# Patient Record
Sex: Male | Born: 1945 | Race: White | Hispanic: No | Marital: Married | Smoking: Former smoker
Health system: Southern US, Community
[De-identification: ages and names within clinical notes are randomized; demographics above are authoritative.]

## PROBLEM LIST (undated history)

## (undated) DIAGNOSIS — G2581 Restless legs syndrome: Secondary | ICD-10-CM

## (undated) DIAGNOSIS — E119 Type 2 diabetes mellitus without complications: Secondary | ICD-10-CM

## (undated) DIAGNOSIS — M1990 Unspecified osteoarthritis, unspecified site: Secondary | ICD-10-CM

## (undated) DIAGNOSIS — I219 Acute myocardial infarction, unspecified: Secondary | ICD-10-CM

## (undated) DIAGNOSIS — I2510 Atherosclerotic heart disease of native coronary artery without angina pectoris: Secondary | ICD-10-CM

## (undated) DIAGNOSIS — M79671 Pain in right foot: Secondary | ICD-10-CM

## (undated) DIAGNOSIS — G4700 Insomnia, unspecified: Principal | ICD-10-CM

## (undated) DIAGNOSIS — F329 Major depressive disorder, single episode, unspecified: Secondary | ICD-10-CM

## (undated) DIAGNOSIS — I10 Essential (primary) hypertension: Secondary | ICD-10-CM

## (undated) DIAGNOSIS — G629 Polyneuropathy, unspecified: Secondary | ICD-10-CM

## (undated) DIAGNOSIS — M4807 Spinal stenosis, lumbosacral region: Secondary | ICD-10-CM

## (undated) DIAGNOSIS — F32A Depression, unspecified: Secondary | ICD-10-CM

## (undated) DIAGNOSIS — M109 Gout, unspecified: Secondary | ICD-10-CM

## (undated) DIAGNOSIS — I639 Cerebral infarction, unspecified: Secondary | ICD-10-CM

## (undated) DIAGNOSIS — M79672 Pain in left foot: Secondary | ICD-10-CM

## (undated) HISTORY — PX: EYE SURGERY: SHX253

## (undated) HISTORY — PX: BACK SURGERY: SHX140

## (undated) HISTORY — DX: Pain in right foot: M79.671

## (undated) HISTORY — DX: Insomnia, unspecified: G47.00

## (undated) HISTORY — DX: Spinal stenosis, lumbosacral region: M48.07

## (undated) HISTORY — DX: Type 2 diabetes mellitus without complications: E11.9

## (undated) HISTORY — PX: HAND SURGERY: SHX662

## (undated) HISTORY — DX: Restless legs syndrome: G25.81

## (undated) HISTORY — DX: Pain in right foot: M79.672

## (undated) HISTORY — PX: CARDIAC CATHETERIZATION: SHX172

## (undated) HISTORY — DX: Gout, unspecified: M10.9

## (undated) HISTORY — PX: CORONARY ARTERY BYPASS GRAFT: SHX141

## (undated) HISTORY — DX: Atherosclerotic heart disease of native coronary artery without angina pectoris: I25.10

## (undated) HISTORY — DX: Acute myocardial infarction, unspecified: I21.9

## (undated) HISTORY — PX: HERNIA REPAIR: SHX51

---

## 2003-10-24 ENCOUNTER — Ambulatory Visit: Payer: Self-pay | Admitting: Pain Medicine

## 2003-11-01 ENCOUNTER — Ambulatory Visit: Payer: Self-pay | Admitting: Pain Medicine

## 2003-12-12 ENCOUNTER — Ambulatory Visit: Payer: Self-pay | Admitting: Pain Medicine

## 2004-04-25 ENCOUNTER — Ambulatory Visit: Payer: Self-pay | Admitting: Pain Medicine

## 2004-07-16 ENCOUNTER — Ambulatory Visit: Payer: Self-pay | Admitting: Pain Medicine

## 2004-07-31 ENCOUNTER — Ambulatory Visit: Payer: Self-pay | Admitting: Pain Medicine

## 2004-09-10 ENCOUNTER — Ambulatory Visit: Payer: Self-pay | Admitting: Pain Medicine

## 2004-10-15 ENCOUNTER — Ambulatory Visit: Payer: Self-pay | Admitting: Pain Medicine

## 2004-10-30 ENCOUNTER — Ambulatory Visit: Payer: Self-pay | Admitting: Pain Medicine

## 2004-11-12 ENCOUNTER — Ambulatory Visit: Payer: Self-pay | Admitting: Pain Medicine

## 2004-11-28 ENCOUNTER — Ambulatory Visit: Payer: Self-pay | Admitting: Pain Medicine

## 2004-12-31 ENCOUNTER — Ambulatory Visit: Payer: Self-pay | Admitting: Pain Medicine

## 2005-02-05 ENCOUNTER — Ambulatory Visit: Payer: Self-pay | Admitting: Pain Medicine

## 2005-03-06 ENCOUNTER — Ambulatory Visit: Payer: Self-pay | Admitting: Pain Medicine

## 2005-04-01 ENCOUNTER — Ambulatory Visit: Payer: Self-pay | Admitting: Pain Medicine

## 2005-04-16 ENCOUNTER — Ambulatory Visit: Payer: Self-pay | Admitting: Neurology

## 2005-05-02 ENCOUNTER — Encounter: Admission: RE | Admit: 2005-05-02 | Discharge: 2005-05-02 | Payer: Self-pay | Admitting: Neurology

## 2005-05-19 ENCOUNTER — Encounter: Admission: RE | Admit: 2005-05-19 | Discharge: 2005-05-19 | Payer: Self-pay | Admitting: Neurology

## 2005-09-02 ENCOUNTER — Encounter: Admission: RE | Admit: 2005-09-02 | Discharge: 2005-09-02 | Payer: Self-pay | Admitting: Neurology

## 2006-04-27 ENCOUNTER — Encounter: Admission: RE | Admit: 2006-04-27 | Discharge: 2006-04-27 | Payer: Self-pay | Admitting: Neurology

## 2006-05-13 ENCOUNTER — Encounter: Admission: RE | Admit: 2006-05-13 | Discharge: 2006-05-13 | Payer: Self-pay | Admitting: Neurology

## 2006-09-06 ENCOUNTER — Emergency Department: Payer: Self-pay | Admitting: Emergency Medicine

## 2006-09-06 ENCOUNTER — Other Ambulatory Visit: Payer: Self-pay

## 2008-09-04 ENCOUNTER — Observation Stay: Payer: Self-pay | Admitting: Cardiology

## 2008-10-16 ENCOUNTER — Emergency Department: Payer: Self-pay | Admitting: Unknown Physician Specialty

## 2009-03-06 ENCOUNTER — Ambulatory Visit: Payer: Self-pay | Admitting: Otolaryngology

## 2009-03-15 ENCOUNTER — Ambulatory Visit: Payer: Self-pay | Admitting: Otolaryngology

## 2009-08-06 ENCOUNTER — Ambulatory Visit: Payer: Self-pay | Admitting: Otolaryngology

## 2009-08-23 ENCOUNTER — Ambulatory Visit: Payer: Self-pay | Admitting: Otolaryngology

## 2010-02-10 ENCOUNTER — Encounter: Payer: Self-pay | Admitting: Neurology

## 2010-09-10 ENCOUNTER — Ambulatory Visit: Payer: Self-pay | Admitting: General Surgery

## 2011-11-14 ENCOUNTER — Other Ambulatory Visit: Payer: Self-pay | Admitting: Neurosurgery

## 2011-11-14 DIAGNOSIS — M5410 Radiculopathy, site unspecified: Secondary | ICD-10-CM

## 2011-11-14 DIAGNOSIS — M549 Dorsalgia, unspecified: Secondary | ICD-10-CM

## 2011-11-18 ENCOUNTER — Ambulatory Visit
Admission: RE | Admit: 2011-11-18 | Discharge: 2011-11-18 | Disposition: A | Discharge status: Home / Self Care | Payer: Medicare Other | Source: Ambulatory Visit | Attending: Neurosurgery | Admitting: Neurosurgery

## 2011-11-18 VITALS — BP 164/77 | HR 82 | Ht 71.0 in | Wt 215.0 lb

## 2011-11-18 DIAGNOSIS — M549 Dorsalgia, unspecified: Secondary | ICD-10-CM

## 2011-11-18 DIAGNOSIS — M5410 Radiculopathy, site unspecified: Secondary | ICD-10-CM

## 2011-11-18 MED ORDER — IOHEXOL 180 MG/ML  SOLN: 15.0000 mL | Freq: Once | INTRAMUSCULAR | Status: AC | PRN

## 2011-11-18 MED ORDER — DIAZEPAM 5 MG PO TABS
10.0000 mg | ORAL_TABLET | Freq: Once | ORAL | Status: AC
  Administered 2011-11-18: 10 mg via ORAL

## 2011-11-18 MED ORDER — MEPERIDINE HCL 100 MG/ML IJ SOLN
75.0000 mg | Freq: Once | INTRAMUSCULAR | Status: AC
  Administered 2011-11-18: 75 mg via INTRAMUSCULAR

## 2011-11-18 MED ORDER — ONDANSETRON HCL 4 MG/2ML IJ SOLN
4.0000 mg | Freq: Once | INTRAMUSCULAR | Status: AC
  Administered 2011-11-18: 4 mg via INTRAMUSCULAR

## 2011-11-18 NOTE — Progress Notes (Signed)
Patient states he has been off Plavix the past ten days and Cymbalta the past four days.  Donell Sievert, RN

## 2011-11-19 ENCOUNTER — Other Ambulatory Visit: Payer: Self-pay | Admitting: Neurosurgery

## 2011-11-19 ENCOUNTER — Encounter (HOSPITAL_COMMUNITY): Payer: Self-pay | Admitting: Pharmacy Technician

## 2011-11-20 NOTE — Pre-Procedure Instructions (Signed)
20 Erion Lovan  11/20/2011   Your procedure is scheduled on: Tuesday, November 5th   Report to [facility] at  1:30 PM. (as per surgeon's instructions)   Call this number if you have problems the morning of surgery: [phone]   Remember:   Do not eat food or drink any liquids:After Midnight Monday going into Tuesday.    Take these medicines the morning of surgery with A SIP OF WATER: Cymbalta, vicodin, metoprolol    Do not wear jewelry.  Do not wear lotions, powders, or colognes.  You [place] wear NOT deodorant.             Men Lupe shave face and neck.   Do not bring valuables to the hospital.  Contacts, dentures or bridgework Vitolo not be worn into surgery.   Leave suitcase in the car. After surgery it Bresee be brought to your room.  For patients admitted to the hospital, checkout time is 11:00 AM the day of discharge.   Patients discharged the day of surgery will not be allowed to drive home.   Name and phone number of your driver:    Special Instructions: Shower using CHG 2 nights before surgery and the night before surgery.  If you shower the day of surgery use CHG.  Use special wash - you have one bottle of CHG for all showers.  You should use approximately 1/3 of the bottle for each shower.   Please read over the following fact sheets that you were given: Pain Booklet, MRSA Information and Surgical Site Infection Prevention

## 2011-11-21 ENCOUNTER — Encounter (HOSPITAL_COMMUNITY): Payer: Self-pay | Admitting: Vascular Surgery

## 2011-11-21 ENCOUNTER — Encounter (HOSPITAL_COMMUNITY)
Admission: RE | Admit: 2011-11-21 | Discharge: 2011-11-21 | Disposition: A | Discharge status: Home / Self Care | Payer: Medicare Other | Source: Ambulatory Visit | Attending: Neurosurgery | Admitting: Neurosurgery

## 2011-11-21 ENCOUNTER — Encounter (HOSPITAL_COMMUNITY): Payer: Self-pay

## 2011-11-21 HISTORY — DX: Major depressive disorder, single episode, unspecified: F32.9

## 2011-11-21 HISTORY — DX: Polyneuropathy, unspecified: G62.9

## 2011-11-21 HISTORY — DX: Unspecified osteoarthritis, unspecified site: M19.90

## 2011-11-21 HISTORY — DX: Cerebral infarction, unspecified: I63.9

## 2011-11-21 HISTORY — DX: Depression, unspecified: F32.A

## 2011-11-21 LAB — COMPREHENSIVE METABOLIC PANEL
Alkaline Phosphatase: 59 U/L (ref 39–117)
Calcium: 10.9 mg/dL — ABNORMAL HIGH (ref 8.4–10.5)
Creatinine, Ser: 0.88 mg/dL (ref 0.50–1.35)
GFR calc Af Amer: >90 mL/min (ref >90)
GFR calc non Af Amer: 89 mL/min — ABNORMAL LOW (ref >90)
Total Bilirubin: 0.3 mg/dL (ref 0.3–1.2)
Total Protein: 7.5 g/dL (ref 6.0–8.3)

## 2011-11-21 LAB — CBC
HCT: 40 % (ref 39.0–52.0)
Hemoglobin: 14 g/dL (ref 13.0–17.0)
MCH: 34.4 pg — ABNORMAL HIGH (ref 26.0–34.0)
MCHC: 35 g/dL (ref 30.0–36.0)
WBC: 9.5 10*3/uL (ref 4.0–10.5)

## 2011-11-21 NOTE — Consult Note (Addendum)
Anesthesia consult: Patient is a 66 year old male scheduled for L4-5 discectomy, cages, pedicle screws, posterior lateral arthrodesis on 11/25/2011 by Dr. Jeral Fruit. 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  PCP is Dr. Sela Hua.  Cardiologist is Dr. Lady Gary at [facility].  Currently, he does not think either are aware of his planned surgery.    I saw patient today during his PAT visit due to cardiac history and elevated BP at his PAT visit.  Specifically, BP readings showed 208/104, 191/82, 204/81.  Just before he left his PAT visit BP was rechecked and was 198/99 (LUE) and 190/83 (RUE).  He is on metoprolol 12.5 mg BID.  He reports that his BP has never been this high.  He is not feeling very well today due to lack of sleep and feeling wired since starting his steroid taper yesterday.  He had a headache last night, which has since resolved.  He denies any visual changes.  He denies chest pain, shortness of breath, or significant edema.  He has not been able to be particularly active due due to his significant back and BLE pain.  He is taking Vicodin PRN, but it is not completely relieving the pain.  Exam show his heart with a RRR, no murmur, lungs clear, no carotid bruits noted.  Trace pre-tibial edema.  He was emotional regarding his decline in physical activity since his CABG and now even more so with back issues.  His EKG on 11/21/11 showed NSR, voltage criteria for LVH, ST/T wave abnormality, consider ischemia.  Currently, there are no comparison EKGs available.  Chest x-ray on 11/21/2011 showed no active disease, monitor changes of the thoracic spine.  CBC and CMET from 11/21/11 noted.  He will need a T&S on arrival.  Patient is aware that his BP should be better controlled  prior to his surgery.  He says these readings are really unheard of for him.  He was quite emotional, having some pain, and feeling jittery since starting his steroid taper.  These factors could all be playing a role.  He is going to have his BP rechecked by Monday 11/24/11 and was advised to consult with Dr. Arlana Pouch or Dr. Lady Gary.  He is aware that I will have to review his latest cardiology and PCP notes to determine if any additional work-up will be required from an anesthesia standpoint.  I will need to ensure his EKG is stable.  I reviewed with Anesthesiologist Dr. Michelle Piper who agreed with this plan so far.  I also updated Jessica at Dr. Cassandria Santee office.  She asked Mr. Kassel to contact her on Monday for an update regarding his hypertension.  I'll follow-up when requested records are available.  Shonna Chock, PA-C  11/21/11 1540   Addendum: 11/24/11 1025 Reviewed additional records.  Cardiac cath from 09/05/08 [facility]) showed a 90% ostial LAD lesion and normal CX and RCA.  He underwent minimally invasive direct CABG with LIMA to LAD on 09/11/08 (Dr. Jomarie Longs, [facility]).  His last visit with Cardiologist Dr. Lady Gary was on 06/20/11.  He was felt, "Stable, doing well from a cardiac standpoint.  No evidence of progression of disease clinically from his surgery.  Does have some chest wall pain but this is controlled.  No arrhythmia."  No new recommendations were made, and  6-8 months follow-up was recommended.     Echo on 10/17/08 [facility]) showed mild LV dysfunction, EF 50%, mild RV enlargement, mild LA enlargement, mild mitral insufficiency and moderate tricuspid insufficiency, mild pulmonary hypertension.    His EKG from 11/23/11 showed NSR, LVH, anterior ST/T wave abnormality, consider ischemia.  His most recent comparison EKG from [facility] has a lot of movement so it is difficult to compare anterior leads.  He did have anterior T wave abnormality noted on a prior EKG from [facility] from 10/16/08.  I  reviewed above records with Anesthesiologist Dr. Katrinka Blazing. If patient has no new CV symptoms and BP is reasonable then anticipate patient can proceed as planned.  Shanda Bumps called from Dr. Cassandria Santee office and did report that patient did see his PCP on Friday, 11/21/11, and his anti-hypertensive medication regimen was adjusted.  Reportedly, BP 176/92 and 152/82 since then.)

## 2011-11-21 NOTE — Progress Notes (Signed)
1300.Marland KitchentHIS PATIENT HAS HAD STROKE IN PAST...HE HAS A HEART CATH IN Sep 04, 2008 BY dR. kENNETH FATH (CARDIO) FROM [facility] .HAVE REQUESTED LAST OFFICE NOTES, ECHO & EKG ON THIS PT. [phone]     . AFTER HAVING THE CATH, HE HAD A CABG X 1 (WENT IN THRU THE SIDE) DONE AT [facility]. I AM ALSO REQUESTING ANY AND ALL NOTES FROM HIS PCP-=--DR DENNY TATE IN [place], [place]. [phone])....DA

## 2011-11-24 MED ORDER — CEFAZOLIN SODIUM-DEXTROSE 2-3 GM-% IV SOLR
2.0000 g | INTRAVENOUS | Status: AC
  Administered 2011-11-25: 2 g via INTRAVENOUS
  Filled 2011-11-24: qty 50

## 2011-11-25 ENCOUNTER — Inpatient Hospital Stay (HOSPITAL_COMMUNITY): Payer: Medicare Other

## 2011-11-25 ENCOUNTER — Encounter (HOSPITAL_COMMUNITY): Payer: Self-pay | Admitting: Vascular Surgery

## 2011-11-25 ENCOUNTER — Inpatient Hospital Stay (HOSPITAL_COMMUNITY): Payer: Medicare Other | Admitting: Vascular Surgery

## 2011-11-25 ENCOUNTER — Inpatient Hospital Stay (HOSPITAL_COMMUNITY)
Admission: RE | Admit: 2011-11-25 | Discharge: 2011-12-03 | DRG: 460 | Disposition: A | Discharge status: Rehabilitation Facility | Payer: Medicare Other | Source: Ambulatory Visit | Attending: Neurosurgery | Admitting: Neurosurgery

## 2011-11-25 ENCOUNTER — Encounter (HOSPITAL_COMMUNITY)
Admission: RE | Disposition: A | Discharge status: Rehabilitation Facility | Payer: Self-pay | Source: Ambulatory Visit | Attending: Neurosurgery

## 2011-11-25 ENCOUNTER — Encounter (HOSPITAL_COMMUNITY): Payer: Self-pay | Admitting: Surgery

## 2011-11-25 DIAGNOSIS — Z7902 Long term (current) use of antithrombotics/antiplatelets: Secondary | ICD-10-CM

## 2011-11-25 DIAGNOSIS — M5137 Other intervertebral disc degeneration, lumbosacral region: Principal | ICD-10-CM | POA: Diagnosis present

## 2011-11-25 DIAGNOSIS — Z01812 Encounter for preprocedural laboratory examination: Secondary | ICD-10-CM

## 2011-11-25 DIAGNOSIS — M48062 Spinal stenosis, lumbar region with neurogenic claudication: Secondary | ICD-10-CM | POA: Diagnosis present

## 2011-11-25 DIAGNOSIS — Y849 Medical procedure, unspecified as the cause of abnormal reaction of the patient, or of later complication, without mention of misadventure at the time of the procedure: Secondary | ICD-10-CM | POA: Diagnosis not present

## 2011-11-25 DIAGNOSIS — M51379 Other intervertebral disc degeneration, lumbosacral region without mention of lumbar back pain or lower extremity pain: Principal | ICD-10-CM | POA: Diagnosis present

## 2011-11-25 DIAGNOSIS — Z951 Presence of aortocoronary bypass graft: Secondary | ICD-10-CM

## 2011-11-25 DIAGNOSIS — G988 Other disorders of nervous system: Secondary | ICD-10-CM | POA: Diagnosis not present

## 2011-11-25 HISTORY — PX: LUMBAR FUSION: SHX111

## 2011-11-25 HISTORY — DX: Essential (primary) hypertension: I10

## 2011-11-25 LAB — TYPE AND SCREEN
ABO/RH(D): B POS
Antibody Screen: NEGATIVE

## 2011-11-25 LAB — ABO/RH: ABO/RH(D): B POS

## 2011-11-25 SURGERY — POSTERIOR LUMBAR FUSION 1 LEVEL
Anesthesia: General | Site: Spine Lumbar | Wound class: Clean

## 2011-11-25 MED ORDER — DIPHENHYDRAMINE HCL 50 MG/ML IJ SOLN: 12.5000 mg | Freq: Four times a day (QID) | INTRAMUSCULAR | Status: DC | PRN

## 2011-11-25 MED ORDER — HYDROMORPHONE HCL PF 1 MG/ML IJ SOLN
0.2500 mg | INTRAMUSCULAR | Status: DC | PRN
  Administered 2011-11-25 (×4): 0.5 mg via INTRAVENOUS

## 2011-11-25 MED ORDER — PROPOFOL 10 MG/ML IV BOLUS
INTRAVENOUS | Status: DC | PRN
  Administered 2011-11-25: 150 mg via INTRAVENOUS
  Administered 2011-11-25: 50 mg via INTRAVENOUS

## 2011-11-25 MED ORDER — LAMOTRIGINE 25 MG PO TABS
50.0000 mg | ORAL_TABLET | Freq: Two times a day (BID) | ORAL | Status: DC
  Administered 2011-11-26 – 2011-12-03 (×15): 50 mg via ORAL
  Filled 2011-11-25 (×18): qty 2

## 2011-11-25 MED ORDER — METOPROLOL SUCCINATE ER 25 MG PO TB24
25.0000 mg | ORAL_TABLET | Freq: Two times a day (BID) | ORAL | Status: DC
  Administered 2011-11-26 – 2011-12-03 (×15): 25 mg via ORAL
  Filled 2011-11-25 (×17): qty 1

## 2011-11-25 MED ORDER — PHENYLEPHRINE HCL 10 MG/ML IJ SOLN
INTRAMUSCULAR | Status: DC | PRN
  Administered 2011-11-25: 40 ug via INTRAVENOUS
  Administered 2011-11-25: 80 ug via INTRAVENOUS
  Administered 2011-11-25: 40 ug via INTRAVENOUS
  Administered 2011-11-25: 80 ug via INTRAVENOUS
  Administered 2011-11-25 (×2): 40 ug via INTRAVENOUS

## 2011-11-25 MED ORDER — DIAZEPAM 5 MG PO TABS
5.0000 mg | ORAL_TABLET | Freq: Four times a day (QID) | ORAL | Status: DC | PRN
  Administered 2011-11-26: 5 mg via ORAL
  Filled 2011-11-25: qty 1

## 2011-11-25 MED ORDER — DIPHENHYDRAMINE HCL 12.5 MG/5ML PO ELIX: 12.5000 mg | ORAL_SOLUTION | Freq: Four times a day (QID) | ORAL | Status: DC | PRN

## 2011-11-25 MED ORDER — ONDANSETRON HCL 4 MG/2ML IJ SOLN: 4.0000 mg | Freq: Once | INTRAMUSCULAR | Status: DC | PRN

## 2011-11-25 MED ORDER — VECURONIUM BROMIDE 10 MG IV SOLR
INTRAVENOUS | Status: DC | PRN
  Administered 2011-11-25 (×2): 1 mg via INTRAVENOUS
  Administered 2011-11-25: 2 mg via INTRAVENOUS
  Administered 2011-11-25: 8 mg via INTRAVENOUS
  Administered 2011-11-25 (×3): 2 mg via INTRAVENOUS

## 2011-11-25 MED ORDER — SODIUM CHLORIDE 0.9 % IJ SOLN: 9.0000 mL | INTRAMUSCULAR | Status: DC | PRN

## 2011-11-25 MED ORDER — ACETAMINOPHEN 650 MG RE SUPP: 650.0000 mg | RECTAL | Status: DC | PRN

## 2011-11-25 MED ORDER — LACTATED RINGERS IV SOLN
INTRAVENOUS | Status: DC | PRN
  Administered 2011-11-25 (×5): via INTRAVENOUS

## 2011-11-25 MED ORDER — HYDROMORPHONE HCL PF 1 MG/ML IJ SOLN
INTRAMUSCULAR | Status: AC
  Filled 2011-11-25: qty 1

## 2011-11-25 MED ORDER — NEOSTIGMINE METHYLSULFATE 1 MG/ML IJ SOLN
INTRAMUSCULAR | Status: DC | PRN
  Administered 2011-11-25: 4 mg via INTRAVENOUS

## 2011-11-25 MED ORDER — MORPHINE SULFATE (PF) 1 MG/ML IV SOLN
INTRAVENOUS | Status: AC
  Administered 2011-11-26: 06:00:00
  Filled 2011-11-25: qty 25

## 2011-11-25 MED ORDER — ZOLPIDEM TARTRATE 10 MG PO TABS: 10.0000 mg | ORAL_TABLET | Freq: Every evening | ORAL | Status: DC | PRN

## 2011-11-25 MED ORDER — MORPHINE SULFATE (PF) 1 MG/ML IV SOLN
INTRAVENOUS | Status: DC
  Administered 2011-11-25: 21:00:00 via INTRAVENOUS
  Administered 2011-11-26: 16.5 mg via INTRAVENOUS
  Administered 2011-11-26: 10.5 mg via INTRAVENOUS
  Administered 2011-11-26: 12:00:00 via INTRAVENOUS
  Administered 2011-11-26: 7.5 mg via INTRAVENOUS
  Administered 2011-11-26: 10.5 mg via INTRAVENOUS
  Administered 2011-11-27: 01:00:00 via INTRAVENOUS
  Administered 2011-11-27: 1.5 mg via INTRAVENOUS
  Filled 2011-11-25 (×3): qty 25

## 2011-11-25 MED ORDER — NALOXONE HCL 0.4 MG/ML IJ SOLN: 0.4000 mg | INTRAMUSCULAR | Status: DC | PRN

## 2011-11-25 MED ORDER — ARTIFICIAL TEARS OP OINT
TOPICAL_OINTMENT | OPHTHALMIC | Status: DC | PRN
  Administered 2011-11-25: 1 via OPHTHALMIC

## 2011-11-25 MED ORDER — GLYCOPYRROLATE 0.2 MG/ML IJ SOLN
INTRAMUSCULAR | Status: DC | PRN
Start: 2011-11-25 — End: 2011-11-25
  Administered 2011-11-25: 0.2 mg via INTRAVENOUS
  Administered 2011-11-25: 0.6 mg via INTRAVENOUS
  Administered 2011-11-25: 0.2 mg via INTRAVENOUS

## 2011-11-25 MED ORDER — SIMVASTATIN 40 MG PO TABS
40.0000 mg | ORAL_TABLET | Freq: Every day | ORAL | Status: DC
  Administered 2011-11-26 – 2011-12-02 (×7): 40 mg via ORAL
  Filled 2011-11-25 (×10): qty 1

## 2011-11-25 MED ORDER — MENTHOL 3 MG MT LOZG: 1.0000 | LOZENGE | OROMUCOSAL | Status: DC | PRN

## 2011-11-25 MED ORDER — SODIUM CHLORIDE 0.9 % IV SOLN
INTRAVENOUS | Status: DC | PRN
  Administered 2011-11-25: 20:00:00 via INTRAVENOUS

## 2011-11-25 MED ORDER — THROMBIN 20000 UNITS EX SOLR
CUTANEOUS | Status: DC | PRN
  Administered 2011-11-25: 18:00:00 via TOPICAL

## 2011-11-25 MED ORDER — ALBUMIN HUMAN 5 % IV SOLN
INTRAVENOUS | Status: DC | PRN
  Administered 2011-11-25 (×2): via INTRAVENOUS

## 2011-11-25 MED ORDER — DULOXETINE HCL 30 MG PO CPEP
30.0000 mg | ORAL_CAPSULE | Freq: Two times a day (BID) | ORAL | Status: DC
  Filled 2011-11-25: qty 2

## 2011-11-25 MED ORDER — FENOFIBRATE 160 MG PO TABS
160.0000 mg | ORAL_TABLET | Freq: Every day | ORAL | Status: DC
  Administered 2011-11-26 – 2011-12-03 (×8): 160 mg via ORAL
  Filled 2011-11-25 (×8): qty 1

## 2011-11-25 MED ORDER — CEFAZOLIN SODIUM 1-5 GM-% IV SOLN
1.0000 g | Freq: Three times a day (TID) | INTRAVENOUS | Status: AC
  Administered 2011-11-26 (×2): 1 g via INTRAVENOUS
  Filled 2011-11-25 (×2): qty 50

## 2011-11-25 MED ORDER — SODIUM CHLORIDE 0.9 % IV SOLN
INTRAVENOUS | Status: DC
  Administered 2011-11-26 – 2011-11-27 (×2): via INTRAVENOUS

## 2011-11-25 MED ORDER — ONDANSETRON HCL 4 MG/2ML IJ SOLN: 4.0000 mg | INTRAMUSCULAR | Status: DC | PRN

## 2011-11-25 MED ORDER — FENTANYL CITRATE 0.05 MG/ML IJ SOLN
INTRAMUSCULAR | Status: DC | PRN
  Administered 2011-11-25 (×8): 50 ug via INTRAVENOUS
  Administered 2011-11-25: 100 ug via INTRAVENOUS

## 2011-11-25 MED ORDER — SODIUM CHLORIDE 0.9 % IV SOLN: 250.0000 mL | INTRAVENOUS | Status: DC

## 2011-11-25 MED ORDER — DULOXETINE HCL 60 MG PO CPEP
60.0000 mg | ORAL_CAPSULE | Freq: Every day | ORAL | Status: DC
  Administered 2011-11-26 – 2011-12-03 (×8): 60 mg via ORAL
  Filled 2011-11-25 (×9): qty 1

## 2011-11-25 MED ORDER — ONDANSETRON HCL 4 MG/2ML IJ SOLN: 4.0000 mg | Freq: Four times a day (QID) | INTRAMUSCULAR | Status: DC | PRN

## 2011-11-25 MED ORDER — ACETAMINOPHEN 325 MG PO TABS
650.0000 mg | ORAL_TABLET | ORAL | Status: DC | PRN
  Administered 2011-11-29 – 2011-11-30 (×4): 650 mg via ORAL
  Administered 2011-11-30: 325 mg via ORAL
  Administered 2011-12-01 (×2): 650 mg via ORAL
  Filled 2011-11-25 (×3): qty 1
  Filled 2011-11-25 (×5): qty 2

## 2011-11-25 MED ORDER — ACETAMINOPHEN 10 MG/ML IV SOLN
INTRAVENOUS | Status: AC
  Filled 2011-11-25: qty 100

## 2011-11-25 MED ORDER — MIDAZOLAM HCL 5 MG/5ML IJ SOLN
INTRAMUSCULAR | Status: DC | PRN
  Administered 2011-11-25: 2 mg via INTRAVENOUS

## 2011-11-25 MED ORDER — 0.9 % SODIUM CHLORIDE (POUR BTL) OPTIME
TOPICAL | Status: DC | PRN
  Administered 2011-11-25: 1000 mL

## 2011-11-25 MED ORDER — PHENOL 1.4 % MT LIQD: 1.0000 | OROMUCOSAL | Status: DC | PRN

## 2011-11-25 MED ORDER — OXYCODONE-ACETAMINOPHEN 5-325 MG PO TABS
1.0000 | ORAL_TABLET | ORAL | Status: DC | PRN
  Administered 2011-11-26 – 2011-11-27 (×3): 2 via ORAL
  Filled 2011-11-25 (×3): qty 2

## 2011-11-25 MED ORDER — ONDANSETRON HCL 4 MG/2ML IJ SOLN
INTRAMUSCULAR | Status: DC | PRN
  Administered 2011-11-25: 4 mg via INTRAVENOUS

## 2011-11-25 MED ORDER — SODIUM CHLORIDE 0.9 % IJ SOLN: 3.0000 mL | INTRAMUSCULAR | Status: DC | PRN

## 2011-11-25 MED ORDER — ACETAMINOPHEN 10 MG/ML IV SOLN
1000.0000 mg | Freq: Once | INTRAVENOUS | Status: AC | PRN
  Administered 2011-11-25: 1000 mg via INTRAVENOUS

## 2011-11-25 MED ORDER — DULOXETINE HCL 30 MG PO CPEP
30.0000 mg | ORAL_CAPSULE | Freq: Every day | ORAL | Status: DC
  Administered 2011-11-26 – 2011-12-03 (×7): 30 mg via ORAL
  Filled 2011-11-25 (×8): qty 1

## 2011-11-25 MED ORDER — SODIUM CHLORIDE 0.9 % IJ SOLN: 3.0000 mL | Freq: Two times a day (BID) | INTRAMUSCULAR | Status: DC

## 2011-11-25 SURGICAL SUPPLY — 72 items
ADH SKN CLS LQ APL DERMABOND (GAUZE/BANDAGES/DRESSINGS) ×1
BENZOIN TINCTURE PRP APPL 2/3 (GAUZE/BANDAGES/DRESSINGS) ×2 IMPLANT
BLADE SURG ROTATE 9660 (MISCELLANEOUS) IMPLANT
BONE EQUIVA 10CC (Bone Implant) ×2 IMPLANT
BUR ACORN 6.0 (BURR) ×2 IMPLANT
BUR MATCHSTICK NEURO 3.0 LAGG (BURR) ×2 IMPLANT
CANISTER SUCTION 2500CC (MISCELLANEOUS) ×2 IMPLANT
CAP REVERE LOCKING (Cap) ×8 IMPLANT
CLOTH BEACON ORANGE TIMEOUT ST (SAFETY) ×2 IMPLANT
CONT SPEC 4OZ CLIKSEAL STRL BL (MISCELLANEOUS) ×4 IMPLANT
COVER BACK TABLE 24X17X13 BIG (DRAPES) IMPLANT
COVER TABLE BACK 60X90 (DRAPES) ×2 IMPLANT
DERMABOND ADHESIVE PROPEN (GAUZE/BANDAGES/DRESSINGS) ×1
DERMABOND ADVANCED .7 DNX6 (GAUZE/BANDAGES/DRESSINGS) ×1 IMPLANT
DRAPE C-ARM 42X72 X-RAY (DRAPES) ×6 IMPLANT
DRAPE LAPAROTOMY 100X72X124 (DRAPES) ×2 IMPLANT
DRAPE POUCH INSTRU U-SHP 10X18 (DRAPES) ×2 IMPLANT
DRSG PAD ABDOMINAL 8X10 ST (GAUZE/BANDAGES/DRESSINGS) ×4 IMPLANT
DURAPREP 26ML APPLICATOR (WOUND CARE) ×2 IMPLANT
DURASEAL SPINE SEALANT 3ML (MISCELLANEOUS) ×2 IMPLANT
ELECT BLADE 4.0 EZ CLEAN MEGAD (MISCELLANEOUS) ×2
ELECT REM PT RETURN 9FT ADLT (ELECTROSURGICAL) ×2
ELECTRODE BLDE 4.0 EZ CLN MEGD (MISCELLANEOUS) ×1 IMPLANT
ELECTRODE REM PT RTRN 9FT ADLT (ELECTROSURGICAL) ×1 IMPLANT
EVACUATOR 1/8 PVC DRAIN (DRAIN) IMPLANT
GAUZE SPONGE 4X4 16PLY XRAY LF (GAUZE/BANDAGES/DRESSINGS) ×2 IMPLANT
GLOVE BIO SURGEON STRL SZ 6.5 (GLOVE) ×10 IMPLANT
GLOVE BIOGEL M 8.0 STRL (GLOVE) ×4 IMPLANT
GLOVE BIOGEL PI IND STRL 7.0 (GLOVE) ×2 IMPLANT
GLOVE BIOGEL PI INDICATOR 7.0 (GLOVE) ×2
GLOVE ECLIPSE 8.5 STRL (GLOVE) ×2 IMPLANT
GLOVE EXAM NITRILE LRG STRL (GLOVE) IMPLANT
GLOVE EXAM NITRILE MD LF STRL (GLOVE) IMPLANT
GLOVE EXAM NITRILE XL STR (GLOVE) IMPLANT
GLOVE EXAM NITRILE XS STR PU (GLOVE) IMPLANT
GOWN BRE IMP SLV AUR LG STRL (GOWN DISPOSABLE) ×6 IMPLANT
GOWN BRE IMP SLV AUR XL STRL (GOWN DISPOSABLE) ×4 IMPLANT
GOWN STRL REIN 2XL LVL4 (GOWN DISPOSABLE) IMPLANT
KIT BASIN OR (CUSTOM PROCEDURE TRAY) ×2 IMPLANT
KIT ROOM TURNOVER OR (KITS) ×2 IMPLANT
MILL MEDIUM DISP (BLADE) ×2 IMPLANT
NEEDLE HYPO 18GX1.5 BLUNT FILL (NEEDLE) IMPLANT
NEEDLE HYPO 21X1.5 SAFETY (NEEDLE) IMPLANT
NEEDLE HYPO 25X1 1.5 SAFETY (NEEDLE) ×2 IMPLANT
NS IRRIG 1000ML POUR BTL (IV SOLUTION) ×2 IMPLANT
PACK LAMINECTOMY NEURO (CUSTOM PROCEDURE TRAY) ×2 IMPLANT
PAD ARMBOARD 7.5X6 YLW CONV (MISCELLANEOUS) ×6 IMPLANT
PATTIES SURGICAL .5 X1 (DISPOSABLE) ×2 IMPLANT
PATTIES SURGICAL .5 X3 (DISPOSABLE) IMPLANT
PATTIES SURGICAL 1X1 (DISPOSABLE) ×2 IMPLANT
PENCIL BUTTON HOLSTER BLD 10FT (ELECTRODE) ×2 IMPLANT
ROD REVERE 6.35 40MM (Rod) ×4 IMPLANT
SCREW REVERE 5.5X45 (Screw) ×8 IMPLANT
SPACER SUSTAIN O SM 8X22 10M (Spacer) ×4 IMPLANT
SPONGE GAUZE 4X4 12PLY (GAUZE/BANDAGES/DRESSINGS) ×2 IMPLANT
SPONGE LAP 4X18 X RAY DECT (DISPOSABLE) IMPLANT
SPONGE NEURO XRAY DETECT 1X3 (DISPOSABLE) IMPLANT
SPONGE SURGIFOAM ABS GEL 100 (HEMOSTASIS) ×2 IMPLANT
STRIP CLOSURE SKIN 1/2X4 (GAUZE/BANDAGES/DRESSINGS) ×2 IMPLANT
SUT PROLENE 6 0 BV (SUTURE) ×2 IMPLANT
SUT VIC AB 1 CT1 18XBRD ANBCTR (SUTURE) ×3 IMPLANT
SUT VIC AB 1 CT1 8-18 (SUTURE) ×6
SUT VIC AB 2-0 CP2 18 (SUTURE) ×2 IMPLANT
SUT VIC AB 3-0 SH 8-18 (SUTURE) ×2 IMPLANT
SYR 20CC LL (SYRINGE) IMPLANT
SYR 20ML ECCENTRIC (SYRINGE) ×2 IMPLANT
SYR 5ML LL (SYRINGE) IMPLANT
TAPE CLOTH SURG 4X10 WHT LF (GAUZE/BANDAGES/DRESSINGS) ×2 IMPLANT
TOWEL OR 17X24 6PK STRL BLUE (TOWEL DISPOSABLE) ×2 IMPLANT
TOWEL OR 17X26 10 PK STRL BLUE (TOWEL DISPOSABLE) ×2 IMPLANT
TRAY FOLEY CATH 14FRSI W/METER (CATHETERS) ×2 IMPLANT
WATER STERILE IRR 1000ML POUR (IV SOLUTION) ×2 IMPLANT

## 2011-11-25 NOTE — Preoperative (Signed)
Beta Blockers   Reason not to administer Beta Blockers:Not Applicable 

## 2011-11-25 NOTE — Anesthesia Postprocedure Evaluation (Signed)
  Anesthesia Post-op Note  Patient: [name] [name]  Procedure(s) Performed: Procedure(s) (LRB) with comments: POSTERIOR LUMBAR FUSION 1 LEVEL (N/A) - Lumbar four-five Diskectomy,cages, pedicle screws, posterolateral arthrodesis, cellsaver  Patient Location: PACU  Anesthesia Type:General  Level of Consciousness: awake  Airway and Oxygen Therapy: Patient Spontanous Breathing  Post-op Pain: mild  Post-op Assessment: Post-op Vital signs reviewed  Post-op Vital Signs: Reviewed  Complications: No apparent anesthesia complications

## 2011-11-25 NOTE — H&P (Signed)
[name] [name] is an 66 y.o. male.   Chief Complaint: lumbar pain HPI: patient seen with history of lumbar pain with radiation to both lower extremities which is getting worse up to the point his wife helps him to dress   Past Medical History  Diagnosis Date  . Arthritis   . Depression     STARTED AFTER  OPEN HEART SURG.  . Stroke     2008  RIGHT SIDE...HAD FOR A COUPLE OF DAYS AND THEN WENT AWAY  . Stroke     WENT TO [place]  2008  . Neuropathy     FIBRO        SEES  DR. Corrinne Eagle  . Coronary artery disease     Past Surgical History  Procedure Date  . Coronary artery bypass graft     (2010 @ [facility]  . Hand surgery     CRUSHING INJURY---1966  HAS ABOUT 6 SURGERIES THEN  . Hernia repair     BILATERAL  . Eye surgery     ?? BLEPHOPLASTY    No family history on file. Social History:  reports that he quit smoking about 5 years ago. He started smoking about 43 years ago. He does not have any smokeless tobacco history on file. He reports that he drinks about 7.2 ounces of alcohol per week. He reports that he does not use illicit drugs.  Allergies: No Known Allergies  No prescriptions prior to admission    No results found for this or any previous visit (from the past 48 hour(s)). No results found.  Review of Systems  Constitutional: Negative.   HENT: Negative.   Respiratory: Negative.   Cardiovascular:       Arterial hypertension  Gastrointestinal: Negative.   Genitourinary: Negative.   Musculoskeletal: Positive for back pain.  Skin: Negative.   Neurological: Positive for focal weakness.  Endo/Heme/Allergies: Negative.   Psychiatric/Behavioral: Positive for depression.    There were no vitals taken for this visit. Physical Exam hent, nl neck, nl. Lungs, clear. Cv, scra in midline from bypass surgery. Abdomen, nl. Extremities, nl. NEURO DORSIFLEXION OF FEET 3/5 DTR, 1 X. SLR POSITIVE AT 60 DEGREES. Myelogram showed severe stenosis at l4-5 with facet arthropathy at  l5-s1but no compromise of the s1 nerve roots. scoliosis  Assessment/Plan Decompression and fusion at l4-5 with cages and pedicles screws and pla. He and his wife are aware of risks and benefits  Destyn Parfitt M 11/25/2011, 10:01 AM

## 2011-11-25 NOTE — Transfer of Care (Signed)
Immediate Anesthesia Transfer of Care Note  Patient: [name] [name]  Procedure(s) Performed: Procedure(s) (LRB) with comments: POSTERIOR LUMBAR FUSION 1 LEVEL (N/A) - Lumbar four-five Diskectomy,cages, pedicle screws, posterolateral arthrodesis, cellsaver  Patient Location: PACU  Anesthesia Type:General  Level of Consciousness: awake, alert , oriented and sedated  Airway & Oxygen Therapy: Patient Spontanous Breathing  Post-op Assessment: Report given to PACU RN, Post -op Vital signs reviewed and stable and Patient moving all extremities  Post vital signs: Reviewed and stable  Complications: No apparent anesthesia complications

## 2011-11-25 NOTE — Anesthesia Preprocedure Evaluation (Signed)
Anesthesia Evaluation  Patient identified by MRN, date of birth, ID band Patient awake    Reviewed: Allergy & Precautions, H&P , NPO status , Patient's Chart, lab work & pertinent test results  Airway Mallampati: II TM Distance: >3 FB Neck ROM: Full    Dental  (+) Teeth Intact and Dental Advisory Given   Pulmonary  breath sounds clear to auscultation        Cardiovascular Rhythm:Regular     Neuro/Psych    GI/Hepatic   Endo/Other    Renal/GU      Musculoskeletal   Abdominal   Peds  Hematology   Anesthesia Other Findings   Reproductive/Obstetrics                           Anesthesia Physical Anesthesia Plan  ASA: III  Anesthesia Plan: General   Post-op Pain Management:    Induction: Intravenous  Airway Management Planned: Oral ETT  Additional Equipment:   Intra-op Plan:   Post-operative Plan: Extubation in OR  Informed Consent: I have reviewed the patients History and Physical, chart, labs and discussed the procedure including the risks, benefits and alternatives for the proposed anesthesia with the patient or authorized representative who has indicated his/her understanding and acceptance.   Dental advisory given  Plan Discussed with: CRNA and Surgeon  Anesthesia Plan Comments: (HNP L4-5 CA S/P CABGx1 at [facility] 09/05/2008 No angina Htn S/P CVA 2008 with mild R. Sided weakness as residual H/O smoking, quit 2008  Plan GA with oral ETT  Kipp Brood,  MD)        Anesthesia Quick Evaluation

## 2011-11-25 NOTE — H&P (Signed)
[name] [name] is an 66 y.o. male.   Chief Complaint: lower bakl pain. HPI: patient who came to my office with his wife complaining of lumbar pain associated with radiation to both lower extremities which has been going for several months but is getting worse. His wife help him to dress. Had a lumbar mri and was send yo [place]  Past Medical History  Diagnosis Date  . Arthritis   . Depression     STARTED AFTER  OPEN HEART SURG.  . Stroke     2008  RIGHT SIDE...HAD FOR A COUPLE OF DAYS AND THEN WENT AWAY  . Stroke     WENT TO [place]  2008  . Neuropathy     FIBRO        SEES  DR. Corrinne Eagle  . Coronary artery disease     Past Surgical History  Procedure Date  . Coronary artery bypass graft     (2010 @ [facility]  . Hand surgery     CRUSHING INJURY---1966  HAS ABOUT 6 SURGERIES THEN  . Hernia repair     BILATERAL  . Eye surgery     ?? BLEPHOPLASTY    History reviewed. No pertinent family history. Social History:  reports that he quit smoking about 5 years ago. He started smoking about 43 years ago. He does not have any smokeless tobacco history on file. He reports that he drinks about 7.2 ounces of alcohol per week. He reports that he does not use illicit drugs.  Allergies: No Known Allergies  Medications Prior to Admission  Medication Sig Dispense Refill  . Cholecalciferol (VITAMIN D-3) 1000 UNITS CAPS Take 1 capsule by mouth daily.      . Choline Fenofibrate (TRILIPIX) 135 MG capsule Take 135 mg by mouth at bedtime.      . DULoxetine (CYMBALTA) 30 MG capsule Take 30-60 mg by mouth 2 (two) times daily. 2 after breakfast  1 after lunch      . HYDROcodone-acetaminophen (VICODIN) 5-500 MG per tablet Take 1 tablet by mouth daily.      [name] lamoTRIgine (LAMICTAL) 25 MG tablet Take 50 mg by mouth 2 (two) times daily. On 10/9- 1 tablet twice daily On 10/23-2 tablets twice daily On 11/6-3 tablets three times daily      . metoprolol succinate (TOPROL-XL) 25 MG 24 hr tablet Take 25 mg by mouth 2  (two) times daily.       [name] OVER THE COUNTER MEDICATION Place 1-2 drops into both eyes daily. Multi Action Relief Lubricant Eye Drop OTC      . predniSONE (DELTASONE) 10 MG tablet Take 10-60 mg by mouth See admin instructions. Starts 10/31  Tapered dose.      . simvastatin (ZOCOR) 40 MG tablet Take 40 mg by mouth at bedtime.      . triamcinolone cream (KENALOG) 0.1 % Apply 1 application topically 3 (three) times daily as needed. To affected area chest.        Results for orders placed during the hospital encounter of 11/25/11 (from the past 48 hour(s))  TYPE AND SCREEN     Status: Normal   Collection Time   11/25/11  1:40 PM      Component Value Range Comment   ABO/RH(D) B POS      Antibody Screen NEG      Sample Expiration 11/28/2011     ABO/RH     Status: Normal   Collection Time   11/25/11  1:40  PM      Component Value Range Comment   ABO/RH(D) B POS      No results found.  Review of Systems  Constitutional: Negative.   HENT: Negative.   Eyes: Negative.   Respiratory: Negative.   Cardiovascular:       Arterial hypertension  Gastrointestinal: Negative.   Genitourinary: Negative.   Musculoskeletal: Positive for back pain.  Skin: Negative.   Neurological: Positive for focal weakness.  Endo/Heme/Allergies: Negative.   Psychiatric/Behavioral: Negative.     Blood pressure 146/79, pulse 72, temperature 97.5 F (36.4 C), temperature source Oral, resp. rate 20, height 5\' 11"  (1.803 m), weight 97.2 kg (214 lb 4.6 oz), SpO2 99.00%. Physical Examhent, nl. Neck,nl. Cv,scar in chest from bypass surgery. Lungs, clear, extremities, n, NEUROweakness of DF both feet. SLR positive at 60 degrees, tingling both feet. Mri lumbar severe stenosis at l4-5 facet arthropathy. Thick ligaments    Assessment/Plandecompression anf fusion at l4-5 . He and his wife are aware of risks and benefits.  Garnett Nunziata M 11/25/2011, 4:30 PM

## 2011-11-25 NOTE — Progress Notes (Signed)
OP NOTE [id number]

## 2011-11-26 ENCOUNTER — Encounter (HOSPITAL_COMMUNITY): Payer: Self-pay | Admitting: General Practice

## 2011-11-26 MED ORDER — DIAZEPAM 5 MG PO TABS
10.0000 mg | ORAL_TABLET | Freq: Three times a day (TID) | ORAL | Status: DC
  Administered 2011-11-26: 10 mg via ORAL
  Administered 2011-11-26: 5 mg via ORAL
  Administered 2011-11-27 – 2011-11-28 (×3): 10 mg via ORAL
  Filled 2011-11-26 (×2): qty 2
  Filled 2011-11-26: qty 1
  Filled 2011-11-26 (×2): qty 2

## 2011-11-26 MED ORDER — INFLUENZA VIRUS VACC SPLIT PF IM SUSP
0.5000 mL | INTRAMUSCULAR | Status: AC
  Administered 2011-11-27: 0.5 mL via INTRAMUSCULAR
  Filled 2011-11-26: qty 0.5

## 2011-11-26 NOTE — Op Note (Signed)
NAMEYANDRIEL, BOENING                   ACCOUNT NO.:  [id number]  MEDICAL RECORD NO.:  [id number]  LOCATION:  [id number]                        FACILITY:  [facility]  PHYSICIAN:  Hilda Lias, M.D.   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  Status post coronary artery bypass.  Chronic intake of Plavix.  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  Status post coronary artery bypass.  Chronic intake of Plavix.  PROCEDURE:  L4 laminectomy, L4 facetectomy, bilateral L4-5 diskectomy medial and laterally _beyond normal_________ we do to be able to insert the cages. Decompression of the thecal sac and the L4 and L5 nerve root.  L3 laminectomy bilaterally.  Interbody fusion at L4-5 with cages.  Pedicle screws at L4-L5 with C-arm and Cell Saver.  Posterolateral arthrodesis with autograft and bone extensor.  Repair of small leak at the level of L3-4 left.  SURGEON:  Hilda Lias, MD  ASSISTANT:  Kathaleen Maser. Pool, M.D.  CLINICAL HISTORY:  Mr. Yonke is a gentleman who has a history of back pain radiating to both legs.  He had been on Plavix.  He has a coronary artery bypass.  He had an outpatient myelogram.  After that, he developed a spinal headache.  The x-ray showed that he has severe degenerative disk disease at L4-5 with stenosis.  Because of that, surgery was advised.  He and his wife knew the risk with the surgery including the risk associated with heart being off Plavix, infection, CSF leak, bleeding, need for further surgery.  PROCEDURE:  The patient was taken to the OR, and after intubation, he was positioned in a prone manner.  The back was cleaned with DuraPrep. Midline incision from L3 to L4 and L5 was made and  muscle were retracted laterally.  Although, he had been off Plavix for at least 10 days, he had quite a bit of bleeding.  Hemostasis was done with the bipolar and the Bovie.  X-ray was taken and showed the clip__________ was at the level of L4.  From then on, we removed the spinal process of L4, the lamina, and the facet.  We went laterally and then we retracted the thecal sac in the right side to do diskectomy central, then laterally _beyond normal_________ we do to be able to insert the cage.  We did the same procedure at the level of the left side L4-5, but immediately we found quite a bit of CSF.  We investigated and we were unable to find the source of the CSF leak.  Nevertheless, the leak was stopped, we will continue above procedure, and after we did the diskectomy, we introduced 2 cages of 10 x 22 with autograft and allograft inside.  The rest of the disk was filled up with the same material.  Then, using the C-arm first in AP view and then a lateral view, we probed the pedicle of L4 and L5.  We were able to set 4 screws of 5.5 x  45 in good position.  Prior to introduction of the screws, we feeled the area just to be sure__________ holes were surrounded by bone.  Then, a rod right and left were used and the __________ screws were kept in place using _screw tops_________.  We went back to investigate the area of the leak.  There was no opening whatsoever at the level of L4-5 where we did the surgery.  Then, we looked at the myelogram and the needle was right at the level of left side between 3 and 4.  Because of that, we proceeded having a hemilaminectomy of L3. We investigated the thecal sac and indeed anterolaterally there was an opening right at the takeoff of L3-L4 area.  The area has an opening at the level of the myelo site and__________ we were unable to use any stitch and we used muscle to cover the area, followed by surgical glue.  From then on, we went laterally.  We  did remove the periosteum of L4- 5 and a mix of autograft was used for arthrodesis bilateral.  Then, the area was irrigated.  Valsalva maneuver was negative.  Then, the wound was closed with multiple level of Vicryl and nylon.  The patient will go back to his room.          ______________________________ Hilda Lias, M.D.     EB/MEDQ  D:  11/25/2011  T:  11/26/2011  Job:  [id number]

## 2011-11-26 NOTE — Progress Notes (Signed)
Patient ID: [name] [name], male   DOB: 1945-12-03, 66 y.o.   MRN: [id number] Stable, c/o headache. No weakness, sensory normal VS wnl.

## 2011-11-26 NOTE — Clinical Social Work Note (Signed)
Clinical Social Work  CSW received consult for SNF. CSW reviewed chart and discussed pt with RN during progression. Currently, pt is on bed rest. Awaiting PT/OT evals for discharge recommendations. CSW will assess for SNF if appropriate. Please call with any urgent needs.   Dede Query, MSW, Theresia Majors [phone]

## 2011-11-27 MED ORDER — OXYCODONE HCL 5 MG PO TABS
15.0000 mg | ORAL_TABLET | ORAL | Status: DC | PRN
  Administered 2011-11-27 – 2011-11-28 (×7): 15 mg via ORAL
  Filled 2011-11-27 (×8): qty 3

## 2011-11-27 MED ORDER — MORPHINE SULFATE 2 MG/ML IJ SOLN
2.0000 mg | INTRAMUSCULAR | Status: DC | PRN
  Administered 2011-11-27 – 2011-11-28 (×3): 2 mg via INTRAVENOUS
  Administered 2011-11-28: 4 mg via INTRAVENOUS
  Administered 2011-11-28: 2 mg via INTRAVENOUS
  Filled 2011-11-27 (×2): qty 1
  Filled 2011-11-27: qty 2
  Filled 2011-11-27 (×2): qty 1

## 2011-11-27 NOTE — Progress Notes (Signed)
Patient complain about severe pain in his lower back. Dr. Was called, order for morphine given. Patient was also given his regularly scheduled and PRN meds. Will continue to monitor.

## 2011-11-27 NOTE — Progress Notes (Signed)
Patient ID: [name] [name], male   DOB: [birth year], 66 y.o.   MRN: [id number] Resting, decrease of incisional pain. No headache.

## 2011-11-28 MED ORDER — BISACODYL 10 MG RE SUPP: 10.0000 mg | Freq: Every day | RECTAL | Status: DC | PRN

## 2011-11-28 MED ORDER — FLEET ENEMA 7-19 GM/118ML RE ENEM: 1.0000 | ENEMA | Freq: Every day | RECTAL | Status: DC | PRN

## 2011-11-28 MED ORDER — DIAZEPAM 5 MG PO TABS
5.0000 mg | ORAL_TABLET | Freq: Three times a day (TID) | ORAL | Status: DC
  Administered 2011-11-28 (×2): 5 mg via ORAL
  Filled 2011-11-28 (×2): qty 1

## 2011-11-28 MED ORDER — HYDRALAZINE HCL 20 MG/ML IJ SOLN
10.0000 mg | INTRAMUSCULAR | Status: DC | PRN
  Administered 2011-11-28 (×2): 10 mg via INTRAVENOUS
  Filled 2011-11-28: qty 0.5

## 2011-11-28 MED FILL — Sodium Chloride Irrigation Soln 0.9%: Qty: 3000 | Status: AC

## 2011-11-28 MED FILL — Heparin Sodium (Porcine) Inj 1000 Unit/ML: INTRAMUSCULAR | Qty: 30 | Status: AC

## 2011-11-28 MED FILL — Sodium Chloride IV Soln 0.9%: INTRAVENOUS | Qty: 1000 | Status: AC

## 2011-11-28 NOTE — Progress Notes (Signed)
Patient ID: [name] [name], male   DOB: Aug 04, 1945, 66 y.o.   MRN: [id number] Was out of bed, c/o incisional pain, no weakness, abdominal distention

## 2011-11-28 NOTE — Progress Notes (Signed)
Patient noted to have a BP of 207/82 early am (0535).  BP taken upon arrival (0745) 183/78.   MD notified.  PRN Hydralazine ordered.  [name] [name]

## 2011-11-28 NOTE — Evaluation (Signed)
Physical Therapy Evaluation Patient Details Name: [name] [name] MRN: [id number] DOB: 03/23/1945 Today's Date: 11/28/2011 Time: 1478-2956 PT Time Calculation (min): 20 min  PT Assessment / Plan / Recommendation Clinical Impression  Pt s/p PLF with dural tear requiring flat bed rest. Pt extremely lethargic and with a flat affect this session limiting evaluation. Upon standing, pt extremely weak and shaking of LEs requiring pt to sit down. Will continue further assessment in next session. Pt will benefit from skilled PT in the acute care setting in order to maximize functional mobility and safety prior to d/c    PT Assessment  Patient needs continued PT services    Follow Up Recommendations  Home health PT;Supervision/Assistance - 24 hour    Does the patient have the potential to tolerate intense rehabilitation      Barriers to Discharge        Equipment Recommendations  Rolling walker with 5" wheels;3 in 1 bedside comode    Recommendations for Other Services     Frequency Min 5X/week    Precautions / Restrictions Precautions Precautions: Back Precaution Booklet Issued: Yes (comment) Precaution Comments: pt educated on 3/3 back precautions Required Braces or Orthoses: Spinal Brace Spinal Brace: Lumbar corset;Applied in sitting position Restrictions Weight Bearing Restrictions: No   Pertinent Vitals/Pain Pt extremely lethargic, VSS. Pain 4/10      Mobility  Bed Mobility Bed Mobility: Rolling Left;Left Sidelying to Sit;Sitting - Scoot to [place] of Bed;Sit to Sidelying Left Rolling Left: 4: Min assist Left Sidelying to Sit: 3: Mod assist Sitting - Scoot to Edge of Bed: 4: Min guard Sit to Sidelying Left: 3: Mod assist Details for Bed Mobility Assistance: Cues for safe technique to maintain back precautions during transfer Transfers Transfers: Sit to Stand;Stand to Sit Sit to Stand: 3: Mod assist;With upper extremity assist;From bed Stand to Sit: 3: Mod assist;With upper  extremity assist;To bed Details for Transfer Assistance: VC for safe hand placement and proper technique to maintain back precautions during stand.  Ambulation/Gait Ambulation/Gait Assistance: Not tested (comment)    Shoulder Instructions     Exercises     PT Diagnosis: Difficulty walking;Acute pain  PT Problem List: Decreased activity tolerance;Decreased balance;Decreased mobility;Decreased knowledge of use of DME;Decreased safety awareness;Decreased knowledge of precautions;Pain PT Treatment Interventions: DME instruction;Gait training;Stair training;Functional mobility training;Therapeutic activities;Patient/family education   PT Goals Acute Rehab PT Goals PT Goal Formulation: With patient/family Time For Goal Achievement: 12/05/11 Potential to Achieve Goals: Fair Pt will go Supine/Side to Sit: with modified independence PT Goal: Supine/Side to Sit - Progress: Goal set today Pt will go Sit to Supine/Side: with modified independence PT Goal: Sit to Supine/Side - Progress: Goal set today Pt will go Sit to Stand: with modified independence PT Goal: Sit to Stand - Progress: Goal set today Pt will go Stand to Sit: with modified independence PT Goal: Stand to Sit - Progress: Goal set today Pt will Transfer Bed to Chair/Chair to Bed: with supervision PT Transfer Goal: Bed to Chair/Chair to Bed - Progress: Goal set today Pt will Ambulate: >150 feet;with supervision;with least restrictive assistive device PT Goal: Ambulate - Progress: Goal set today Pt will Go Up / Down Stairs: 3-5 stairs;with supervision;with rail(s) PT Goal: Up/Down Stairs - Progress: Goal set today Additional Goals Additional Goal #1: Pt will be able to verbalize and maintain 3/3 back precautions at all times. PT Goal: Additional Goal #1 - Progress: Goal set today  Visit Information  Last PT Received On: 11/28/11 Assistance Needed: +2  Subjective Data  Patient Stated Goal: to walk   Prior Functioning  Home  Living Lives With: Spouse Available Help at Discharge: Family;Available 24 hours/day Type of Home: House Home Access: Stairs to enter [employer] of Steps: 3 Entrance Stairs-Rails: Right;Left;Can reach both Home Layout: One level Bathroom Shower/Tub: Forensic scientist: Standard Bathroom Accessibility: Yes How Accessible: Accessible via walker Home Adaptive Equipment: Straight cane Prior Function Level of Independence: Independent Able to Take Stairs?: Yes Driving: Yes Vocation: Retired Musician: No difficulties Dominant Hand: Right    Cognition  Overall Cognitive Status: Appears within functional limits for tasks assessed/performed Arousal/Alertness: Lethargic Orientation Level: Appears intact for tasks assessed Behavior During Session: Lethargic    Extremity/Trunk Assessment Right Lower Extremity Assessment RLE ROM/Strength/Tone: Within functional levels RLE Sensation: WFL - Light Touch Left Lower Extremity Assessment LLE ROM/Strength/Tone: Within functional levels LLE Sensation: WFL - Light Touch   Balance Balance Balance Assessed: Yes Static Standing Balance Static Standing - Balance Support: Bilateral upper extremity supported Static Standing - Level of Assistance: 3: Mod assist Static Standing - Comment/# of Minutes: Pt able to stance three times for ~2 minutes prior to shaking of knees and weakness requiring sitting down. mod assist for support through 2 HHA in standing  End of Session PT - End of Session Equipment Utilized During Treatment: Gait belt;Back brace Activity Tolerance: Patient tolerated treatment well Patient left: in bed;with call bell/phone within reach;with family/visitor present Nurse Communication: Mobility status  GP     Milana Kidney 11/28/2011, 5:29 PM  11/28/2011 Milana Kidney DPT PAGER: [phone] OFFICE: [phone]

## 2011-11-29 MED ORDER — OXYCODONE HCL 5 MG PO TABS
10.0000 mg | ORAL_TABLET | ORAL | Status: DC | PRN
  Administered 2011-11-29 (×2): 10 mg via ORAL
  Administered 2011-11-30: 5 mg via ORAL
  Administered 2011-11-30: 10 mg via ORAL
  Filled 2011-11-29 (×2): qty 2
  Filled 2011-11-29: qty 1
  Filled 2011-11-29: qty 2

## 2011-11-29 NOTE — Progress Notes (Signed)
Subjective: Patient reports Status post L5-S1 fusion on 11 6. Patient remains extremely sedated the last dose of any pain medication was last night. Patient received 2 mg IV morphine. As oxycodone written.  Objective: Vital signs in last 24 hours: Temp:  [97.4 F (36.3 C)-98.7 F (37.1 C)] 97.9 F (36.6 C) (11/09 1033) Pulse Rate:  [82-105] 83  (11/09 1033) Resp:  [18-20] 20  (11/09 1033) BP: (133-184)/(73-95) 153/73 mmHg (11/09 1033) SpO2:  [97 %-100 %] 100 % (11/09 1033)  Intake/Output from previous day: 11/08 0701 - 11/09 0700 In: 360 [P.O.:360] Out: 1800 [Urine:1800] Intake/Output this shift: Total I/O In: 360 [P.O.:360] Out: -   Incision is clean and dry. Motor function appears intact when patient is aroused.  Lab Results: No results found for this basename: WBC:2,HGB:2,HCT:2,PLT:2 in the last 72 hours BMET No results found for this basename: NA:2,K:2,CL:2,CO2:2,GLUCOSE:2,BUN:2,CREATININE:2,CALCIUM:2 in the last 72 hours  Studies/Results: No results found.  Assessment/Plan: Discontinue IV morphine. Decreased dose of oxycodone to 10 mg Q4 to 6 hours as needed for pain.  LOS: 4 days  Continue Foley catheter as patient is very difficult to arouse even if this time.   Courtenay Creger J 11/29/2011, 10:59 AM

## 2011-11-29 NOTE — Progress Notes (Signed)
Occupational Therapy Evaluation Patient Details Name: [name] [name] MRN: [id number] DOB: [birth year] Today's Date: 11/29/2011 Time: 4098-1191 OT Time Calculation (min): 16 min  OT Assessment / Plan / Recommendation Clinical Impression  66 yo s/p L4-5 laminectomy, disketomy, fusion. Pt lethargic and confused during this pm eval. Pt states that he was trying to get out the recliner by himseld when he fell on the floor and that it took 3 people to get him of of the floor. Nsg states that pt had slid to the edge of the chair and that she assisted him to the chair and then to the bed with No documented fall. Pt declined to get OOB and participate with OT. If pt does not progress, he will need to go to SNF for rehab prior to return home. Will follow acutely and reassess tomorrow.    OT Assessment  Patient needs continued OT Services    Follow Up Recommendations  SNF;Home health OT (pending progress)    Barriers to Discharge None    Equipment Recommendations  Rolling walker with 5" wheels;3 in 1 bedside comode    Recommendations for Other Services    Frequency  Min 3X/week    Precautions / Restrictions Precautions Precautions: Back Precaution Comments: pt unable to state back precautions Required Braces or Orthoses: Spinal Brace Spinal Brace: Lumbar corset;Applied in sitting position   Pertinent Vitals/Pain Painful. Did not rate. nsg aware.    ADL  Grooming: Moderate assistance Upper Body Bathing: Maximal assistance Lower Body Bathing: +1 Total assistance Upper Body Dressing: Maximal assistance Lower Body Dressing: +1 Total assistance Transfers/Ambulation Related to ADLs: Pt refused to get OOB. Eval completted bed level. will further assess transfers ADL Comments: unable to recall back precautions    OT Diagnosis: Generalized weakness;Acute pain;Altered mental status  OT Problem List: Decreased strength;Decreased activity tolerance;Decreased cognition;Decreased safety  awareness;Decreased knowledge of use of DME or AE;Decreased knowledge of precautions;Pain OT Treatment Interventions: Self-care/ADL training;Energy conservation;DME and/or AE instruction;Therapeutic activities;Patient/family education;Cognitive remediation/compensation   OT Goals Acute Rehab OT Goals OT Goal Formulation: Patient unable to participate in goal setting Time For Goal Achievement: 12/13/11 Potential to Achieve Goals: Good ADL Goals Pt Will Perform Grooming: with supervision;Sitting, edge of bed;with set-up ADL Goal: Grooming - Progress: Goal set today Pt Will Perform Lower Body Bathing: with mod assist;with caregiver independent in assisting;Unsupported;with adaptive equipment ADL Goal: Lower Body Bathing - Progress: Goal set today Pt Will Perform Lower Body Dressing: with min assist;Sit to stand from bed;Unsupported;with adaptive equipment;with set-up ADL Goal: Lower Body Dressing - Progress: Goal set today Pt Will Transfer to Toilet: with min assist;Ambulation;with DME;3-in-1;Maintaining back safety precautions ADL Goal: Toilet Transfer - Progress: Goal set today Pt Will Perform Toileting - Clothing Manipulation: with supervision;Standing;Sitting on 3-in-1 or toilet ADL Goal: Toileting - Clothing Manipulation - Progress: Goal set today Pt Will Perform Toileting - Hygiene: with supervision;Sit to stand from 3-in-1/toilet;Standing at 3-in-1/toilet ADL Goal: Toileting - Hygiene - Progress: Goal set today Additional ADL Goal #1: P will demonstrate 3/3 back precautions during ADL task with min vc. ADL Goal: Additional Goal #1 - Progress: Goal set today  Visit Information  Last OT Received On: 11/29/11 Assistance Needed: +2    Subjective Data      Prior Functioning     Home Living Lives With: Spouse Available Help at Discharge: Family;Available 24 hours/day Type of Home: House Home Access: Stairs to enter [employer] of Steps: 3 Entrance Stairs-Rails:  Right;Left;Can reach both Home Layout: One level Bathroom Shower/Tub:  Tub/shower unit;Curtain Teacher, early years/pre: Yes How Accessible: Accessible via walker Home Adaptive Equipment: Straight cane Prior Function Level of Independence: Independent Able to Take Stairs?: Yes Driving: Yes Vocation: Retired Musician: No difficulties Dominant Hand: Right         Vision/Perception     Cognition  Overall Cognitive Status: Impaired Area of Impairment: Attention;Following commands;Problem solving;Safety/judgement Arousal/Alertness: Lethargic Orientation Level: Appears intact for tasks assessed Behavior During Session: Lethargic Current Attention Level: Sustained Attention - Other Comments: distracted y pian Following Commands: Follows one step commands inconsistently Safety/Judgement: Decreased awareness of safety precautions;Decreased safety judgement for tasks assessed;Other (comment) (got out of chair by himaelf) Problem Solving: delayed Cognition - Other Comments: cognition most likely affected by pain meds    Extremity/Trunk Assessment Right Upper Extremity Assessment RUE ROM/Strength/Tone: [facility] for tasks assessed Left Upper Extremity Assessment LUE ROM/Strength/Tone: WFL for tasks assessed     Mobility Bed Mobility Bed Mobility: Rolling Right;Rolling Left Rolling Right: 3: Mod assist Rolling Left: 3: Mod assist Details for Bed Mobility Assistance: declined to sit EOB Transfers Transfers: Not assessed (declined)     Shoulder Instructions     Exercise     Balance     End of Session OT - End of Session Activity Tolerance: Patient limited by pain;Treatment limited secondary to medication Patient left: in bed;with call bell/phone within reach;with nursing in room Nurse Communication: Mobility status;Other (comment) (confuaion)  GO     Lucynda Rosano,HILLARY 11/29/2011, 4:28 PM [facility], OTR/L  [phone] 11/29/2011

## 2011-11-29 NOTE — Progress Notes (Signed)
Patient and spouse requested to keep foley as patient has very limited mobility.   Nursing explained to patient and family the risks associated with keeping foley in place.   Patient expected to remove foley next day as he becomes more mobile.

## 2011-11-29 NOTE — Progress Notes (Signed)
Physical Therapy Treatment Patient Details Name: [name] [name] MRN: [id number] DOB: 06-17-1945 Today's Date: 11/29/2011 Time: 4098-1191 PT Time Calculation (min): 17 min  PT Assessment / Plan / Recommendation Comments on Treatment Session  Pt with some progress towards goals today with out of bed to chair.  Still with lethargic presentation, did arouse with transfers with his eyes open about 50-60% of session. Minimal verbilizations with sesssion.    Follow Up Recommendations  Home health PT;Supervision/Assistance - 24 hour           Equipment Recommendations  Rolling walker with 5" wheels;3 in 1 bedside comode       Frequency Min 5X/week   Plan Discharge plan remains appropriate;Frequency remains appropriate    Precautions / Restrictions Precautions Precautions: Back Precaution Comments: reeducated pt and spouse on back precautions. Required Braces or Orthoses: Spinal Brace Spinal Brace: Lumbar corset;Applied in sitting position (total assist to don brace at edge of bed)       Mobility  Bed Mobility Rolling Left: 4: Min assist;With rail Left Sidelying to Sit: 4: Min assist;With rails;HOB flat Sitting - Scoot to Edge of Bed: With rail;4: Min guard Transfers Transfers: Pharmacologist Sit to Stand: 3: Mod assist;From bed;With upper extremity assist Stand to Sit: 3: Mod assist;With upper extremity assist;To chair/3-in-1;With armrests Stand Pivot Transfers: 1: +2 Total assist Stand Pivot Transfers: Patient Percentage: 60% Details for Transfer Assistance: cues for hand placement and back precautions with standing up. 2 person assist with stand pivot from bed to chair with walker. max cues for posture, to advance bil LE's toward chair and for walker management. physical assist/facilitation for posture, walker use and LE movements needed as well.                    PT Goals Acute Rehab PT Goals PT Goal: Supine/Side to Sit - Progress: Progressing toward goal PT Goal: Sit to  Stand - Progress: Progressing toward goal PT Goal: Stand to Sit - Progress: Progressing toward goal PT Transfer Goal: Bed to Chair/Chair to Bed - Progress: Progressing toward goal Additional Goals PT Goal: Additional Goal #1 - Progress: Progressing toward goal  Visit Information  Last PT Received On: 11/29/11 Assistance Needed: +2    Subjective Data  Subjective: No new complaints. No pain reported.   Cognition  Overall Cognitive Status: Appears within functional limits for tasks assessed/performed Arousal/Alertness: Lethargic Orientation Level: Appears intact for tasks assessed Behavior During Session: Lethargic       End of Session PT - End of Session Equipment Utilized During Treatment: Gait belt;Back brace Activity Tolerance: Patient tolerated treatment well Patient left: in chair;with call bell/phone within reach;with family/visitor present Nurse Communication: Mobility status   GP     Sallyanne Kuster 11/29/2011, 12:57 PM  Sallyanne Kuster, PTA Office- [phone]

## 2011-11-29 NOTE — Progress Notes (Addendum)
Pt heard calling out, went into room, was attempting to get out of chair, Slide to floor.  Wanting to get into chair. Assisted back to chair.  Keeps stating he was trying to get into the chair., again repeated that he had been in chair.   Assisted back into bed.  No evidence of any injuries.

## 2011-11-30 LAB — BASIC METABOLIC PANEL
BUN: 16 mg/dL (ref 6–23)
CO2: 25 mEq/L (ref 19–32)
Chloride: 101 mEq/L (ref 96–112)
Creatinine, Ser: 0.68 mg/dL (ref 0.50–1.35)
GFR calc Af Amer: >90 mL/min (ref >90)
Potassium: 3.6 mEq/L (ref 3.5–5.1)

## 2011-11-30 MED ORDER — OXYCODONE HCL 5 MG PO TABS
5.0000 mg | ORAL_TABLET | ORAL | Status: DC | PRN
  Administered 2011-11-30 – 2011-12-02 (×6): 5 mg via ORAL
  Filled 2011-11-30 (×7): qty 1

## 2011-11-30 NOTE — Progress Notes (Signed)
Physical Therapy Treatment Patient Details Name: [name] [name] Rigor MRN: [id number] DOB: Mar 19, 1945 Today's Date: 11/30/2011 Time: 9147-8295 PT Time Calculation (min): 16 min  PT Assessment / Plan / Recommendation Comments on Treatment Session  pt making some progress today with increased activity and less assistance. Still need 2 person assist for saftey due to still lethargic and slow to process and slow initiation of activity. Pt with mild-moderal whole body tremors with standing and gait, spouse reports he had these before, just not quite this extreme. Pt was able to keep his eyes open approximately 75% of session today and was able to engage in conversation more.                Follow Up Recommendations  Home health PT;Supervision/Assistance - 24 hour           Equipment Recommendations  Rolling walker with 5" wheels;3 in 1 bedside comode       Frequency Min 5X/week   Plan Discharge plan remains appropriate;Frequency remains appropriate;Other (comment) (Enzor need to update discharge plan if progress is limited)    Precautions / Restrictions Precautions Precautions: Back Precaution Comments: Pt unable to recall back precaution at start of session. Educated pt on all pecautions and wrote them on the board. after approx 5-6 minutes pt still uanble to recall any precautions without max cues (visual and demo cues). Required Braces or Orthoses: Spinal Brace Spinal Brace: Lumbar corset;Applied in supine position (total assist to don brace by OT)       Mobility  Bed Mobility Bed Mobility: Rolling Left;Left Sidelying to Sit;Sitting - Scoot to Edge of Bed Rolling Left: 3: Mod assist Left Sidelying to Sit: 3: Mod assist Sitting - Scoot to Edge of Bed: 5: Supervision Details for Bed Mobility Assistance: pt already seated at EOB with OT upon arrival to room Transfers Sit to Stand: 3: Mod assist;From bed;With upper extremity assist (2 person assist for saftey) Sit to Stand: Patient Percentage:  70% Stand to Sit: 3: Mod assist;With upper extremity assist;To chair/3-in-1;With armrests (2 person assist for safety) Stand to Sit: Patient Percentage: 80% Details for Transfer Assistance: constand vc for sequencing, hand placement and positioning body Ambulation/Gait Ambulation/Gait Assistance: 1: +2 Total assist Ambulation/Gait: Patient Percentage: 70% Ambulation Distance (Feet): 16 Feet Assistive device: Rolling walker Ambulation/Gait Assistance Details: 2 person assist for safety with max cues for posture, increase bil step lenght, to increase knee/hip ext in stance and for walker position and navigation. Gait Pattern: Step-through pattern;Decreased step length - left;Decreased stride length;Decreased step length - right;Shuffle;Left flexed knee in stance;Right flexed knee in stance;Trunk flexed Gait velocity: decreased     PT Goals Acute Rehab PT Goals PT Goal: Sit to Stand - Progress: Progressing toward goal PT Goal: Stand to Sit - Progress: Progressing toward goal PT Goal: Ambulate - Progress: Progressing toward goal Additional Goals PT Goal: Additional Goal #1 - Progress: Progressing toward goal  Visit Information  Last PT Received On: 11/30/11 Assistance Needed: +2 PT/OT Co-Evaluation/Treatment: Yes (for part of session)    Subjective Data  Subjective: Reports having some back pain, 7/10 before and after session.   Cognition  Overall Cognitive Status: Impaired Area of Impairment: Attention;Memory;Safety/judgement;Problem solving Arousal/Alertness: Lethargic Orientation Level: Appears intact for tasks assessed (Pt does not know date/length of time here only) Behavior During Session: Other (comment) (pt alternates between lethargic and restless/fidgity) Current Attention Level: Sustained Attention - Other Comments: internally distracted Memory: Decreased recall of precautions Following Commands: Follows one step commands inconsistently Safety/Judgement: Decreased  awareness of safety precautions;Decreased safety judgement for tasks assessed;Other (comment) Problem Solving: mod A functional basic       End of Session PT - End of Session Equipment Utilized During Treatment: Gait belt;Back brace Activity Tolerance: Patient tolerated treatment well;Patient limited by fatigue Patient left: in chair;with call bell/phone within reach;with family/visitor present (spouse to notify nursing if leaving room for pt saftey) Nurse Communication: Mobility status   GP     Sallyanne Kuster 11/30/2011, 1:53 PM  Sallyanne Kuster, PTA Office- [phone]

## 2011-11-30 NOTE — Progress Notes (Signed)
Patient ID: [name] [name], male   DOB: 18-May-1945, 66 y.o.   MRN: [id number] Subjective: Patient reports feeling some better  Objective: Vital signs in last 24 hours: Temp:  [97.9 F (36.6 C)-98.6 F (37 C)] 97.9 F (36.6 C) (11/10 0621) Pulse Rate:  [83-99] 86  (11/10 0621) Resp:  [18-20] 18  (11/10 0621) BP: (106-153)/(51-84) 106/69 mmHg (11/10 0621) SpO2:  [94 %-100 %] 100 % (11/10 0621)  Intake/Output from previous day: 11/09 0701 - 11/10 0700 In: 720 [P.O.:720] Out: 1650 [Urine:1650] Intake/Output this shift:    He is more alert than yesterday, but still pretty sleepy. He does converse more than he was.  Lab Results: No results found for this basename: WBC:2,HGB:2,HCT:2,PLT:2 in the last 72 hours BMET No results found for this basename: NA:2,K:2,CL:2,CO2:2,GLUCOSE:2,BUN:2,CREATININE:2,CALCIUM:2 in the last 72 hours  Studies/Results: No results found.  Assessment/Plan: Will check some lab, and lower pain meds even more until he is less lethargic.  LOS: 5 days  as above   Reinaldo Meeker, MD 11/30/2011, 10:10 AM

## 2011-11-30 NOTE — Progress Notes (Signed)
Occupational Therapy Treatment Patient Details Name: [name] [name] MRN: [id number] DOB: 12-18-1945 Today's Date: 11/30/2011 Time: 7829-5621 OT Time Calculation (min): 23 min  OT Assessment / Plan / Recommendation Comments on Treatment Session Pt lethargic, although increased alertness today. Requires total A +2 at this time for safe mobility. If pt does not progress with mobility as expected, [place] need short rehab stay. Pt demonstrating preseverations with speech and movements at times during treatment. Internally distracted. Wife present. will continue to follow.    Follow Up Recommendations  SNF;Home health OT    Barriers to Discharge       Equipment Recommendations  Rolling walker with 5" wheels;3 in 1 bedside comode    Recommendations for Other Services    Frequency Min 3X/week   Plan Discharge plan remains appropriate    Precautions / Restrictions Precautions Precautions: Back Precaution Comments: pt unable to state back precautions Required Braces or Orthoses: Spinal Brace Spinal Brace: Lumbar corset;Applied in sitting position   Pertinent Vitals/Pain 8/10. Medicated prior to therapy    ADL  Grooming: Moderate assistance Where Assessed - Grooming: Supported sitting ADL Comments: Focus on increasing alertness and mobility. Pt lethargic. Educated pt on back precautions. Pt able to state 2/3 precautionsafter mod vc.    OT Diagnosis:    OT Problem List:   OT Treatment Interventions:     OT Goals Acute Rehab OT Goals OT Goal Formulation: Patient unable to participate in goal setting Time For Goal Achievement: 12/13/11 Potential to Achieve Goals: Good ADL Goals Pt Will Perform Grooming: with supervision;Sitting, edge of bed;with set-up ADL Goal: Grooming - Progress: Progressing toward goals Pt Will Perform Lower Body Bathing: with mod assist;with caregiver independent in assisting;Unsupported;with adaptive equipment Pt Will Perform Lower Body Dressing: with min assist;Sit  to stand from bed;Unsupported;with adaptive equipment;with set-up Pt Will Transfer to Toilet: with min assist;Ambulation;with DME;3-in-1;Maintaining back safety precautions ADL Goal: Toilet Transfer - Progress: Progressing toward goals Pt Will Perform Toileting - Clothing Manipulation: with supervision;Standing;Sitting on 3-in-1 or toilet ADL Goal: Toileting - Clothing Manipulation - Progress: Progressing toward goals Pt Will Perform Toileting - Hygiene: with supervision;Sit to stand from 3-in-1/toilet;Standing at 3-in-1/toilet ADL Goal: Toileting - Hygiene - Progress: Progressing toward goals Additional ADL Goal #1: P will demonstrate 3/3 back precautions during ADL task with min vc. ADL Goal: Additional Goal #1 - Progress: Progressing toward goals  Visit Information  Last OT Received On: 11/30/11 Assistance Needed: +2    Subjective Data      Prior Functioning       Cognition  Overall Cognitive Status: Impaired Area of Impairment: Attention;Following commands;Problem solving;Safety/judgement Arousal/Alertness: Lethargic Orientation Level: Appears intact for tasks assessed Behavior During Session: Lethargic Current Attention Level: Sustained Attention - Other Comments: internally distracted Following Commands: Follows one step commands inconsistently Safety/Judgement: Decreased awareness of safety precautions;Decreased safety judgement for tasks assessed;Other (comment) Problem Solving: mod A functional basic    Mobility  Shoulder Instructions Bed Mobility Bed Mobility: Rolling Left;Left Sidelying to Sit;Sitting - Scoot to Edge of Bed Rolling Left: 3: Mod assist Left Sidelying to Sit: 3: Mod assist Sitting - Scoot to Edge of Bed: 5: Supervision Transfers Transfers: Sit to Stand;Stand to Sit Sit to Stand: 1: +2 Total assist;From bed;With upper extremity assist Sit to Stand: Patient Percentage: 70% Stand to Sit: 1: +2 Total assist;With upper extremity assist;To  chair/3-in-1 Stand to Sit: Patient Percentage: 80% Details for Transfer Assistance: constand vc for sequencing, hand placement and positioning body  Exercises      Balance  Mod A   End of Session OT - End of Session Equipment Utilized During Treatment: Gait belt Activity Tolerance: Patient limited by fatigue;Patient limited by pain;Other (comment) (cognitive status) Patient left: in chair;with call bell/phone within reach;with family/visitor present Nurse Communication: Other (comment) (IV site)  GO     Arantxa Piercey,HILLARY 11/30/2011, 12:05 PM [facility], OTR/L  [phone] 11/30/2011

## 2011-11-30 NOTE — Progress Notes (Signed)
Pt assisted back to bed, needs frequent cues, tolerated fairly well, pt seems to have problems with comprehension, and putting actions together.

## 2011-11-30 NOTE — Progress Notes (Signed)
Patient remains lethargic but slowly improving.  Falls asleep frequently, limited conversation.

## 2011-12-01 DIAGNOSIS — M48061 Spinal stenosis, lumbar region without neurogenic claudication: Secondary | ICD-10-CM

## 2011-12-01 LAB — CBC WITH DIFFERENTIAL/PLATELET
Basophils Absolute: 0 10*3/uL (ref 0.0–0.1)
Basophils Relative: 0 % (ref 0–1)
Eosinophils Absolute: 0.1 10*3/uL (ref 0.0–0.7)
MCH: 33.6 pg (ref 26.0–34.0)
MCHC: 34.3 g/dL (ref 30.0–36.0)
Neutro Abs: 4.6 10*3/uL (ref 1.7–7.7)
Neutrophils Relative %: 73 % (ref 43–77)
Platelets: 181 10*3/uL (ref 150–400)

## 2011-12-01 LAB — COMPREHENSIVE METABOLIC PANEL
ALT: 25 U/L (ref 0–53)
AST: 27 U/L (ref 0–37)
CO2: 29 mEq/L (ref 19–32)
Calcium: 10.3 mg/dL (ref 8.4–10.5)
Chloride: 98 mEq/L (ref 96–112)
Creatinine, Ser: 0.7 mg/dL (ref 0.50–1.35)
GFR calc Af Amer: >90 mL/min (ref >90)
GFR calc non Af Amer: >90 mL/min (ref >90)
Glucose, Bld: 160 mg/dL — ABNORMAL HIGH (ref 70–99)
Sodium: 137 mEq/L (ref 135–145)
Total Bilirubin: 0.5 mg/dL (ref 0.3–1.2)

## 2011-12-01 MED ORDER — TAMSULOSIN HCL 0.4 MG PO CAPS
0.8000 mg | ORAL_CAPSULE | Freq: Every day | ORAL | Status: DC
  Administered 2011-12-01 – 2011-12-03 (×3): 0.8 mg via ORAL
  Filled 2011-12-01 (×4): qty 2

## 2011-12-01 MED ORDER — FLEET ENEMA 7-19 GM/118ML RE ENEM: 1.0000 | ENEMA | Freq: Every day | RECTAL | Status: DC | PRN

## 2011-12-01 MED ORDER — BISACODYL 5 MG PO TBEC
5.0000 mg | DELAYED_RELEASE_TABLET | Freq: Every day | ORAL | Status: DC | PRN
  Administered 2011-12-01: 5 mg via ORAL
  Filled 2011-12-01: qty 1

## 2011-12-01 NOTE — Consult Note (Signed)
Physical Medicine and Rehabilitation Consult Reason for Consult: Lumbar stenosis with radiculopathy.  Referring Physician: Dr. Jeral Fruit.    HPI: [name] [name] is a 66 y.o. male with history of CAD, CVA, progressive LBP with radiculopathy BLE.  Work up with severe stenosis L4-5 with facet arthropathy and patient elected to undergo L3 and L4 laminectomy with decompression and fusion L4-5 by Dr. Jeral Fruit on 11/26/11. Post op with sedation and lethargy question narcotic induced.  Continues to have confusion. Therapy ongoing and patient needs cues and redirection for sequencing due to perseveration. MDrecommending CIR for progression.    Patient grimaces from time to time but otherwise has no complaints  Review of Systems  Unable to perform ROS: mental acuity  Respiratory: Negative for shortness of breath.   Cardiovascular: Negative for chest pain.  Musculoskeletal: Positive for back pain.  Psychiatric/Behavioral: The patient has insomnia.    Past Medical History  Diagnosis Date  . Arthritis   . Depression     STARTED AFTER  OPEN HEART SURG.  . Stroke     2008  RIGHT SIDE...HAD FOR A COUPLE OF DAYS AND THEN WENT AWAY  . Stroke     WENT TO [place]  2008  . Neuropathy     [facility]        SEES  DR. Corrinne Eagle  . Coronary artery disease   . Hypertension    Past Surgical History  Procedure Date  . Coronary artery bypass graft     (2010 @ [facility]  . Hand surgery     CRUSHING INJURY---1966  HAS ABOUT 6 SURGERIES THEN  . Hernia repair     BILATERAL  . Eye surgery     ?? BLEPHOPLASTY  . Lumbar fusion 11/25/2011    POSTERIOR   History reviewed. No pertinent family history.  Social History: Married. Retired Nature conservation officer. Wife reports that he quit smoking about 3 years ago. He started smoking about 43 years ago. He quit smokeless tobacco use about 5 years ago. He reports that he drinks about  3 shots of liquor about 5 times a week--more recently.  He reports that he does not use illicit drugs. Wife  retired and supportive.   Allergies: No Known Allergies  Medications Prior to Admission  Medication Sig Dispense Refill  . Cholecalciferol (VITAMIN D-3) 1000 UNITS CAPS Take 1 capsule by mouth daily.      . Choline Fenofibrate (TRILIPIX) 135 MG capsule Take 135 mg by mouth at bedtime.      . DULoxetine (CYMBALTA) 30 MG capsule Take 30-60 mg by mouth 2 (two) times daily. 2 after breakfast  1 after lunch      . HYDROcodone-acetaminophen (VICODIN) 5-500 MG per tablet Take 1 tablet by mouth daily.      [name] lamoTRIgine (LAMICTAL) 25 MG tablet Take 50 mg by mouth 2 (two) times daily. On 10/9- 1 tablet twice daily On 10/23-2 tablets twice daily On 11/6-3 tablets three times daily      . metoprolol succinate (TOPROL-XL) 25 MG 24 hr tablet Take 25 mg by mouth 2 (two) times daily.       [name] OVER THE COUNTER MEDICATION Place 1-2 drops into both eyes daily. Multi Action Relief Lubricant Eye Drop OTC      . predniSONE (DELTASONE) 10 MG tablet Take 10-60 mg by mouth See admin instructions. Starts 10/31  Tapered dose.      . simvastatin (ZOCOR) 40 MG tablet Take 40 mg by mouth at bedtime.      [name]  triamcinolone cream (KENALOG) 0.1 % Apply 1 application topically 3 (three) times daily as needed. To affected area chest.        Home: Home Living Lives With: Spouse Available Help at Discharge: Family;Available 24 hours/day Type of Home: House Home Access: Stairs to enter [employer] of Steps: 3 Entrance Stairs-Rails: Right;Left;Can reach both Home Layout: One level Bathroom Shower/Tub: Forensic scientist: Standard Bathroom Accessibility: Yes How Accessible: Accessible via walker Home Adaptive Equipment: Straight cane  Functional History: Prior Function Able to Take Stairs?: Yes Driving: Yes Vocation: Retired Functional Status:  Mobility: Bed Mobility Bed Mobility: Rolling Left;Left Sidelying to Sit;Sitting - Scoot to Edge of Bed Rolling Right: 3: Mod  assist Rolling Left: 3: Mod assist Left Sidelying to Sit: 3: Mod assist Sitting - Scoot to Edge of Bed: 5: Supervision Sit to Sidelying Left: 3: Mod assist Transfers Transfers: Stand Pivot Transfers Sit to Stand: 3: Mod assist;From bed;With upper extremity assist (2 person assist for saftey) Sit to Stand: Patient Percentage: 70% Stand to Sit: 3: Mod assist;With upper extremity assist;To chair/3-in-1;With armrests (2 person assist for safety) Stand to Sit: Patient Percentage: 80% Stand Pivot Transfers: 1: +2 Total assist Stand Pivot Transfers: Patient Percentage: 60% Ambulation/Gait Ambulation/Gait Assistance: 1: +2 Total assist Ambulation/Gait: Patient Percentage: 70% Ambulation Distance (Feet): 16 Feet Assistive device: Rolling walker Ambulation/Gait Assistance Details: 2 person assist for safety with max cues for posture, increase bil step lenght, to increase knee/hip ext in stance and for walker position and navigation. Gait Pattern: Step-through pattern;Decreased step length - left;Decreased stride length;Decreased step length - right;Shuffle;Left flexed knee in stance;Right flexed knee in stance;Trunk flexed Gait velocity: decreased    ADL: ADL Grooming: Moderate assistance Where Assessed - Grooming: Supported sitting Upper Body Bathing: Maximal assistance Lower Body Bathing: +1 Total assistance Upper Body Dressing: Maximal assistance Lower Body Dressing: +1 Total assistance Transfers/Ambulation Related to ADLs: Pt refused to get OOB. Eval completted bed level. will further assess transfers ADL Comments: Focus on increasing alertness and mobility. Pt lethargic. Educated pt on back precautions. Pt able to state 2/3 precautionsafter mod vc.  Cognition: Cognition Arousal/Alertness: Lethargic Orientation Level: Oriented X4 Cognition Overall Cognitive Status: Impaired Area of Impairment: Attention;Memory;Safety/judgement;Problem solving Arousal/Alertness:  Lethargic Orientation Level: Appears intact for tasks assessed (Pt does not know date/length of time here only) Behavior During Session: Other (comment) (pt alternates between lethargic and restless/fidgity) Current Attention Level: Sustained Attention - Other Comments: internally distracted Memory: Decreased recall of precautions Following Commands: Follows one step commands inconsistently Safety/Judgement: Decreased awareness of safety precautions;Decreased safety judgement for tasks assessed;Other (comment) Problem Solving: mod A functional basic Cognition - Other Comments: cognition most likely affected by pain meds  Blood pressure 156/64, pulse 81, temperature 97.7 F (36.5 C), temperature source Oral, resp. rate 18, height 5\' 11"  (1.803 m), weight 97.2 kg (214 lb 4.6 oz), SpO2 100.00%. Physical Exam  Nursing note and vitals reviewed. Constitutional: He appears well-developed and well-nourished.  HENT:  Head: Normocephalic.  Neck: Normal range of motion.  Cardiovascular: Normal rate and regular rhythm.   Pulmonary/Chest: Effort normal and breath sounds normal.  Abdominal: Soft. Bowel sounds are normal.  Musculoskeletal:       Back pain with attempted SLR LLE>RLE  Neurological: He is alert.       Confused and restless. Distracted. Constant movement and occasionally picking at air/clothes, etc. Able to follow basic commands with redirection.   Skin: Skin is warm and dry.  4/5 right grip, 5/5 right deltoid bicep  tricep, 5/5 in the left deltoid, biceps, triceps, grip 3 minus bilateral hip flexors knee extensors ankle dorsiflexors and plantar flexors The patient has difficulty with two-step commands  Results for orders placed during the hospital encounter of 11/25/11 (from the past 24 hour(s))  BASIC METABOLIC PANEL     Status: Abnormal   Collection Time   11/30/11 10:38 AM      Component Value Range   Sodium 137  135 - 145 mEq/L   Potassium 3.6  3.5 - 5.1 mEq/L   Chloride 101   96 - 112 mEq/L   CO2 25  19 - 32 mEq/L   Glucose, Bld 141 (*) 70 - 99 mg/dL   BUN 16  6 - 23 mg/dL   Creatinine, Ser 1.61  0.50 - 1.35 mg/dL   Calcium 9.4  8.4 - 09.6 mg/dL   GFR calc non Af Amer >90  >90 mL/min   GFR calc Af Amer >90  >90 mL/min   No results found.  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 Does the need for close, 24 hr/day medical supervision in concert with the patient's rehab needs make it unreasonable for this patient to be served in a less intensive setting? Potentially 2. 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 Due to bladder management, bowel management, safety, skin/wound care, disease management, medication administration, pain management and patient education, does the patient require 24 hr/day rehab nursing? Potentially 4. Does the patient require coordinated care of a physician, rehab nurse, PT (1-2 hrs/day, 5 days/week), OT (1-2 hrs/day, 5 days/week) and SLP (0.5-1 hrs/day, 5 days/week) to address physical and functional deficits in the context of the above medical diagnosis(es)? Potentially Addressing deficits in the following areas: balance, endurance, locomotion, strength, transferring, bowel/bladder control, bathing, dressing, feeding, grooming, toileting and cognition 5. Can the patient actively participate in an intensive therapy program of at least 3 hrs of therapy per day at least 5 days per week? Potentially 6. The potential for patient to make measurable gains while on inpatient rehab is good 7. Anticipated functional outcomes upon discharge from inpatient rehab are Supervision mobility with PT, Supervision ADLs with OT, Oriented x3 follows complex commands with SLP. 8. Estimated rehab length of stay to reach the above functional goals is: 10-12 days 9. Does the  patient have adequate social supports to accommodate these discharge functional goals? Potentially 10. Anticipated D/C setting: Home 11. Anticipated post D/C treatments: HH therapy 12. Overall Rehab/Functional Prognosis: good  RECOMMENDATIONS: This patient's condition is appropriate for continued rehabilitative care in the following setting: CIR Patient has agreed to participate in recommended program. Potentially Note that insurance prior authorization [place] be required for reimbursement for recommended care.  Comment:Patient still very distracted confused Kamer have problems with rehabilitation process    12/01/2011

## 2011-12-01 NOTE — Progress Notes (Signed)
Physical Therapy Treatment Note   12/01/11 0955  PT Visit Information  Last PT Received On 12/01/11  Assistance Needed +2  PT Time Calculation  PT Start Time 0955  PT Stop Time 1013  PT Time Calculation (min) 18 min  Subjective Data  Subjective Pt received sitting up in chair with wife present.  Precautions  Precautions Fall;Back  Precaution Comments Pt unable to recall back precaution at start of session. Educated pt on all pecautions and wrote them on the board. after approx 5-6 minutes pt still uanble to recall any precautions without max cues (visual and demo cues).  Required Braces or Orthoses Spinal Brace  Restrictions  Weight Bearing Restrictions No  Cognition  Overall Cognitive Status Impaired  Area of Impairment Memory;Safety/judgement;Following commands;Problem solving  Arousal/Alertness (pt in a "daze")  Orientation Level Disoriented to;Place;Time;Situation  Behavior During Session Lethargic  Memory Decreased recall of precautions  Safety/Judgement Decreased awareness of safety precautions;Decreased safety judgement for tasks assessed  Problem Solving delayed  Bed Mobility  Bed Mobility Not assessed  Transfers  Transfers Sit to Stand;Stand to Sit  Sit to Stand 1: +2 Total assist;With upper extremity assist;From chair/3-in-1;With armrests  Sit to Stand: Patient Percentage 50%  Stand to Sit 1: +2 Total assist;To chair/3-in-1  Stand to Sit: Patient Percentage 50%  Details for Transfer Assistance attempted x 5 prior to successful stand, pt with onset of 10/10 back pain with transfer but diminished once up  Ambulation/Gait  Ambulation/Gait Assistance 1: +2 Total assist  Ambulation/Gait: Patient Percentage 60%  Ambulation Distance (Feet) 5 Feet  Assistive device 2 person hand held assist  Ambulation/Gait Assistance Details pt with shaky/tremor bilat LEs, difficulty with co-ordinating and advancing LEs  Gait Pattern Step-through pattern;Decreased step length -  right;Decreased step length - left  Gait velocity decreased  PT - End of Session  Equipment Utilized During Treatment Gait belt;Back brace  Activity Tolerance Patient limited by pain  Patient left in chair;with call bell/phone within reach;with family/visitor present  Nurse Communication Mobility status  PT - Assessment/Plan  Comments on Treatment Session Pt s/p posterior lumbar fusion presenting with increased pain and delirium limiting rehab progression. Patient cont to require maxA for all mobility. Pt unsafe to return home at this time and would benefit from rehab post d/c to achieve safe I function prior to d/c home.  PT Plan Discharge plan remains appropriate;Frequency remains appropriate;Other (comment)  PT Frequency Min 5X/week  Recommendations for Other Services Rehab consult  Follow Up Recommendations SNF;CIR;Supervision/Assistance - 24 hour  Equipment Recommended Rolling walker with 5" wheels;3 in 1 bedside comode  Acute Rehab PT Goals  PT Goal: Sit to Stand - Progress Progressing toward goal  PT Goal: Stand to Sit - Progress Progressing toward goal  PT Goal: Ambulate - Progress Progressing toward goal  PT General Charges  $$ ACUTE PT VISIT 1 Procedure  PT Treatments  $Therapeutic Activity 8-22 mins    Pain: 10/10 back pain. RN brought in pain meds  Lewis Shock, PT, DPT Pager #: [phone] Office #: [phone]

## 2011-12-01 NOTE — Progress Notes (Signed)
Rash noted to back, raised red areas, denies itching.  Also, reddened area in shape of square around incision probably from tape/dressing.

## 2011-12-01 NOTE — Clinical Social Work Psychosocial (Signed)
     Clinical Social Work Department BRIEF PSYCHOSOCIAL ASSESSMENT 12/01/2011  Patient:  [name] [name], [name] [name]     Account Number:  [id number]     Admit date:  11/25/2011  Clinical Social Worker:  Peggyann Shoals  Date/Time:  12/01/2011 04:23 PM  Referred by:  Physician  Date Referred:  12/01/2011 Referred for  SNF Placement   Other Referral:   Interview type:  Family Other interview type:    PSYCHOSOCIAL DATA Living Status:  WIFE Admitted from facility:   Level of care:   Primary support name:  Verlee Monte Tonkovich Primary support relationship to patient:  SPOUSE Degree of support available:   very supportive.    CURRENT CONCERNS Current Concerns  Post-Acute Placement   Other Concerns:    SOCIAL WORK ASSESSMENT / PLAN CSW signed on, as discharge recommendations have changed. PT is recommending CIR vs SNF.    CSW met with pt's wife to address consult. Pt was sleeping soundly during interview. CSW introduced herself and explained role of social work. CSW explained the process of discharging to SNF with Medicare.    Pt's wife shared that they are hopeful for CIR. Pt's wife shrared that she is unable to provide needed care for pt at home and would like placement. She feels CIR would be the best option, however is open to SNF placement. Pt's wife prefers [place] area.    CSW will initiate SNF search and follow up with bed offers. CSW will continue to follow.   Assessment/plan status:  Psychosocial Support/Ongoing Assessment of Needs Other assessment/ plan:   Information/referral to community resources:   SNF list    PATIENTS/FAMILYS RESPONSE TO PLAN OF CARE: Pt was sleeping soundly. Pt's wife was very pleasant and agreeable to CIR at discharge. However, if SNF was only option, should like [place] [facility].

## 2011-12-01 NOTE — Progress Notes (Signed)
Rehab Admissions Coordinator Note:  Patient was screened by Trish Mage for appropriateness for an Inpatient Acute Rehab Consult.  At this time, I am deferring the prescreen.  An inpatient rehab consult has already been ordered and is pending.  Trish Mage 12/01/2011, 1:44 PM  I can be reached at [phone].

## 2011-12-01 NOTE — Progress Notes (Signed)
Pt given prune juice earlier, now up on bedside commode,  Difficultly in getting legs to hold him up.

## 2011-12-01 NOTE — Progress Notes (Signed)
Confused, no fever. wound with some echymosis. No weakness

## 2011-12-02 DIAGNOSIS — M48061 Spinal stenosis, lumbar region without neurogenic claudication: Secondary | ICD-10-CM

## 2011-12-02 MED ORDER — HYDROCODONE-ACETAMINOPHEN 10-325 MG PO TABS
1.0000 | ORAL_TABLET | ORAL | Status: DC | PRN
  Administered 2011-12-03 (×2): 1 via ORAL
  Filled 2011-12-02 (×2): qty 1

## 2011-12-02 NOTE — Progress Notes (Addendum)
Patient ID: [name] [name], male   DOB: Jul 31, 1945, 66 y.o.   MRN: [id number] Seen by rehabilitation. More awake, wound dry.

## 2011-12-02 NOTE — Progress Notes (Signed)
Rehab admissions - Evaluated for possible admission.  I will talk with patient and his wife about inpatient rehab in the morning.  Once medically ready, likely I can admit to inpatient rehab.  Call me for questions.  #[phone]

## 2011-12-02 NOTE — Progress Notes (Signed)
Physical Therapy Treatment Patient Details Name: [name] [name] MRN: [id number] DOB: [birth year] Today's Date: 12/02/2011 Time: 4098-1191 PT Time Calculation (min): 22 min  PT Assessment / Plan / Recommendation Comments on Treatment Session  Pt s/p PLF demonstrating mobility progression this date. Pt remains to have decreased safety awareness and cognitive deficits as well as con't to require assist for all mobility. pt to con't to benefit from CIR to achieve maximal funcitonal recovery for safe transition home.    Follow Up Recommendations  SNF;CIR;Supervision/Assistance - 24 hour     Does the patient have the potential to tolerate intense rehabilitation     Barriers to Discharge        Equipment Recommendations  Rolling walker with 5" wheels;3 in 1 bedside comode    Recommendations for Other Services    Frequency Min 5X/week   Plan Frequency remains appropriate;Other (comment);Discharge plan needs to be updated    Precautions / Restrictions Precautions Precautions: Fall Precaution Comments: Pt stated that it bothered his back when he bent it---reiterated that he is not suppose to bend Required Braces or Orthoses: Spinal Brace Spinal Brace: Applied in sitting position (total A to don) Restrictions Weight Bearing Restrictions: No   Pertinent Vitals/Pain 6/10 back pain at surgical site    Mobility  Bed Mobility Bed Mobility: Rolling Left;Left Sidelying to Sit Rolling Left: 4: Min assist (v/c's to stay on task) Left Sidelying to Sit: 2: Max assist Details for Bed Mobility Assistance: tactile and v/c's to stay on task Transfers Transfers: Sit to Stand;Stand to Sit Sit to Stand: With upper extremity assist;From bed;3: Mod assist Stand to Sit: 3: Mod assist;With upper extremity assist;To chair/3-in-1 Details for Transfer Assistance: pt con't to lean backwards and has "shakes" upon standing Ambulation/Gait Ambulation/Gait Assistance: 1: +2 Total assist Ambulation/Gait: Patient  Percentage: 80% Ambulation Distance (Feet): 20 Feet Assistive device: Rolling walker Ambulation/Gait Assistance Details: pt con't to required +2 for safety due to tremors, cognitive deficits and retroplulsion. Gait Pattern: Step-through pattern;Decreased step length - right;Decreased step length - left    Exercises     PT Diagnosis:    PT Problem List:   PT Treatment Interventions:     PT Goals Acute Rehab PT Goals PT Goal: Supine/Side to Sit - Progress: Progressing toward goal PT Goal: Sit to Stand - Progress: Progressing toward goal PT Goal: Stand to Sit - Progress: Progressing toward goal PT Goal: Ambulate - Progress: Progressing toward goal  Visit Information  Last PT Received On: 12/02/11 Assistance Needed: +2 PT/OT Co-Evaluation/Treatment: Yes    Subjective Data  Subjective: Pt received supine in bed agreeable to PT.    Cognition  Overall Cognitive Status: Impaired Orientation Level: Disoriented X4;Place;Time;Situation Memory: Decreased recall of precautions Safety/Judgement: Decreased awareness of safety precautions;Decreased safety judgement for tasks assessed Problem Solving: delayed    Balance  Dynamic Standing Balance Dynamic Standing - Balance Support: During functional activity;Left upper extremity supported Dynamic Standing - Level of Assistance: 4: Min assist (varied to modA with fatigue) Dynamic Standing - Balance Activities:  (grooming activities with OT) Dynamic Standing - Comments: pt con't to require tactile and verbal cues to minimize posterior lean and adhere to back precautions, pt with minimal problem solving to correct LOB  End of Session PT - End of Session Equipment Utilized During Treatment: Gait belt;Back brace Activity Tolerance: Patient tolerated treatment well Patient left: in chair;with call bell/phone within reach;with family/visitor present Nurse Communication: Mobility status   GP     Marcene Brawn 12/02/2011,  10:03  AM  Lewis Shock, PT, DPT Pager #: [phone] Office #: [phone]

## 2011-12-02 NOTE — Progress Notes (Signed)
Pt more alert and mobile this evening. Patient requesting for foley catheter to be removed. Foley removed at 0545. Fluids encouraged. Will cont to monitor.

## 2011-12-02 NOTE — Progress Notes (Signed)
Occupational Therapy Treatment Patient Details Name: [name] [name] MRN: [id number] DOB: Mar 07, 1945 Today's Date: 12/02/2011 Time: 4098-1191 OT Time Calculation (min): 25 min  OT Assessment / Plan / Recommendation Comments on Treatment Session This 66 yo male, making progress but slowly. Tendency with posterior lean in sitting and standing, not remembering or consisently follow back precautions. Other cognitive issues as well.    Follow Up Recommendations  CIR       Equipment Recommendations  Rolling walker with 5" wheels;3 in 1 bedside comode       Frequency Min 3X/week   Plan Discharge plan needs to be updated    Precautions / Restrictions Precautions Precautions: Fall Precaution Comments: Pt stated that it bothered his back when he bent it---reiterated that he is not suppose to bend Required Braces or Orthoses: Spinal Brace Spinal Brace: Applied in sitting position (total A to don) Restrictions Weight Bearing Restrictions: No   Pertinent Vitals/Pain 6/10 back    ADL  Grooming: Performed;Wash/dry face;Teeth care;Minimal assistance (with Mod cues and varied A to maintain standing-post. lean) Where Assessed - Grooming: Supported standing Toilet Transfer: Simulated;Moderate assistance Toilet Transfer Method: Sit to stand Toilet Transfer Equipment:  (Bed, into bathroom to groom, out into hallway, to recliner) Equipment Used: Back brace;Rolling walker;Gait belt Transfers/Ambulation Related to ADLs: Mod A sit to stand and stand to sit; total A+2 (pt=80%) for ambulation due to safety      OT Goals ADL Goals Pt Will Perform Grooming: with supervision;Sitting, edge of bed;with set-up ADL Goal: Grooming - Progress: Progressing toward goals Pt Will Perform Lower Body Bathing: with mod assist;with caregiver independent in assisting;Unsupported;with adaptive equipment Pt Will Perform Lower Body Dressing: with min assist;Sit to stand from bed;Unsupported;with adaptive equipment Pt  Will Transfer to Toilet: with min assist;Ambulation;with DME;Maintaining back safety precautions ADL Goal: Toilet Transfer - Progress: Progressing toward goals Pt Will Perform Toileting - Clothing Manipulation: with supervision;Standing;Sitting on 3-in-1 or toilet Pt Will Perform Toileting - Hygiene: with supervision;Sit to stand from 3-in-1/toilet;Standing at 3-in-1/toilet Additional ADL Goal #1: Pt will demonstrate 3/3 back precautions durin ADL task with min vc. ADL Goal: Additional Goal #1 - Progress: Not progressing  Visit Information  Last OT Received On: 12/02/11 Assistance Needed: +2    Subjective Data  Subjective: "I was thinking about that girl in a different way" (his response when I asked him what month it was and gave him a clue that it was "[place] month")      Cognition  Overall Cognitive Status: Impaired Orientation Level: Disoriented X4;Place;Time;Situation Memory: Decreased recall of precautions Safety/Judgement: Decreased awareness of safety precautions;Decreased safety judgement for tasks assessed Problem Solving: delayed    Mobility  Bed Mobility Bed Mobility: Rolling Left;Left Sidelying to Sit Rolling Left: 4: Min assist (v/c's to stay on task) Left Sidelying to Sit: 2: Max assist Details for Bed Mobility Assistance: tactile and v/c's to stay on task Transfers Sit to Stand: With upper extremity assist;From bed;3: Mod assist Stand to Sit: 3: Mod assist;With upper extremity assist;To chair/3-in-1 Details for Transfer Assistance: pt con't to lean backwards and has "shakes" upon standing          Balance Dynamic Standing Balance Dynamic Standing - Balance Support: During functional activity;Left upper extremity supported Dynamic Standing - Level of Assistance: 4: Min assist (varied to modA with fatigue) Dynamic Standing - Balance Activities:  (grooming activities with OT) Dynamic Standing - Comments: pt con't to require tactile and verbal cues to minimize  posterior lean and adhere to  back precautions, pt with minimal problem solving to correct LOB   End of Session OT - End of Session Equipment Utilized During Treatment: Gait belt;Back brace (RW) Activity Tolerance: Patient tolerated treatment well Patient left: in chair;with family/visitor present       Evette Georges [phone] 12/02/2011, 10:13 AM

## 2011-12-03 ENCOUNTER — Inpatient Hospital Stay (HOSPITAL_COMMUNITY)
Admission: RE | Admit: 2011-12-03 | Discharge: 2011-12-05 | DRG: 945 | Disposition: A | Discharge status: Home / Self Care | Payer: Medicare Other | Source: Ambulatory Visit | Attending: Physical Medicine & Rehabilitation | Admitting: Physical Medicine & Rehabilitation

## 2011-12-03 ENCOUNTER — Encounter (HOSPITAL_COMMUNITY): Payer: Self-pay | Admitting: *Deleted

## 2011-12-03 DIAGNOSIS — M129 Arthropathy, unspecified: Secondary | ICD-10-CM

## 2011-12-03 DIAGNOSIS — Z79899 Other long term (current) drug therapy: Secondary | ICD-10-CM

## 2011-12-03 DIAGNOSIS — I10 Essential (primary) hypertension: Secondary | ICD-10-CM

## 2011-12-03 DIAGNOSIS — R29898 Other symptoms and signs involving the musculoskeletal system: Secondary | ICD-10-CM

## 2011-12-03 DIAGNOSIS — G589 Mononeuropathy, unspecified: Secondary | ICD-10-CM

## 2011-12-03 DIAGNOSIS — I2510 Atherosclerotic heart disease of native coronary artery without angina pectoris: Secondary | ICD-10-CM

## 2011-12-03 DIAGNOSIS — F3289 Other specified depressive episodes: Secondary | ICD-10-CM

## 2011-12-03 DIAGNOSIS — Z8673 Personal history of transient ischemic attack (TIA), and cerebral infarction without residual deficits: Secondary | ICD-10-CM

## 2011-12-03 DIAGNOSIS — Z981 Arthrodesis status: Secondary | ICD-10-CM

## 2011-12-03 DIAGNOSIS — M48061 Spinal stenosis, lumbar region without neurogenic claudication: Secondary | ICD-10-CM

## 2011-12-03 DIAGNOSIS — F329 Major depressive disorder, single episode, unspecified: Secondary | ICD-10-CM

## 2011-12-03 DIAGNOSIS — Z87891 Personal history of nicotine dependence: Secondary | ICD-10-CM

## 2011-12-03 DIAGNOSIS — Z5189 Encounter for other specified aftercare: Principal | ICD-10-CM

## 2011-12-03 DIAGNOSIS — R339 Retention of urine, unspecified: Secondary | ICD-10-CM

## 2011-12-03 DIAGNOSIS — G9340 Encephalopathy, unspecified: Secondary | ICD-10-CM

## 2011-12-03 DIAGNOSIS — Z951 Presence of aortocoronary bypass graft: Secondary | ICD-10-CM

## 2011-12-03 MED ORDER — TRAMADOL HCL 50 MG PO TABS
50.0000 mg | ORAL_TABLET | Freq: Four times a day (QID) | ORAL | Status: DC | PRN
  Administered 2011-12-04 – 2011-12-05 (×3): 50 mg via ORAL
  Filled 2011-12-03 (×3): qty 1

## 2011-12-03 MED ORDER — SIMVASTATIN 40 MG PO TABS
40.0000 mg | ORAL_TABLET | Freq: Every day | ORAL | Status: DC
  Administered 2011-12-03 – 2011-12-04 (×2): 40 mg via ORAL
  Filled 2011-12-03 (×3): qty 1

## 2011-12-03 MED ORDER — METOPROLOL SUCCINATE ER 25 MG PO TB24
25.0000 mg | ORAL_TABLET | Freq: Two times a day (BID) | ORAL | Status: DC
  Administered 2011-12-03 – 2011-12-05 (×4): 25 mg via ORAL
  Filled 2011-12-03 (×6): qty 1

## 2011-12-03 MED ORDER — FLEET ENEMA 7-19 GM/118ML RE ENEM: 1.0000 | ENEMA | Freq: Every day | RECTAL | Status: DC | PRN

## 2011-12-03 MED ORDER — FENOFIBRATE 160 MG PO TABS
160.0000 mg | ORAL_TABLET | Freq: Every day | ORAL | Status: DC
  Administered 2011-12-04 – 2011-12-05 (×2): 160 mg via ORAL
  Filled 2011-12-03 (×3): qty 1

## 2011-12-03 MED ORDER — ALUM & MAG HYDROXIDE-SIMETH 200-200-20 MG/5ML PO SUSP: 30.0000 mL | ORAL | Status: DC | PRN

## 2011-12-03 MED ORDER — BISACODYL 10 MG RE SUPP
10.0000 mg | Freq: Every day | RECTAL | Status: DC | PRN
  Filled 2011-12-03: qty 1

## 2011-12-03 MED ORDER — POLYETHYLENE GLYCOL 3350 17 G PO PACK
17.0000 g | PACK | Freq: Every day | ORAL | Status: DC
  Administered 2011-12-03 – 2011-12-05 (×3): 17 g via ORAL
  Filled 2011-12-03 (×4): qty 1

## 2011-12-03 MED ORDER — ONDANSETRON HCL 4 MG PO TABS: 4.0000 mg | ORAL_TABLET | Freq: Four times a day (QID) | ORAL | Status: DC | PRN

## 2011-12-03 MED ORDER — ENOXAPARIN SODIUM 40 MG/0.4ML ~~LOC~~ SOLN
40.0000 mg | SUBCUTANEOUS | Status: DC
  Administered 2011-12-04 – 2011-12-05 (×2): 40 mg via SUBCUTANEOUS
  Filled 2011-12-03 (×2): qty 0.4

## 2011-12-03 MED ORDER — DULOXETINE HCL 30 MG PO CPEP
30.0000 mg | ORAL_CAPSULE | Freq: Every day | ORAL | Status: DC
  Administered 2011-12-04 – 2011-12-05 (×2): 30 mg via ORAL
  Filled 2011-12-03 (×2): qty 1

## 2011-12-03 MED ORDER — GUAIFENESIN-DM 100-10 MG/5ML PO SYRP: 5.0000 mL | ORAL_SOLUTION | Freq: Four times a day (QID) | ORAL | Status: DC | PRN

## 2011-12-03 MED ORDER — ONDANSETRON HCL 4 MG/2ML IJ SOLN: 4.0000 mg | Freq: Four times a day (QID) | INTRAMUSCULAR | Status: DC | PRN

## 2011-12-03 MED ORDER — ACETAMINOPHEN 325 MG PO TABS
650.0000 mg | ORAL_TABLET | Freq: Three times a day (TID) | ORAL | Status: DC
  Administered 2011-12-03 – 2011-12-05 (×7): 650 mg via ORAL
  Filled 2011-12-03 (×7): qty 2

## 2011-12-03 MED ORDER — LAMOTRIGINE 25 MG PO TABS
50.0000 mg | ORAL_TABLET | Freq: Two times a day (BID) | ORAL | Status: DC
  Administered 2011-12-03 – 2011-12-05 (×4): 50 mg via ORAL
  Filled 2011-12-03 (×6): qty 2

## 2011-12-03 MED ORDER — TAMSULOSIN HCL 0.4 MG PO CAPS
0.8000 mg | ORAL_CAPSULE | Freq: Every day | ORAL | Status: DC
  Administered 2011-12-04: 0.8 mg via ORAL
  Filled 2011-12-03 (×3): qty 2

## 2011-12-03 MED ORDER — BISACODYL 10 MG RE SUPP
10.0000 mg | Freq: Once | RECTAL | Status: AC
  Administered 2011-12-03: 10 mg via RECTAL

## 2011-12-03 MED ORDER — TRAZODONE HCL 50 MG PO TABS: 25.0000 mg | ORAL_TABLET | Freq: Every evening | ORAL | Status: DC | PRN

## 2011-12-03 MED ORDER — DULOXETINE HCL 60 MG PO CPEP
60.0000 mg | ORAL_CAPSULE | Freq: Every day | ORAL | Status: DC
  Administered 2011-12-04 – 2011-12-05 (×2): 60 mg via ORAL
  Filled 2011-12-03 (×3): qty 1

## 2011-12-03 NOTE — Progress Notes (Signed)
Patient ID: [name] [name], male   DOB: 06-09-1945, 66 y.o.   MRN: [id number] Refusing to talk. Upset because he is being transferred to the rehabilitation unit.wound dry. No weakness.

## 2011-12-03 NOTE — Progress Notes (Addendum)
Report called to unit 4000 CIR,  Given to [employer]. Pt will go to room 4008.  Charge RN Enrique Sack contacting Dr Jeral Fruit to request d/c to rehab order and do discharge summary prior to pt going to CIR.  This was relayed to Almira Coaster, RN on unit 4000/CIR.   Pt has agreed to go to CIR, took his medicines this afternoon and is in a pleasant mood. Pt worked with therapy this afternoon and did well per wife.

## 2011-12-03 NOTE — Progress Notes (Signed)
Pt upset at this time, he thought he was getting to go home today, but needs continued therapy and plan for CIR today if medically ready. CIR coordinator just in and spoke with wife and pt about going to rehab. 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 Pt very flat at this time. Attempted to explain needs for CIR, pt not wanting to talk. At this time did not continue to press pt to take meds and discuss CIR. Pt had been asleep prior to CIR coordinator talking to wife then he awakened and informed of plan for CIR, pt had his plan of going home today.  Will allow pt time to process info and attempt meds again later.

## 2011-12-03 NOTE — Clinical Social Work Note (Signed)
Clinical Social Work  Pt will be going to CIR at discharge once medically stable. CSW is signing off at this time as no further needs identified. Please reconsult if a need arises prior to discharge.   Dede Query, MSW, Theresia Majors [phone]

## 2011-12-03 NOTE — Progress Notes (Signed)
Pt arrived to unit at 1825 with wife at bedside. Rehab booklet given to pt. Reviewed rehab process and pt signed safety sheet with full understanding. Belongings at bedside. Call bell within reach and bed alarm on.

## 2011-12-03 NOTE — PMR Pre-admission (Signed)
PMR Admission Coordinator Pre-Admission Assessment  Patient: [name] [name] is an 66 y.o., male MRN: [id number] DOB: Sep 14, 1945 Height: 5\' 11"  (180.3 cm) Weight: 97.2 kg (214 lb 4.6 oz)  Insurance Information HMO:  No    PPO:       PCP:       IPA:       80/20:       OTHER:   PRIMARY: Medicare A/B      Policy#: [id number]      Subscriber: [name] [name] CM Name:        Phone#:       Fax#:   Pre-Cert#:        Employer: Retired Benefits:  Phone #:       Name: Armed forces training and education officer. Date: 02/21/11     Deduct: $1184      Out of Pocket Max: none      Life Max: unlimited CIR: 100%      SNF: 100 days   LBD = none Outpatient: 80%     Co-Pay: 20% Home Health: 100%      Co-Pay: none DME: 80%     Co-Pay: 20% Providers: patient's choice  SECONDARY: BCBS PPO out of state      Policy#: [id number]      Subscriber: [name] [name] CM Name:        Phone#:       Fax#:   Pre-Cert#:        Employer: Early retirement Benefits:  Phone #: [phone]     Name:   Eff. 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 male with history of CAD, CVA, progressive LBP with radiculopathy BLE. Work up with severe stenosis L4-5 with facet arthropathy and patient elected to undergo L3 and L4 laminectomy with decompression and fusion L4-5 by Dr. Jeral Fruit on 11/26/11. Post op with sedation and lethargy question narcotic induced. Continues to have confusion. Therapy ongoing and patient needs cues and redirection for sequencing due to perseveration. MDrecommending CIR for progression.   Past Medical  History  Past Medical History  Diagnosis Date  . Arthritis   . Depression     STARTED AFTER  OPEN HEART SURG.  . Stroke     2008  RIGHT SIDE...HAD FOR A COUPLE OF DAYS AND THEN WENT AWAY  . Stroke     WENT TO [place]  2008  . Neuropathy     [facility]        SEES  DR. Corrinne Eagle  . Coronary artery disease   . Hypertension     Family History  family history is not on file.  Prior Rehab/Hospitalizations:  None   Current Medications  Current facility-administered medications:acetaminophen (TYLENOL) suppository 650 mg, 650 mg, Rectal, Q4H PRN, Karn Cassis, MD;  acetaminophen (TYLENOL) tablet 650 mg, 650 mg,  Oral, Q4H PRN, Karn Cassis, MD, 650 mg at 12/01/11 1822;  bisacodyl (DULCOLAX) EC tablet 5 mg, 5 mg, Oral, Daily PRN, Karn Cassis, MD, 5 mg at 12/01/11 1725;  bisacodyl (DULCOLAX) suppository 10 mg, 10 mg, Rectal, Daily PRN, Karn Cassis, MD DULoxetine (CYMBALTA) DR capsule 30 mg, 30 mg, Oral, QAC lunch, Karn Cassis, MD, 30 mg at 12/02/11 1308;  DULoxetine (CYMBALTA) DR capsule 60 mg, 60 mg, Oral, Q breakfast, Karn Cassis, MD, 60 mg at 12/03/11 0816;  fenofibrate tablet 160 mg, 160 mg, Oral, Daily, Karn Cassis, MD, 160 mg at 12/02/11 1043;  hydrALAZINE (APRESOLINE) injection 10 mg, 10 mg, Intravenous, Q4H PRN, Karn Cassis, MD, 10 mg at 11/28/11 1808 HYDROcodone-acetaminophen (NORCO) 10-325 MG per tablet 1 tablet, 1 tablet, Oral, Q4H PRN, Karn Cassis, MD, 1 tablet at 12/03/11 0402;  lamoTRIgine (LAMICTAL) tablet 50 mg, 50 mg, Oral, BID, Karn Cassis, MD, 50 mg at 12/02/11 2225;  menthol-cetylpyridinium (CEPACOL) lozenge 3 mg, 1 lozenge, Oral, PRN, Karn Cassis, MD;  metoprolol succinate (TOPROL-XL) 24 hr tablet 25 mg, 25 mg, Oral, BID, Karn Cassis, MD, 25 mg at 12/02/11 2225 ondansetron (ZOFRAN) injection 4 mg, 4 mg, Intravenous, Q4H PRN, Karn Cassis, MD;  phenol (CHLORASEPTIC) mouth spray 1 spray, 1 spray, Mouth/Throat, PRN, Karn Cassis, MD;  simvastatin (ZOCOR) tablet 40 mg, 40 mg, Oral, QHS, Karn Cassis, MD, 40 mg at 12/02/11 2225;  sodium phosphate (FLEET) 7-19 GM/118ML enema 1 enema, 1 enema, Rectal, Daily PRN, Karn Cassis, MD Tamsulosin HCl [facility]) capsule 0.8 mg, 0.8 mg, Oral, Daily, Karn Cassis, MD, 0.8 mg at 12/02/11 1043;  [DISCONTINUED] oxyCODONE (Oxy IR/ROXICODONE) immediate release tablet 5 mg, 5 mg, Oral, Q4H PRN, Reinaldo Meeker, MD, 5 mg at 12/02/11 1043  Patients Current Diet: General  Precautions / Restrictions Precautions Precautions: Fall Precaution Booklet Issued: Yes (comment) Precaution Comments: Pt stated that it bothered his back when he bent it---reiterated that he is not suppose to bend Spinal Brace: Applied in sitting position (total A to don) Restrictions Weight Bearing Restrictions: No   Prior Activity Level Limited Community (1-2x/wk): Went out 3-4 X a week  Journalist, newspaper / Equipment Home Assistive Devices/Equipment: Cane (specify quad or straight);Other (Comment) (straight cane prn) Home Adaptive Equipment: Straight cane  Prior Functional Level Prior Function Level of Independence: Independent Able to Take Stairs?: Yes Driving: Yes Vocation: Retired  Current Functional Level Cognition  Arousal/Alertness:  (pt in a "daze") Overall Cognitive Status: Impaired Current Attention Level: Sustained Attention - Other Comments: internally distracted Memory: Decreased recall of precautions Orientation Level: Disoriented to time Following Commands: Follows one step commands inconsistently Safety/Judgement: Decreased awareness of safety precautions;Decreased safety judgement for tasks assessed Cognition - Other Comments: cognition most likely affected by pain meds    Extremity Assessment (includes Sensation/Coordination)  RUE ROM/Strength/Tone: Within functional levels  RLE ROM/Strength/Tone: Within functional levels RLE Sensation: WFL - Light Touch      ADLs  Grooming: Performed;Wash/dry face;Teeth care;Minimal assistance (with Mod cues and varied A to maintain standing-post. lean) Where Assessed - Grooming: Supported standing Upper Body Bathing: Maximal assistance Lower Body Bathing: +1 Total assistance Upper Body Dressing: Maximal assistance Lower Body Dressing: +1 Total assistance Toilet Transfer: Simulated;Moderate assistance Toilet Transfer Method: Sit to stand Toilet Transfer Equipment:  (Bed, into bathroom to groom, out into hallway, to recliner) Equipment Used: Back brace;Rolling walker;Gait belt Transfers/Ambulation Related to ADLs: Mod A sit to  stand and stand to sit; total A+2 (pt=80%) for ambulation due to safety ADL Comments: Focus on increasing alertness and mobility. Pt lethargic. Educated pt on back precautions. Pt able to state 2/3 precautionsafter mod vc.    Mobility  Bed Mobility: Rolling Left;Left Sidelying to Sit Rolling Right: 3: Mod assist Rolling Left: 4: Min assist (v/c's to stay on task) Left Sidelying to Sit: 2: Max assist Sitting - Scoot to Edge of Bed: 5: Supervision Sit to Sidelying Left: 3: Mod assist    Transfers  Transfers: Sit to Stand;Stand to Sit Sit to Stand: With upper extremity assist;From bed;3: Mod assist Sit to Stand: Patient Percentage: 50% Stand to Sit: 3: Mod assist;With upper extremity assist;To chair/3-in-1 Stand to Sit: Patient Percentage: 50% Stand Pivot Transfers: 1: +2 Total assist Stand Pivot Transfers: Patient Percentage: 60%    Ambulation / Gait / Stairs / Wheelchair Mobility  Ambulation/Gait Ambulation/Gait Assistance: 1: +2 Total assist Ambulation/Gait: Patient Percentage: 80% Ambulation Distance (Feet): 20 Feet Assistive device: Rolling walker Ambulation/Gait Assistance Details: pt con't to required +2 for safety due to tremors, cognitive deficits and retroplulsion. Gait Pattern: Step-through pattern;Decreased step length - right;Decreased step length - left Gait  velocity: decreased    Posture / Balance Static Standing Balance Static Standing - Balance Support: Bilateral upper extremity supported Static Standing - Level of Assistance: 3: Mod assist Static Standing - Comment/# of Minutes: Pt able to stance three times for ~2 minutes prior to shaking of knees and weakness requiring sitting down. mod assist for support through 2 HHA in standing Dynamic Standing Balance Dynamic Standing - Balance Support: During functional activity;Left upper extremity supported Dynamic Standing - Level of Assistance: 4: Min assist (varied to modA with fatigue) Dynamic Standing - Balance Activities:  (grooming activities with OT) Dynamic Standing - Comments: pt con't to require tactile and verbal cues to minimize posterior lean and adhere to back precautions, pt with minimal problem solving to correct LOB     Previous Home Environment Living Arrangements: Spouse/significant other Lives With: Spouse Available Help at Discharge: Family;Available 24 hours/day Type of Home: House Home Layout: One level Home Access: Stairs to enter Entrance Stairs-Rails: Right;Left;Can reach both Entrance Stairs-Number of Steps: 3 Bathroom Shower/Tub: Forensic scientist: Standard Bathroom Accessibility: Yes How Accessible: Accessible via walker Home Care Services: No  Discharge Living Setting Plans for Discharge Living Setting: Patient's home;House;Lives with (comment) (Lives with wife.) Type of Home at Discharge: House Discharge Home Layout: One level Discharge Home Access: Stairs to enter Entrance Stairs-Number of Steps: 3 steps back and 3-4 steps front entry. Do you have any problems obtaining your medications?: No  Social/Family/Support Systems Patient Roles: Spouse;Parent Contact Information: [name] Monte Nurse - wife (h) [phone] (c) [phone] Anticipated Caregiver: wife (Has a Dtr and a son who work.) Ability/Limitations of Caregiver: Wife retired early and can  assist. Caregiver Availability: 24/7 Discharge Plan Discussed with Primary Caregiver: Yes Is Caregiver In Agreement with Plan?: Yes Does Caregiver/Family have Issues with Lodging/Transportation while Pt is in Rehab?: No  Goals/Additional Needs Patient/Family Goal for Rehab: PT/OT S goals, need ST eval for encephalopathy Expected length of stay: 10-12 days Cultural Considerations: None Dietary Needs: Regular diet Equipment Needs: TBD Pt/Family Agrees to Admission and willing to participate: Yes Program Orientation Provided & Reviewed with Pt/Caregiver Including Roles  & Responsibilities: Yes  Patient Condition: This patient's condition remains as documented in the Consult dated 12/02/11, in which the Rehabilitation Physician determined and documented that the patient's condition is appropriate for  intensive rehabilitative care in an inpatient rehabilitation facility.  Preadmission Screen Completed By:  Trish Mage, 12/03/2011 10:41 AM ______________________________________________________________________   Discussed status with Dr. Wynn Banker on 12/03/11 at 1048 and received telephone approval for admission today.  Admission Coordinator:  Trish Mage, time1048/Date11/13/13

## 2011-12-03 NOTE — Progress Notes (Signed)
Physical Therapy Treatment Patient Details Name: [name] [name] MRN: [id number] DOB: Jul 01, 1945 Today's Date: 12/03/2011 Time: 4098-1191 PT Time Calculation (min): 25 min  PT Assessment / Plan / Recommendation Comments on Treatment Session  Pt initially upset re: going to rehab but then agreeable to it s/p max encouragment and education on benefits. Pt with significant mobilty improvement this date and anticipate patient to progress well in CIR.    Follow Up Recommendations  CIR;Supervision/Assistance - 24 hour     Does the patient have the potential to tolerate intense rehabilitation     Barriers to Discharge        Equipment Recommendations  Rolling walker with 5" wheels;3 in 1 bedside comode    Recommendations for Other Services Rehab consult  Frequency Min 5X/week   Plan Frequency remains appropriate;Other (comment);Discharge plan needs to be updated    Precautions / Restrictions Precautions Precautions: Fall;Back Required Braces or Orthoses: Spinal Brace Spinal Brace: Applied in sitting position Restrictions Weight Bearing Restrictions: No   Pertinent Vitals/Pain "not to bad" referring to back pain.    Mobility  Bed Mobility Bed Mobility: Rolling Right;Right Sidelying to Sit Rolling Right: 4: Min assist;With rail Right Sidelying to Sit: 4: Min assist;With rails;HOB flat Details for Bed Mobility Assistance: increased time Transfers Transfers: Sit to Stand;Stand to Sit Sit to Stand: 4: Min assist;From bed;With upper extremity assist Stand to Sit: 4: Min assist;With upper extremity assist;With armrests Details for Transfer Assistance: increased time. Pt with twisting on sit --> stand requiring v/c's and assist to achieve full upright position. Pt reports "I'm just afraid of the pain. Ambulation/Gait Ambulation/Gait Assistance: 4: Min assist Ambulation Distance (Feet): 150 Feet (x2, with 5 min rest break) Assistive device: Rolling walker Ambulation/Gait Assistance  Details: v/c's initially for walker safety. pt with improved ability to maintain upright posture without retropulsion.  Gait velocity: WFL General Gait Details: pt with 3 episodes of bilat knee instability however pt able to maintain balance Stairs: No    Exercises     PT Diagnosis:    PT Problem List:   PT Treatment Interventions:     PT Goals Acute Rehab PT Goals PT Goal: Supine/Side to Sit - Progress: Progressing toward goal PT Goal: Sit to Supine/Side - Progress: Progressing toward goal PT Goal: Sit to Stand - Progress: Progressing toward goal PT Goal: Stand to Sit - Progress: Progressing toward goal PT Transfer Goal: Bed to Chair/Chair to Bed - Progress: Progressing toward goal PT Goal: Ambulate - Progress: Progressing toward goal PT Goal: Up/Down Stairs - Progress: Not met  Visit Information  Last PT Received On: 12/03/11 Assistance Needed: +1    Subjective Data  Subjective: Pt received supine in bed. Spoke extensively with RN and wife re: patient upset about rehab.   Cognition  Overall Cognitive Status:  (delayed processing) Orientation Level: Appears intact for tasks assessed Behavior During Session: [facility] for tasks performed Current Attention Level: Sustained    Balance     End of Session PT - End of Session Equipment Utilized During Treatment: Gait belt;Back brace Activity Tolerance: Patient tolerated treatment well Patient left: in chair;with call bell/phone within reach;with family/visitor present Nurse Communication: Mobility status   GP     Marcene Brawn 12/03/2011, 4:51 PM  Lewis Shock, PT, DPT Pager #: [phone] Office #: [phone]

## 2011-12-03 NOTE — Progress Notes (Signed)
Rehab admissions - I spoke with wife.  Patient very sleepy at this time.  Wife in agreement to inpatient rehab stay prior to home.  Wife has been staying with patient.  I gave wife rehab booklets and explained inpatient rehab.  I did speak with patient and explain rehab as well, but he kept falling back to sleep.  Bed available and can admit to inpatient rehab today.  Call me for questions.  #[phone]

## 2011-12-04 ENCOUNTER — Inpatient Hospital Stay (HOSPITAL_COMMUNITY): Payer: Medicare Other | Admitting: Speech Pathology

## 2011-12-04 ENCOUNTER — Inpatient Hospital Stay (HOSPITAL_COMMUNITY): Payer: Medicare Other | Admitting: Occupational Therapy

## 2011-12-04 ENCOUNTER — Inpatient Hospital Stay (HOSPITAL_COMMUNITY): Payer: Medicare Other | Admitting: Physical Therapy

## 2011-12-04 DIAGNOSIS — M48061 Spinal stenosis, lumbar region without neurogenic claudication: Secondary | ICD-10-CM

## 2011-12-04 LAB — TSH: TSH: 2.353 u[IU]/mL (ref 0.350–4.500)

## 2011-12-04 LAB — CBC WITH DIFFERENTIAL/PLATELET
Eosinophils Absolute: 0.1 10*3/uL (ref 0.0–0.7)
Eosinophils Relative: 3 % (ref 0–5)
Hemoglobin: 10.1 g/dL — ABNORMAL LOW (ref 13.0–17.0)
Lymphocytes Relative: 32 % (ref 12–46)
Lymphs Abs: 1.8 10*3/uL (ref 0.7–4.0)
MCH: 32.9 pg (ref 26.0–34.0)
MCV: 95.8 fL (ref 78.0–100.0)
Monocytes Relative: 10 % (ref 3–12)
Platelets: 205 10*3/uL (ref 150–400)
RBC: 3.07 MIL/uL — ABNORMAL LOW (ref 4.22–5.81)
WBC: 5.5 10*3/uL (ref 4.0–10.5)

## 2011-12-04 LAB — URINALYSIS, ROUTINE W REFLEX MICROSCOPIC
Bilirubin Urine: NEGATIVE
Ketones, ur: NEGATIVE mg/dL
Nitrite: NEGATIVE
Protein, ur: NEGATIVE mg/dL
Urobilinogen, UA: 0.2 mg/dL (ref 0.0–1.0)

## 2011-12-04 LAB — COMPREHENSIVE METABOLIC PANEL
ALT: 24 U/L (ref 0–53)
Alkaline Phosphatase: 52 U/L (ref 39–117)
BUN: 17 mg/dL (ref 6–23)
CO2: 27 mEq/L (ref 19–32)
GFR calc Af Amer: >90 mL/min (ref >90)
GFR calc non Af Amer: >90 mL/min (ref >90)
Glucose, Bld: 129 mg/dL — ABNORMAL HIGH (ref 70–99)
Potassium: 4 mEq/L (ref 3.5–5.1)
Sodium: 140 mEq/L (ref 135–145)

## 2011-12-04 LAB — VITAMIN B12: Vitamin B-12: 600 pg/mL (ref 211–911)

## 2011-12-04 LAB — URINE MICROSCOPIC-ADD ON

## 2011-12-04 MED ORDER — POLYETHYLENE GLYCOL 3350 17 G PO PACK: 17.0000 g | PACK | Freq: Every day | ORAL | Status: DC

## 2011-12-04 MED ORDER — ACETAMINOPHEN 325 MG PO TABS: 650.0000 mg | ORAL_TABLET | Freq: Three times a day (TID) | ORAL | Status: DC

## 2011-12-04 MED ORDER — TRAMADOL HCL 50 MG PO TABS: 50.0000 mg | ORAL_TABLET | Freq: Four times a day (QID) | ORAL | Status: DC | PRN

## 2011-12-04 NOTE — H&P (Addendum)
Physical Medicine and Rehabilitation Admission H&P    CC: Back pain with radiculopathy BLE  HPI: [name] [name] is a 66 y.o. male with history of CAD, CVA, progressive LBP with radiculopathy BLE. Work up with severe stenosis L4-5 with facet arthropathy and patient elected to undergo L3 and L4 laminectomy with decompression and fusion L4-5 by Dr. Jeral Fruit on 11/26/11. Post op with sedation and lethargy that's improving. He continues to be confused despite tapering of narcotics. Family agreeable to have foley removed yesterday.  He continues to have poor safety awareness requiring assistance with all mobility, tremors and retropulsion with mobility. Therapy ongoing and CIR recommended for progression. patient needs cues and redirection for sequencing due to perseveration  Pt unsure about CIR but made decision Review of Systems  HENT: Negative for hearing loss.   Eyes: Negative for blurred vision and double vision.  Respiratory: Negative for shortness of breath and wheezing.   Cardiovascular: Negative for chest pain and palpitations.  Gastrointestinal: Positive for constipation. Negative for nausea and vomiting.  Genitourinary: Negative for urgency and frequency.  Musculoskeletal: Positive for myalgias and back pain.  Neurological: Negative for dizziness and headaches.   Past Medical History  Diagnosis Date  . Arthritis   . Depression     STARTED AFTER  OPEN HEART SURG.  . Stroke     2008  RIGHT SIDE...HAD FOR A COUPLE OF DAYS AND THEN WENT AWAY  . Stroke     WENT TO [place]  2008  . Neuropathy     FIBRO        SEES  DR. Corrinne Eagle  . Coronary artery disease   . Hypertension    Past Surgical History  Procedure Date  . Coronary artery bypass graft     (2010 @ [facility]  . Hand surgery     CRUSHING INJURY---1966  HAS ABOUT 6 SURGERIES THEN  . Hernia repair     BILATERAL  . Eye surgery     ?? BLEPHOPLASTY  . Lumbar fusion 11/25/2011    POSTERIOR   History reviewed. No pertinent family  history.  Social History: Married. Retired Nature conservation officer. Wife reports that he quit smoking about 3 years ago. He started smoking about 43 years ago. He quit smokeless tobacco use about 5 years ago. Wife reports that he drinks about 3 shots of liquor about 5 times a week--more recently due to pain issues.  She reports that he does not use illicit drugs. Wife retired and supportive.   Allergies: No Known Allergies   Scheduled Meds:    . acetaminophen  650 mg Oral TID WC & HS  . [COMPLETED] bisacodyl  10 mg Rectal Once  . DULoxetine  30 mg Oral QAC lunch  . DULoxetine  60 mg Oral Q breakfast  . enoxaparin  40 mg Subcutaneous Q24H  . fenofibrate  160 mg Oral Daily  . lamoTRIgine  50 mg Oral BID  . metoprolol succinate  25 mg Oral BID  . polyethylene glycol  17 g Oral Daily  . simvastatin  40 mg Oral QHS  . Tamsulosin HCl  0.8 mg Oral Daily    Medications Prior to Admission  Medication Sig Dispense Refill  . Cholecalciferol (VITAMIN D-3) 1000 UNITS CAPS Take 1 capsule by mouth daily.      . Choline Fenofibrate (TRILIPIX) 135 MG capsule Take 135 mg by mouth at bedtime.      . DULoxetine (CYMBALTA) 30 MG capsule Take 30-60 mg by mouth 2 (two)  times daily. 2 after breakfast  1 after lunch      . HYDROcodone-acetaminophen (VICODIN) 5-500 MG per tablet Take 1 tablet by mouth daily.      [name] lamoTRIgine (LAMICTAL) 25 MG tablet Take 50 mg by mouth 2 (two) times daily. On 10/9- 1 tablet twice daily On 10/23-2 tablets twice daily On 11/6-3 tablets three times daily      . metoprolol succinate (TOPROL-XL) 25 MG 24 hr tablet Take 25 mg by mouth 2 (two) times daily.       [name] OVER THE COUNTER MEDICATION Place 1-2 drops into both eyes daily. Multi Action Relief Lubricant Eye Drop OTC      . predniSONE (DELTASONE) 10 MG tablet Take 10-60 mg by mouth See admin instructions. Starts 10/31  Tapered dose.      . simvastatin (ZOCOR) 40 MG tablet Take 40 mg by mouth at bedtime.      . triamcinolone cream  (KENALOG) 0.1 % Apply 1 application topically 3 (three) times daily as needed. To affected area chest.        Home:     Functional History:    Functional Status:  Mobility:     Ambulation/Gait Ambulation/Gait Assistance: 2: Max assist    ADL:    Cognition: Cognition Orientation Level: Oriented X4     Blood pressure 115/67, pulse 79, temperature 97.9 F (36.6 C), temperature source Oral, resp. rate 18, SpO2 98.00%. Physical Exam  Nursing note and vitals reviewed. Constitutional: He appears well-developed and well-nourished.  HENT:  Head: Normocephalic and atraumatic.  Eyes: Pupils are equal, round, and reactive to light.  Neck: Normal range of motion. Neck supple.  Cardiovascular: Normal rate and regular rhythm.   Pulmonary/Chest: Effort normal and breath sounds normal.  Abdominal: Soft. Bowel sounds are normal. He exhibits no distension. There is no tenderness.  Musculoskeletal: He exhibits no edema.       Back incision clean and dry with sutures in place.   Neurological: He is alert.       Clearer today. Follows basic commands without difficulty.   Moto:  3-/5 in B HF, KE,ADF, 5/5 in Bi Delt Bi Tri 4/5 R grip 5/5 L grip Decreased sensation in both feet  Results for orders placed during the hospital encounter of 12/03/11 (from the past 48 hour(s))  URINALYSIS, ROUTINE W REFLEX MICROSCOPIC     Status: Abnormal   Collection Time   12/04/11  4:30 AM      Component Value Range Comment   Color, Urine YELLOW  YELLOW    APPearance CLOUDY (*) CLEAR    Specific Gravity, Urine 1.013  1.005 - 1.030    pH 6.0  5.0 - 8.0    Glucose, UA NEGATIVE  NEGATIVE mg/dL    Hgb urine dipstick NEGATIVE  NEGATIVE    Bilirubin Urine NEGATIVE  NEGATIVE    Ketones, ur NEGATIVE  NEGATIVE mg/dL    Protein, ur NEGATIVE  NEGATIVE mg/dL    Urobilinogen, UA 0.2  0.0 - 1.0 mg/dL    Nitrite NEGATIVE  NEGATIVE    Leukocytes, UA SMALL (*) NEGATIVE   URINE MICROSCOPIC-ADD ON     Status:  Abnormal   Collection Time   12/04/11  4:30 AM      Component Value Range Comment   Squamous Epithelial / LPF RARE  RARE    WBC, UA 3-6  <3 WBC/hpf    RBC / HPF 0-2  <3 RBC/hpf    Bacteria, UA FEW (*) RARE  CBC WITH DIFFERENTIAL     Status: Abnormal   Collection Time   12/04/11  6:00 AM      Component Value Range Comment   WBC 5.5  4.0 - 10.5 K/uL    RBC 3.07 (*) 4.22 - 5.81 MIL/uL    Hemoglobin 10.1 (*) 13.0 - 17.0 g/dL    HCT 16.1 (*) 09.6 - 52.0 %    MCV 95.8  78.0 - 100.0 fL    MCH 32.9  26.0 - 34.0 pg    MCHC 34.4  30.0 - 36.0 g/dL    RDW 04.5  40.9 - 81.1 %    Platelets 205  150 - 400 K/uL    Neutrophils Relative 55  43 - 77 %    Neutro Abs 3.1  1.7 - 7.7 K/uL    Lymphocytes Relative 32  12 - 46 %    Lymphs Abs 1.8  0.7 - 4.0 K/uL    Monocytes Relative 10  3 - 12 %    Monocytes Absolute 0.6  0.1 - 1.0 K/uL    Eosinophils Relative 3  0 - 5 %    Eosinophils Absolute 0.1  0.0 - 0.7 K/uL    Basophils Relative 0  0 - 1 %    Basophils Absolute 0.0  0.0 - 0.1 K/uL   COMPREHENSIVE METABOLIC PANEL     Status: Abnormal   Collection Time   12/04/11  6:00 AM      Component Value Range Comment   Sodium 140  135 - 145 mEq/L    Potassium 4.0  3.5 - 5.1 mEq/L    Chloride 102  96 - 112 mEq/L    CO2 27  19 - 32 mEq/L    Glucose, Bld 129 (*) 70 - 99 mg/dL    BUN 17  6 - 23 mg/dL    Creatinine, Ser 9.14  0.50 - 1.35 mg/dL    Calcium 9.9  8.4 - 78.2 mg/dL    Total Protein 6.6  6.0 - 8.3 g/dL    Albumin 3.1 (*) 3.5 - 5.2 g/dL    AST 24  0 - 37 U/L    ALT 24  0 - 53 U/L    Alkaline Phosphatase 52  39 - 117 U/L    Total Bilirubin 0.3  0.3 - 1.2 mg/dL    GFR calc non Af Amer >90  >90 mL/min    GFR calc Af Amer >90  >90 mL/min    No results found.  Post Admission Physician Evaluation: 1. Functional deficits secondary  to Lumbar spinal stenosis with radiculopathy s/p decompression L3-4,4-5 and fusion L4-5. 2. Patient is admitted to receive collaborative, interdisciplinary care  between the physiatrist, rehab nursing staff, and therapy team. 3. Patient's level of medical complexity and substantial therapy needs in context of that medical necessity cannot be provided at a lesser intensity of care such as a SNF. 4. Patient has experienced substantial functional loss from his/her baseline which was documented above under the "Functional History" and "Functional Status" headings.  Judging by the patient's diagnosis, physical exam, and functional history, the patient has potential for functional progress which will result in measurable gains while on inpatient rehab.  These gains will be of substantial and practical use upon discharge  in facilitating mobility and self-care at the household level. 5. Physiatrist will provide 24 hour management of medical needs as well as oversight of the therapy plan/treatment and provide guidance as appropriate regarding the interaction of the  two. 6. 24 hour rehab nursing will assist with bladder management, bowel management, safety, skin/wound care, disease management, medication administration, pain management and patient education  and help integrate therapy concepts, techniques,education, etc. 7. PT will assess and treat for:  Pre gait, gait, safety ,endurance equip.  Goals are: Mod I Mobility. 8. OT will assess and treat for: ADL , cog/percpt, endurance safety ,equip.   Goals are: Mod I ADL. 9. SLP will assess and treat for: NA.  Goals are: NA. 10. Case Management and Social Worker will assess and treat for psychological issues and discharge planning. 11. Team conference will be held weekly to assess progress toward goals and to determine barriers to discharge. 12. Patient will receive at least 3 hours of therapy per day at least 5 days per week. 13. ELOS: 7-10 day      Prognosis:  good   Medical Problem List and Plan: 1. DVT Prophylaxis/Anticoagulation: Pharmaceutical: Lovenox 2. Pain Management: discontinue vicodin. Will schedule tylenol  and add Kpad.  Use ultram prn 3. Mood:  Continue cymbalta and Lamictal for now. Hemmer need to taper if mentation does not improve.  Difficulty to discern due to ongoing confusion. Will monitor as patient clears.  LCSW to follow up for evaluation.  4. Neuropsych: This patient is not capable of making decisions on his/her own behalf. 5. Neuropathy: continue chronic neurontin 6. Encephalopathy: due to delirium v/s medication effect v/s ETOH withdrawal. Will discontinue hydrocodone. Check Vit B 12, TSH levels. Check lytes to rule out electrolyte abnormality. Check UA/UCS. ?CT head to rule out ischemia.  7. CAD: On zocor at bedtime and metoprolol bid. 8.  Urinary retention? On flomax 0.8mg . Will check PVRs and UA/UCS. Foley discontinued yesterday.  9.  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

## 2011-12-04 NOTE — Progress Notes (Signed)
Occupational Therapy Session Note  Patient Details  Name: [name] [name] MRN: [id number] Date of Birth: Aug 15, 1945  Today's Date: 12/04/2011 Time: 8295-6213 Time Calculation (min): 45 min   Skilled Therapeutic Interventions/Progress Updates:  Patient seen this am for 1:1 OT intervention to address tub/shower transfers.  Patient confirmed that he has a tub / shower combination and equipment from his mother-in-law (bedside commode, grab bars,  and tub transfer bench.)  Practiced tub shower transfer with both step in method, and with tub transfer bench in environment simulated as his own.  Patient could effectively utilize either method after verbal instruction.  Patient walked to ADL apartment with rolling walker and supervision.  Patient with 1 minor loss of balance while distracted by passerby, while turning.  Patient able to recover balance without difficulty.  Patient stating pain is resolving, which he attributes to moving more. Therapy Documentation Precautions:  Precautions Precautions: Back;Fall Required Braces or Orthoses: Spinal Brace Spinal Brace: Applied in sitting position Restrictions Weight Bearing Restrictions: No General: General Chart Reviewed: Yes Family/Caregiver Present: No   Pain: Patient reports 4/10 at beginning of session.  He reported a momentary 8/10 when transitioning from sit to stand, but pain lasted only during that movement, and returned to 4/10    See FIM for current functional status  Therapy/Group: Individual Therapy  Collier Salina 12/04/2011, 11:21 AM

## 2011-12-04 NOTE — Evaluation (Signed)
Occupational Therapy Assessment and Plan & Treatment Session Note  Patient Details  Name: [name] [name] MRN: [id number] Date of Birth: 1946-01-09  OT Diagnosis: acute pain and muscle weakness (generalized) Rehab Potential: Rehab Potential: Good ELOS: 3-4 days   Today's Date: 12/04/2011 Time: 0930-1030 Time Calculation (min): 60 min  Problem List: There is no problem list on file for this patient.   Past Medical History:  Past Medical History  Diagnosis Date  . Arthritis   . Depression     STARTED AFTER  OPEN HEART SURG.  . Stroke     2008  RIGHT SIDE...HAD FOR A COUPLE OF DAYS AND THEN WENT AWAY  . Stroke     WENT TO [place]  2008  . Neuropathy     FIBRO        SEES  DR. Corrinne Eagle  . Coronary artery disease   . Hypertension    Past Surgical History:  Past Surgical History  Procedure Date  . Coronary artery bypass graft     (2010 @ [facility]  . Hand surgery     CRUSHING INJURY---1966  HAS ABOUT 6 SURGERIES THEN  . Hernia repair     BILATERAL  . Eye surgery     ?? BLEPHOPLASTY  . Lumbar fusion 11/25/2011    POSTERIOR    Assessment & Plan Clinical Impression: Patient is a 66 y.o. male with history of CAD, CVA, progressive LBP with radiculopathy BLE. Work up with severe stenosis L4-5 with facet arthropathy and patient elected to undergo L3 and L4 laminectomy with decompression and fusion L4-5 by Dr. Jeral Fruit on 11/26/11. Post op with sedation and lethargy that's improving. He continues to be confused despite tapering of narcotics. Family agreeable to have foley removed yesterday. He continues to have poor safety awareness requiring assistance with all mobility, tremors and retropulsion with mobility. Patient transferred to CIR on 12/03/2011 .    Patient currently requires Min A for LB bathing, Mod A for LB dressing, and Min A for sit<>stands with  secondary to back precautions, acute pain, muscle weakness and decreased standing balance.  Prior to hospitalization, patient could  complete ADLs with Independently with Min A for LB dressing.  Patient will benefit from skilled intervention to decrease level of assist with basic self-care skills and increase independence with basic self-care skills prior to discharge home with care partner (spouse).  Anticipate patient will require minimal physical assistance and no further OT follow recommended.  OT - End of Session Activity Tolerance: Tolerates 30+ min activity with multiple rests Endurance Deficit: Yes Endurance Deficit Description: increased fatigue after bathing/dressing session and multiple sit<>stands OT Assessment Rehab Potential: Good Barriers to Discharge: None OT Plan OT Frequency: 1-2 X/day, 60-90 minutes Estimated Length of Stay: 3-4 days OT Treatment/Interventions: Balance/vestibular training;Community reintegration;Discharge planning;DME/adaptive equipment instruction;Functional mobility training;Pain management;Patient/family education;Self Care/advanced ADL retraining;Splinting/orthotics;Therapeutic Activities;UE/LE Strength taining/ROM OT Recommendation Follow Up Recommendations: None  OT Evaluation Precautions/Restrictions  Precautions Precautions: Back;Fall Required Braces or Orthoses: Spinal Brace Spinal Brace: Applied in sitting position Restrictions Weight Bearing Restrictions: No General Chart Reviewed: Yes Family/Caregiver Present: No Pain Pain Assessment Pain Assessment: No/denies pain Home Living/Prior Functioning Home Living Lives With: Spouse Available Help at Discharge: Family;Available 24 hours/day Type of Home: House Home Access: Stairs to enter [employer] of Steps: 1 Entrance Stairs-Rails: Right;Left;Can reach both Home Layout: One level Bathroom Shower/Tub: Forensic scientist: Standard Bathroom Accessibility: Yes How Accessible: Accessible via walker Home Adaptive Equipment: Bedside commode/3-in-1;Grab bars in shower;Tub  transfer  bench;Straight cane Prior Function Level of Independence: Needs assistance with ADLs Dressing: Minimal (spouse helped with socks and shoes due to back pain) ADL  See FIM Vision/Perception  Vision - History Baseline Vision: Wears glasses only for reading Patient Visual Report: No change from baseline Perception Perception: Within Functional Limits Praxis Praxis: Intact  Cognition Overall Cognitive Status: Appears within functional limits for tasks assessed Arousal/Alertness: Awake/alert Orientation Level: Oriented X4 Memory: Impaired Memory Impairment: Decreased recall of new information Awareness: Appears intact Problem Solving: Appears intact Safety/Judgment: Appears intact Sensation Sensation Light Touch: Appears Intact Proprioception: Appears Intact Coordination Gross Motor Movements are Fluid and Coordinated: Yes Fine Motor Movements are Fluid and Coordinated: Yes (premorbid mild tremor in R UE; did not affect function) Motor  Motor Motor: Within Functional Limits Mobility  Bed Mobility Sit to Sidelying Left: 4: Min assist Sit to Sidelying Left Details: Verbal cues for technique;Verbal cues for precautions/safety Transfers Sit to Stand: 4: Min assist  Trunk/Postural Assessment  Cervical Assessment Cervical Assessment: Within Functional Limits Thoracic Assessment Thoracic Assessment: Within Functional Limits Lumbar Assessment Lumbar Assessment: Exceptions to [facility] (back precautions and spinal brace) Postural Control Postural Control: Within Functional Limits  Balance Static Standing Balance Static Standing - Balance Support: During functional activity Static Standing - Level of Assistance: 6: Modified independent (Device/Increase time) Dynamic Standing Balance Dynamic Standing - Balance Support: During functional activity Dynamic Standing - Level of Assistance: 5: Stand by assistance Extremity/Trunk Assessment RUE Assessment RUE Assessment: Within Functional  Limits (mild tremor that is premorbid) LUE Assessment LUE Assessment: Within Functional Limits  See FIM for current functional status Refer to Care Plan for Long Term Goals  Recommendations for other services: None  Discharge Criteria: Patient will be discharged from OT if patient refuses treatment 3 consecutive times without medical reason, if treatment goals not met, if there is a change in medical status, if patient makes no progress towards goals or if patient is discharged from hospital.  The above assessment, treatment plan, treatment alternatives and goals were discussed and mutually agreed upon: by patient  Treatment Session: OT Eval initiated; role, purpose, and goals for therapy discussed and agreed upon with patient.  Self care retraining with bathing, dressing, and grooming tasks focusing on adherence to back precautions during ADL tasks, functional mobility and transfers with RW, sit<>stands for clothing management, management of spinal brace, dynamic standing balance, bed mobility, and increasing endurance. Pt would benefit from AE for LB bathing and dressing secondary to back precautions.  Jackelyn Poling 12/04/2011, 11:31 AM

## 2011-12-04 NOTE — Progress Notes (Signed)
Physical Therapy Note  Patient Details  Name: [name] [name] Thal MRN: [id number] Date of Birth: [birth year] Today's Date: 12/04/2011  Time: 1400-1430 30 minutes  No c/o pain.  Standing therex for LE strength and endurance 15x each LE.  Standing tap ups and downs for concentric and eccentric quad control with 1 UE support.  Pt's LEs fatigue easily and relies heavily on UEs for quad control.  Overall good participation and motivation to improve.  Individual therapy   DONAWERTH,KAREN 12/04/2011, 3:04 PM

## 2011-12-04 NOTE — Evaluation (Signed)
Physical Therapy Assessment and Plan  Patient Details  Name: [name] [name] MRN: [id number] Date of Birth: 1945-04-08  PT Diagnosis: Abnormality of gait, Difficulty walking and Muscle weakness Rehab Potential: Good ELOS: 2-3 days   Today's Date: 12/04/2011 Time: 7:55-8:55 60 minutes  Problem List: There is no problem list on file for this patient.   Past Medical History:  Past Medical History  Diagnosis Date  . Arthritis   . Depression     STARTED AFTER  OPEN HEART SURG.  . Stroke     2008  RIGHT SIDE...HAD FOR A COUPLE OF DAYS AND THEN WENT AWAY  . Stroke     WENT TO [place]  2008  . Neuropathy     FIBRO        SEES  DR. Corrinne Eagle  . Coronary artery disease   . Hypertension    Past Surgical History:  Past Surgical History  Procedure Date  . Coronary artery bypass graft     (2010 @ [facility]  . Hand surgery     CRUSHING INJURY---1966  HAS ABOUT 6 SURGERIES THEN  . Hernia repair     BILATERAL  . Eye surgery     ?? BLEPHOPLASTY  . Lumbar fusion 11/25/2011    POSTERIOR    Assessment & Plan Clinical Impression: Patient is a 66 y.o. 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 Jeral Fruit on 11/26/11.  Patient transferred to CIR on 12/03/2011 .   Patient currently requires min with mobility secondary to muscle weakness and decreased standing balance.  Prior to hospitalization, patient was independent with mobility and lived with Spouse in a House home.  Home access is 1Stairs to enter.  Patient will benefit from skilled PT intervention to maximize safe functional mobility, minimize fall risk and decrease caregiver burden for planned discharge home with intermittent assist.  Anticipate patient will benefit from follow up OP at discharge.  PT - End of Session Activity Tolerance: Tolerates 30+ min activity with multiple rests PT Assessment Rehab Potential: Good PT  Plan PT Frequency: 1-2 X/day, 60-90 minutes Estimated Length of Stay: 2-3 days PT Treatment/Interventions: Ambulation/gait training;Balance/vestibular training;Discharge planning;Psychosocial support;Functional mobility training;Community reintegration;DME/adaptive equipment instruction;Neuromuscular re-education;Therapeutic Exercise;UE/LE Strength taining/ROM;Splinting/orthotics;Pain management;Patient/family education;Stair training;UE/LE Coordination activities;Wheelchair propulsion/positioning PT Recommendation Follow Up Recommendations: Outpatient PT Equipment Recommended: Rolling walker with 5" wheels  PT Evaluation Precautions/Restrictions Precautions Precautions: Back;Fall Required Braces or Orthoses: Spinal Brace Spinal Brace: Applied in sitting position Restrictions Weight Bearing Restrictions: No   Pain Pain Assessment Pain Score:   4 Pain Type: Surgical pain Pain Location: Back Pain Descriptors: Aching Pain Intervention(s): RN made aware Home Living/Prior Functioning Home Living Lives With: Spouse Available Help at Discharge: Family;Available 24 hours/day Type of Home: House Home Access: Stairs to enter [employer] of Steps: 1 Entrance Stairs-Rails: Can reach both Home Layout: One level Prior Function Level of Independence: Independent with gait;Independent with transfers;Independent with basic ADLs  Cognition Overall Cognitive Status: Appears within functional limits for tasks assessed Sensation Sensation Light Touch: Appears Intact Proprioception: Appears Intact Coordination Gross Motor Movements are Fluid and Coordinated: Yes Motor  Motor Motor: Within Functional Limits  Mobility Bed Mobility Supine to Sit: 4: Min assist Supine to Sit Details (indicate cue type and reason): cues for log roll Transfers Sit to Stand: 4: Min assist Sit to Stand Details (indicate cue type and reason): lifting assist, cues for UE  placement Stand Pivot  Transfers: 4: Min assist Stand Pivot Transfer Details (indicate cue type and reason): steadying assist, assist for wt shift Locomotion  Ambulation Ambulation/Gait Assistance: 4: Min assist Ambulation Distance (Feet): 20 Feet Assistive device: 1 person hand held assist Ambulation/Gait Assistance Details: steadying assist, pt relies on UE for support and balance Stairs / Additional Locomotion Stairs: Yes Stairs Assistance: 4: Min assist Stair Management Technique: Two rails Number of Stairs: 5   Trunk/Postural Assessment  Cervical Assessment Cervical Assessment: Within Functional Limits Thoracic Assessment Thoracic Assessment: Within Functional Limits Lumbar Assessment Lumbar Assessment:  (spinal brace) Postural Control Postural Control: Within Functional Limits  Balance Static Standing Balance Static Standing - Level of Assistance: 4: Min assist Dynamic Standing Balance Dynamic Standing - Level of Assistance: 4: Min assist Extremity Assessment      RLE Assessment RLE Assessment:  (grossly 3/5 except 3-/5 quads) LLE Assessment LLE Assessment:  (grossly 3/5 except 3-/5 quads)  See FIM for current functional status Refer to Care Plan for Long Term Goals  Recommendations for other services: None  Discharge Criteria: Patient will be discharged from PT if patient refuses treatment 3 consecutive times without medical reason, if treatment goals not met, if there is a change in medical status, if patient makes no progress towards goals or if patient is discharged from hospital.  The above assessment, treatment plan, treatment alternatives and goals were discussed and mutually agreed upon: by patient  Treatment initiated during session: Gait training with RW 150' multiple attempts with supervision.  Curb training with RW to simulate home entry with close supervision, pt with good following of cues and instructions.  Kinetron for LE strength and endurance x 7 minutes without rest.   Pt with good activity tolerance.  DONAWERTH,KAREN 12/04/2011, 4:08 PM

## 2011-12-04 NOTE — Progress Notes (Signed)
Subjective/Complaints: Had a reasonable night. No new problems. Up in bathroom this am already  A 12 point review of systems has been performed and if not noted above is otherwise negative.   Objective: Vital Signs: Blood pressure 115/67, pulse 79, temperature 97.9 F (36.6 C), temperature source Oral, resp. rate 18, SpO2 98.00%. No results found.  Basename 12/04/11 0600 12/01/11 1056  WBC 5.5 6.4  HGB 10.1* 9.8*  HCT 29.4* 28.6*  PLT 205 181    Basename 12/04/11 0600 12/01/11 1056  NA 140 137  K 4.0 3.4*  CL 102 98  CO2 27 29  GLUCOSE 129* 160*  BUN 17 12  CREATININE 0.76 0.70  CALCIUM 9.9 10.3   CBG (last 3)  No results found for this basename: GLUCAP:3 in the last 72 hours  Wt Readings from Last 3 Encounters:  11/25/11 97.2 kg (214 lb 4.6 oz)  11/25/11 97.2 kg (214 lb 4.6 oz)  11/21/11 101.2 kg (223 lb 1.7 oz)    Physical Exam:  Nursing note and vitals reviewed.  Constitutional: He appears well-developed and well-nourished.  HENT:  Head: Normocephalic and atraumatic.  Eyes: Pupils are equal, round, and reactive to light.  Neck: Normal range of motion. Neck supple.  Cardiovascular: Normal rate and regular rhythm.  Pulmonary/Chest: Effort normal and breath sounds normal.  Abdominal: Soft. Bowel sounds are normal. He exhibits no distension. There is no tenderness.  Musculoskeletal: He exhibits no edema.  Back incision clean and dry with sutures in place. Strength 3-4/5 proximally in the legs. Pain inhibition weakness noted Neurological: He is alert.  Clearer today. Follows basic commands without difficulty. Insight and awareness a little limited.   Assessment/Plan: 1. 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 Physiatrist is providing close team supervision and 24 hour management of  active medical problems listed below. Physiatrist and rehab team continue to assess barriers to discharge/monitor patient progress toward functional and medical goals. FIM:       FIM - Toileting Toileting steps completed by patient: Adjust clothing prior to toileting;Performs perineal hygiene;Adjust clothing after toileting Toileting Assistive Devices: Grab bar or rail for support Toileting: 2: Max-Patient completed 1 of 3 steps  FIM - Diplomatic Services operational officer Devices: Walker;Elevated toilet seat;Bedside commode Toilet Transfers: 4-To toilet/BSC: Min A (steadying Pt. > 75%)  FIM - Bed/Chair Transfer Bed/Chair Transfer Assistive Devices: Therapist, occupational: 4: Sit > Supine: Min A (steadying pt. > 75%/lift 1 leg)  FIM - Locomotion: Ambulation Locomotion: Ambulation Assistive Devices: Walker - Standard Ambulation/Gait Assistance: 2: Max assist Locomotion: Ambulation: 1: Travels less than 50 ft with maximal assistance (Pt: 25 - 49%)  Comprehension Comprehension Mode: Auditory;Visual Comprehension: 5-Follows basic conversation/direction: With extra time/assistive device  Expression Expression Mode: Verbal Expression: 5-Expresses basic needs/ideas: With extra time/assistive device  Social Interaction Social Interaction: 5-Interacts appropriately 90% of the time - Needs monitoring or encouragement for participation or interaction.  Problem Solving Problem Solving: 5-Solves basic 90% of the time/requires cueing < 10% of the time  Memory Memory: 5-Recognizes or recalls 90% of the time/requires cueing < 10% of the time  Medical Problem List and Plan:  1. DVT Prophylaxis/Anticoagulation: Pharmaceutical: Lovenox  2. Pain Management: discontinued vicodin.   schedule tylenol and add Kpad. Use ultram prn  3. Mood: Continue cymbalta and Lamictal for now. [place] need to taper if mentation does not improve.  Pt showing signs of clearing. [name] LCSW to follow up for  evaluation.  4. Neuropsych: This patient is not capable of making decisions on his/her own behalf.  5. Neuropathy: continue chronic neurontin  6. Encephalopathy: due to delirium v/s medication effect v/s ETOH withdrawal. Seems to be showing improvement if you review chart over the last few days. disontinued  hydrocodone.  k Vit B 12, TSH levels pending . ua unremarkable, culture pending, bmet and cbc reasonable. .  7. CAD: On zocor at bedtime and metoprolol bid.  8. Urinary retention? On flomax 0.8mg . Will check PVRs, ua negative. Foley discontinued yesterday.   LOS (Days) 1 A FACE TO FACE EVALUATION WAS PERFORMED  SWARTZ,ZACHARY T 12/04/2011, 7:49 AM

## 2011-12-04 NOTE — Evaluation (Signed)
This note has been reviewed and this clinician agrees with information provided.  

## 2011-12-05 ENCOUNTER — Inpatient Hospital Stay (HOSPITAL_COMMUNITY): Payer: Medicare Other | Admitting: Physical Therapy

## 2011-12-05 ENCOUNTER — Inpatient Hospital Stay (HOSPITAL_COMMUNITY): Payer: Medicare Other | Admitting: Occupational Therapy

## 2011-12-05 ENCOUNTER — Encounter (HOSPITAL_COMMUNITY): Payer: Medicare Other | Admitting: Occupational Therapy

## 2011-12-05 MED ORDER — TAMSULOSIN HCL 0.4 MG PO CAPS: 0.4000 mg | ORAL_CAPSULE | Freq: Every day | ORAL | Status: DC

## 2011-12-05 MED ORDER — TAMSULOSIN HCL 0.4 MG PO CAPS
0.4000 mg | ORAL_CAPSULE | Freq: Every day | ORAL | Status: DC
  Filled 2011-12-05: qty 1

## 2011-12-05 NOTE — Progress Notes (Signed)
Patient left with wife staff pushing wheel chair

## 2011-12-05 NOTE — Progress Notes (Signed)
Physical Therapy Discharge Summary  Patient Details  Name: Calib Wadhwa Barnick MRN: [id number] Date of Birth: 1945/05/04  Today's Date: 12/05/2011  Patient has met 7 of 7 long term goals due to improved balance and ability to compensate for deficits.  Patient to discharge at an ambulatory level Modified Independent with RW.  Reasons goals not met: n/a  Recommendation:  Patient will benefit from ongoing skilled PT services in outpatient setting to continue to advance safe functional mobility, address ongoing impairments in back strength, balance, gait, and minimize fall risk.  Equipment: RW  Reasons for discharge: treatment goals met and discharge from hospital  Patient/family agrees with progress made and goals achieved: Yes   See FIM for current functional status  Atonya Templer 12/05/2011, 12:17 PM

## 2011-12-05 NOTE — Progress Notes (Signed)
[facility] Individual Statement of Services  Patient Name:  [name] [name]  Date:  12/05/2011  Welcome to the [facility].  Our goal is to provide you with an individualized program based on your diagnosis and situation, designed to meet your specific needs.  With this comprehensive rehabilitation program, you will be expected to participate in at least 3 hours of rehabilitation therapies Monday-Friday, with modified therapy programming on the weekends.  Your rehabilitation program will include the following services:  Physical Therapy (PT), Occupational Therapy (OT), 24 hour per day rehabilitation nursing, Case Management ( Social Worker), Rehabilitation Medicine, Nutrition Services and Pharmacy Services  Weekly team conferences will be held on Tuesdays to discuss your progress.  Your  Social Worker will talk with you frequently to get your input and to update you on team discussions.  Team conferences with you and your family in attendance [place] also be held.  Expected length of stay: 3-5 days  Overall anticipated outcome: modified independent  Depending on your progress and recovery, your program [place] change.  Your Social Worker will coordinate services and will keep you informed of any changes.  Your  Social Worker's name and contact numbers are listed  below.  The following services [place] also be recommended but are not provided by the [facility]:   Driving Evaluations  Home Health Rehabiltiation Services  Outpatient Rehabilitatation [facility]  Vocational Rehabilitation   Arrangements will be made to provide these services after discharge if needed.  Arrangements include referral to agencies that provide these services.  Your insurance has been verified to be:  Medicare and BCBS Your primary doctor is:  Dr. Dewaine Oats  Pertinent information will be shared with your doctor and your insurance company.  Social Worker:  [place], [place] [phone] or ([phone]  Information discussed with and copy given to patient by: Amada Jupiter, 12/05/2011, 9:32 AM

## 2011-12-05 NOTE — Progress Notes (Signed)
Social Work  Discharge Note  The overall goal for the admission was met for:   Discharge location: Yes - home with wife  Length of Stay: Yes - 2 days  Discharge activity level: Yes - modified independent  Home/community participation: Yes  Services provided included: MD, RD, PT, OT, RN, Pharmacy and SW  Financial Services: Medicare and Private Insurance: BCBS PPO  Follow-up services arranged: Outpatient: PT via [facility], DME: rolling walker, 3n1 commde via [facility] and Patient/Family has no preference for HH/DME agencies  Comments (or additional information):  Patient/Family verbalized understanding of follow-up arrangements: Yes  Individual responsible for coordination of the follow-up plan: patient  Confirmed correct DME delivered: Claudia Alvizo 12/05/2011    Jamese Trauger

## 2011-12-05 NOTE — Progress Notes (Signed)
Subjective/Complaints: Had a reasonable night. No new problems. Up in bathroom this am already  A 12 point review of systems has been performed and if not noted above is otherwise negative.   Objective: Vital Signs: Blood pressure 126/57, pulse 72, temperature 97.4 F (36.3 C), temperature source Oral, resp. rate 20, SpO2 97.00%. No results found.  Basename 12/04/11 0600  WBC 5.5  HGB 10.1*  HCT 29.4*  PLT 205    Basename 12/04/11 0600  NA 140  K 4.0  CL 102  CO2 27  GLUCOSE 129*  BUN 17  CREATININE 0.76  CALCIUM 9.9   CBG (last 3)  No results found for this basename: GLUCAP:3 in the last 72 hours  Wt Readings from Last 3 Encounters:  11/25/11 97.2 kg (214 lb 4.6 oz)  11/25/11 97.2 kg (214 lb 4.6 oz)  11/21/11 101.2 kg (223 lb 1.7 oz)    Physical Exam:  Nursing note and vitals reviewed.  Constitutional: He appears well-developed and well-nourished.  HENT:  Head: Normocephalic and atraumatic.  Eyes: Pupils are equal, round, and reactive to light.  Neck: Normal range of motion. Neck supple.  Cardiovascular: Normal rate and regular rhythm.  Pulmonary/Chest: Effort normal and breath sounds normal.  Abdominal: Soft. Bowel sounds are normal. He exhibits no distension. There is no tenderness.  Musculoskeletal: He exhibits no edema.  Back incision clean and dry with sutures in place. Strength 3-4/5 proximally in the legs. Pain inhibition weakness noted Neurological: He is alert.  Cognition normal.   Assessment/Plan: 1. 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 Physiatrist is providing close team supervision and 24 hour management of active medical problems listed below. Physiatrist and rehab team continue to assess barriers to discharge/monitor patient progress toward functional and medical  goals. FIM: FIM - Bathing Bathing Steps Patient Completed: Chest;Right Arm;Left Arm;Abdomen;Front perineal area;Buttocks;Right upper leg;Left upper leg Bathing: 4: Min-Patient completes 8-9 66f 10 parts or 75+ percent  FIM - Upper Body Dressing/Undressing Upper body dressing/undressing steps patient completed: Thread/unthread right sleeve of pullover shirt/dresss;Thread/unthread left sleeve of pullover shirt/dress;Put head through opening of pull over shirt/dress;Pull shirt over trunk Upper body dressing/undressing: 5: Set-up assist to: Apply TLSO, cervical collar FIM - Lower Body Dressing/Undressing Lower body dressing/undressing steps patient completed: Thread/unthread right underwear leg;Pull underwear up/down;Thread/unthread right pants leg;Pull pants up/down Lower body dressing/undressing: 3: Mod-Patient completed 50-74% of tasks  FIM - Toileting Toileting steps completed by patient: Adjust clothing prior to toileting;Performs perineal hygiene;Adjust clothing after toileting Toileting Assistive Devices: Grab bar or rail for support Toileting: 2: Max-Patient completed 1 of 3 steps  FIM - Diplomatic Services operational officer Devices: Walker;Elevated toilet seat;Bedside commode Toilet Transfers: 4-To toilet/BSC: Min A (steadying Pt. > 75%)  FIM - Bed/Chair Transfer Bed/Chair Transfer Assistive Devices: Therapist, occupational: 4: Sit > Supine: Min A (steadying pt. > 75%/lift 1 leg);4: Bed > Chair or W/C: Min A (steadying Pt. > 75%);4: Chair or W/C > Bed: Min A (steadying Pt. > 75%)  FIM - Locomotion: Wheelchair Locomotion: Wheelchair: 0: Activity did not occur FIM - Locomotion: Ambulation Locomotion: Ambulation Assistive Devices: Environmental consultant - Standard Ambulation/Gait Assistance: 4: Min assist Locomotion: Ambulation: 4: Travels 150 ft or more with minimal assistance (Pt.>75%)  Comprehension Comprehension Mode: Auditory;Visual Comprehension: 5-Follows basic  conversation/direction: With extra time/assistive device  Expression Expression Mode: Verbal Expression: 5-Expresses basic needs/ideas: With extra  time/assistive device  Social Interaction Social Interaction: 5-Interacts appropriately 90% of the time - Needs monitoring or encouragement for participation or interaction.  Problem Solving Problem Solving: 5-Solves basic 90% of the time/requires cueing < 10% of the time  Memory Memory: 5-Recognizes or recalls 90% of the time/requires cueing < 10% of the time  Medical Problem List and Plan:  1. DVT Prophylaxis/Anticoagulation: Pharmaceutical: Lovenox  2. Pain Management: discontinued vicodin.   schedule tylenol and add Kpad. Use ultram prn  3. Mood: Continue cymbalta and Lamictal for now. [place] need to taper if mentation does not improve. Pt showing signs of clearing. [name] LCSW to follow up for evaluation.  4. Neuropsych: This patient is not capable of making decisions on his/her own behalf.  5. Neuropathy: continue chronic neurontin  6. Encephalopathy: this has resolved. Likely med effect. Labs wnl 7. CAD: On zocor at bedtime and metoprolol bid.  8. Urinary retention? On flomax 0.8mg .    LOS (Days) 2 A FACE TO FACE EVALUATION WAS PERFORMED  Amaurie Wandel T 12/05/2011, 7:56 AM

## 2011-12-05 NOTE — Progress Notes (Signed)
Physical Therapy Note  Patient Details  Name: [name] [name] MRN: [id number] Date of Birth: 12/10/1945 Today's Date: 12/05/2011  Time 1 : 800-855 55 minutes  1:1 No c/o pain.  Pt/family education with pt's wife.  Pt's wife expresses understanding of recommendations for RW use at all times in the house, importance of donning brace when OOB, PT recommendation for outpatient PT follow up.  Pt to d/c home at mod I level.  Car transfer to simulated sedan and SUV height with mod I, stair training 1 flight with B handrails mod I.  Review of HEP with handout, pt able to perform 15x each exercise B LEs.  Dynamic gait training with RW with head turns, speed changes, stop/start without LOB.  Pt reports he feels safe to d/c home today.  Time 2: 1100-1125 25 minutes  1:1 No c/o pain.  Treatment focused on gait training with new RW on a variety of surfaces, up/down inclines, speed and direction changes.  Pt able to perform >2000' at mod I level with no LOB.  Continues with slow cadence but good activity tolerance.   Sheala Dosh 12/05/2011, 8:56 AM

## 2011-12-05 NOTE — Progress Notes (Signed)
Occupational Therapy daily Notes and Discharge Summary  Patient Details  Name: [name] [name] MRN: [id number] Date of Birth: 08/17/1945  Today's Date: 12/05/2011 Time: 0930-1030 Time Calculation (min): 60 min  GRAD DAY: self care retraining at shower level down in ADL apartment using the tub bench, focus on donning and doffing lumbar corset, recall of method to transfer into shower first and then remove or don brace, use of reacher and sock aide for increased independence with LB dressing. Pt plans to purchase AE items with wife at d/c. Handouts given on d/c instructions for ADLs post back sx and for safe mobility with back precautions, and AE items recommended.  2nd session: 13:00-13:45 ( ) 1:1 focus on activity tolerance with functional ambulation with RW, challenging balance with ambulation without RW with min A (with slower pace), kitchen management tasks with RW and walker bag, review of back precautions, and practice 2x donning and doffing lumbar corset. Pt still requires min cuing for donning.   Patient has met 8 of 8 long term goals due to improved activity tolerance, improved balance, postural control and improved ability to maintain back precautions with basic ADLs.  Patient to discharge at overall mod I with supervision to don lumbar corset. Pt can use reacher, long handled sponge and sock aide to complete LB bathing and dressing.  Patient's care partner is independent to provide the necessary physical assistance at discharge to don brace.    Reasons goals not met: n/a  Recommendation:  Patient will benefit from ongoing skilled OT services in home health setting to continue to advance functional skills in the area of BADL.  Equipment: No equipment provided; pt has access to tub bench at home.  Reasons for discharge: treatment goals met and discharge from hospital  Patient/family agrees with progress made and goals achieved: Yes  OT Discharge Precautions/Restrictions    Precautions Precautions: Back Required Braces or Orthoses: Spinal Brace Spinal Brace: Lumbar corset;Applied in sitting position Restrictions Weight Bearing Restrictions: No Pain Pain Assessment Pain Assessment: No/denies pain Pain Score:   4 Pain Type: Acute pain Pain Location: Back Pain Intervention(s): Medication (See eMAR) ADL  see FIM Vision/Perception  Vision - History Baseline Vision: Wears glasses only for reading Patient Visual Report: No change from baseline Vision - Assessment Eye Alignment: Within Functional Limits Perception Perception: Within Functional Limits Praxis Praxis: Intact  Cognition Overall Cognitive Status: Appears within functional limits for tasks assessed Arousal/Alertness: Awake/alert Orientation Level: Oriented X4 Memory: Impaired Memory Impairment: Decreased recall of new information Awareness: Appears intact Problem Solving: Appears intact Safety/Judgment: Appears intact Sensation Sensation Light Touch: Appears Intact Stereognosis: Appears Intact Hot/Cold: Appears Intact Proprioception: Appears Intact Coordination Gross Motor Movements are Fluid and Coordinated: Yes Fine Motor Movements are Fluid and Coordinated: Yes Motor   wFL Mobility  Transfers Sit to Stand: 6: Modified independent (Device/Increase time) Stand to Sit: 6: Modified independent (Device/Increase time)  Trunk/Postural Assessment  Cervical Assessment Cervical Assessment: Within Functional Limits Thoracic Assessment Thoracic Assessment: Within Functional Limits Lumbar Assessment Lumbar Assessment: Within Functional Limits (spinal brace) Postural Control Postural Control: Within Functional Limits  Balance Dynamic Standing Balance Dynamic Standing - Balance Support: During functional activity Dynamic Standing - Level of Assistance: 6: Modified independent (Device/Increase time) Extremity/Trunk Assessment RUE Assessment RUE Assessment: Within Functional  Limits LUE Assessment LUE Assessment: Within Functional Limits  See FIM for current functional status  Roney Mans [facility] 12/05/2011, 1:52 PM

## 2011-12-05 NOTE — Progress Notes (Signed)
Patient information reviewed and entered into eRehab system by Tamari Busic, RN, CRRN, PPS Coordinator.  Information including medical coding and functional independence measure will be reviewed and updated through discharge.     Per nursing patient was given "Data Collection Information Summary for Patients in Inpatient Rehabilitation Facilities with attached "Privacy Act Statement-Health Care Records" upon admission.  

## 2011-12-05 NOTE — Progress Notes (Signed)
Social Work  Social Work Assessment and Plan  Patient Details  Name: [name] [name] MRN: [id number] Date of Birth: Dec 19, 1945  Today's Date: 12/05/2011  Problem List:  Patient Active Problem List  Diagnosis  . Lumbar stenosis   Past Medical History:  Past Medical History  Diagnosis Date  . Arthritis   . Depression     STARTED AFTER  OPEN HEART SURG.  . Stroke     2008  RIGHT SIDE...HAD FOR A COUPLE OF DAYS AND THEN WENT AWAY  . Stroke     WENT TO [place]  2008  . Neuropathy     FIBRO        SEES  DR. Corrinne Eagle  . Coronary artery disease   . Hypertension    Past Surgical History:  Past Surgical History  Procedure Date  . Coronary artery bypass graft     (2010 @ [facility]  . Hand surgery     CRUSHING INJURY---1966  HAS ABOUT 6 SURGERIES THEN  . Hernia repair     BILATERAL  . Eye surgery     ?? BLEPHOPLASTY  . Lumbar fusion 11/25/2011    POSTERIOR   Social History:  reports that he quit smoking about 5 years ago. He started smoking about 43 years ago. He quit smokeless tobacco use about 5 years ago. He reports that he drinks about 7.2 ounces of alcohol per week. He reports that he does not use illicit drugs.  Family / Support Systems Marital Status: Married Patient Roles: Spouse;Parent Spouse/Significant Other: wife, [name] [name] @ (H) [phone] or ([phone] Children: They have an adult son and daughter with both living within 15 mins drive of pt. Anticipated Caregiver: wife (Has a Dtr and a son who work.) Ability/Limitations of Caregiver: Wife retired early and can assist. Caregiver Availability: 24/7 Family Dynamics: Wife very attentive and willing to offer any assistance needed.  Social History Preferred language: English Religion: [employer] Cultural Background: NA Education: HS Read: Yes Write: Yes Employment Status: Retired Date Retired/Disabled/Unemployed: approx 2 years Fish farm manager Issues: none Guardian/Conservator: none    Abuse/Neglect Physical Abuse: Denies Verbal Abuse: Denies Sexual Abuse: Denies Exploitation of patient/patient's resources: Denies Self-Neglect: Denies  Emotional Status Pt's affect, behavior adn adjustment status: Pt very pleasant, oriented and eager to proceed with therapies and "get home".  Denies any significant emotional distress,  however, will monitor through CIR stay. Recent Psychosocial Issues: None Pyschiatric History: None Substance Abuse History: None  Patient / Family Perceptions, Expectations & Goals Pt/Family understanding of illness & functional limitations: Pt and wife able to provide detailed report of medical issues, procedures performed and of current functional limitations. Premorbid pt/family roles/activities: Pt and wife were independent at home and both retired.  Pt's overall mobility and decreased due to lumbar stenosis with wife having to assume more of the household management duties. Anticipated changes in roles/activities/participation: Little change anticipated from what they were doing PTA Pt/family expectations/goals: "I want to be able to get around better and get home"  [employer]: None Premorbid Home Care/DME Agencies: None Transportation available at discharge: yes  Discharge Planning Living Arrangements: Spouse/significant other Support Systems: Spouse/significant other;Children Type of Residence: Private residence Insurance Resources: Administrator (specify) Herbalist) Financial Resources: Restaurant manager, fast food Screen Referred: No Living Expenses: Own Money Management: Spouse Do you have any problems obtaining your medications?: No Home Management: wife Patient/Family Preliminary Plans: Pt plans to return home with wife who is able to provide 24/7 assistance  Social Work Anticipated Follow Up Needs: HH/OP Expected length of stay: 3 days  Clinical Impression Pt very pleasant and oriented.  Wife  very supportive.  Pt moving well and team feels will only need very short LOS.  No concerns per pt and wife.  [name] [name] 12/05/2011, 9:29 AM

## 2011-12-06 LAB — URINE CULTURE

## 2011-12-08 ENCOUNTER — Encounter: Payer: Self-pay | Admitting: Physical Medicine & Rehabilitation

## 2011-12-10 NOTE — Progress Notes (Signed)
Discharge summary [phone]

## 2011-12-11 NOTE — Discharge Summary (Signed)
NAMEARSHAWN, [name]                   ACCOUNT NO.:  [id number]  MEDICAL RECORD NO.:  [id number]  LOCATION:  4008                         FACILITY:  [facility]  PHYSICIAN:  Ranelle Oyster, M.D.DATE OF BIRTH:  [birth year]  DATE OF ADMISSION:  12/03/2011 DATE OF DISCHARGE:  12/05/2011                              DISCHARGE SUMMARY   DISCHARGE DIAGNOSES: 1. Lumbar spinal stenosis with radiculopathy. 2. Postop delirium, resolved. 3. 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 4. Coronary artery disease. 5. Neuropathy.  HISTORY OF PRESENT ILLNESS:  [name] is a 66 year old male with history of coronary artery disease, CVA, progressive low back pain with radiculopathy bilateral lower extremities.  Workup revealed severe stenosis L4-L5 with facet arthropathy and the patient elected to undergo L3-L4 lam with decompression and fusion of L4-5 by Dr. Jeral Fruit on November 26, 2011.  Postop has had problems with sedation and lethargy as well as confusion.  Narcotics are being tapered but patient continues to have poor safety awareness requiring assistance for all mobility.  Therapy team recommended CIR for progression.  PAST MEDICAL HISTORY: 1. Arthritis. 2. Depression. 3. Stroke. 4. Neuropathy. 5. Coronary artery disease. 6. Hypertension.  FUNCTIONAL HISTORY:  The patient was independent, but sedentary for the last few months due to pain.  FUNCTIONAL STATUS:  The patient is requiring +2 max assist for mobility.  RECENT LABORATORY DATA:  Check of lytes revealed sodium 140, potassium 4.0, chloride 102, CO2 27, BUN 17, creatinine 0.76, glucose 129.  Check of CBC revealed hemoglobin 10.1, hematocrit 29.4, white count 5.5, platelets 205.  UA ordered and was negative.  HOSPITAL COURSE:  [name] was admitted to Rehab on December 03, 2011, for inpatient therapies to consist of PT, OT at least 3 hours 5 days a week.  Past admission, physiatrist, rehab RN, and therapy team have  worked together to provide customized, collaborative interdisciplinary care.  Rehab RN has worked with the patient on bowel and bladder program as well as wound care monitoring.  The patient's Vicodin was discontinued and Tylenol was scheduled for pain management. His mentation was monitored and had improved greatly by the time of discharge.  The pain was reasonable with scheduled Tylenol. Check of CBC revealed no leukocytosis.  His acute blood loss anemia was stable.  TSH levels checked were normal at 2.353, and B12 level was normal at 600.  The patient's back incision was healing well without any signs or symptoms of infection.  He made good progress during his stay. He was at modified independent level for bathing and dressing task.  He required supervision to don lumbar corset.  The patient was ambulating at modified independent level with use of rolling walker.  Outpatient therapies were recommended for followup past discharge.  On December 05, 2011, the patient was discharged to home.  DISCHARGE MEDICATIONS: 1. Tylenol 650 mg p.o. q.i.d. 2. MiraLax 17 g in 8 ounces p.o. per day. 3. Flomax 0.4 mg p.o. per day x1 week, then discontinue. 4. Tramadol 50 mg p.o. q.6 hours p.r.n. severe pain. 5. Cymbalta 30 mg 2 after breakfast and 1 after lunch. 6. Lamictal 25 mg p.o. b.i.d.  7. Toprol-XL 25 mg p.o. b.i.d. 8. Zocor 40 mg p.o. at bedtime. 9. Kenalog cream to affected areas p.r.n. 10.Trilipix 135 mg p.o. at bedtime. 11.Vitamin D 1000 units p.o. per day. 12.Lubricating eye drops p.r.n.  DIET:  Regular.  ACTIVITY LEVEL:  Supervision for until cognition fully healed.  SPECIAL INSTRUCTIONS:  No lifting, driving,no strenuous exercise for 4-6 weeks, to be cleared by Dr. Jeral Fruit.  Outpatient physical therapy at [facility] [place] starting December 08, 2011, at 3:30 p.m. Keep wound clean and dry.  Okay to shower.  Wear brace when out of bed.  FOLLOWUP:  The patient to follow up with  Dr. Hilda Lias for postop check in 2 weeks.  Follow up with Dr. Dewaine Oats on December 23, 2011, at 3:15 p.m.  Follow up with Dr. Riley Kill as needed.     Delle Reining, P.A.   ______________________________ Ranelle Oyster, M.D.    PL/MEDQ  D:  12/10/2011  T:  12/11/2011  Job:  [id number]  cc:   Hilda Lias, M.D. Dewaine Oats

## 2011-12-21 ENCOUNTER — Encounter: Payer: Self-pay | Admitting: Physical Medicine & Rehabilitation

## 2011-12-23 NOTE — Discharge Summary (Signed)
Physician Discharge Summary  Patient ID: [name] [name] MRN: [id number] DOB/AGE: March 15, 1945 66 y.o.  Admit date: 11/25/2011 Discharge date: 12/23/2011  Admission Diagnoses:ddd lumbar spine  Discharge Diagnoses: same Active Problems:  * No active hospital problems. *    Discharged Condition: confused but stable  Hospital Course: lumbar fusion  Consults: rehabilitation medicine  Significant Diagnostic Studies:lumbar myelogram  Treatments: surgery  Discharge Exam: Blood pressure 137/59, pulse 79, temperature 98.3 F (36.8 C), temperature source Oral, resp. rate 18, height 5\' 11"  (1.803 m), weight 97.2 kg (214 lb 4.6 oz), SpO2 98.00%. Clinically stable with no weakness but some confusion  Disposition: transferred by dr Franky Macho to the rehabilitation unit     Medication List     As of 12/23/2011  5:06 PM    STOP taking these medications         HYDROcodone-acetaminophen 5-500 MG per tablet   Commonly known as: VICODIN      predniSONE 10 MG tablet   Commonly known as: DELTASONE      TAKE these medications         DULoxetine 30 MG capsule   Commonly known as: CYMBALTA   Take 30-60 mg by mouth 2 (two) times daily. 2 after breakfast   1 after lunch      lamoTRIgine 25 MG tablet   Commonly known as: LAMICTAL   Take 50 mg by mouth 2 (two) times daily. On 10/9- 1 tablet twice daily  On 10/23-2 tablets twice daily  On 11/6-3 tablets three times daily      metoprolol succinate 25 MG 24 hr tablet   Commonly known as: TOPROL-XL   Take 25 mg by mouth 2 (two) times daily.      OVER THE COUNTER MEDICATION   Place 1-2 drops into both eyes daily. Multi Action Relief Lubricant Eye Drop OTC      simvastatin 40 MG tablet   Commonly known as: ZOCOR   Take 40 mg by mouth at bedtime.      triamcinolone cream 0.1 %   Commonly known as: KENALOG   Apply 1 application topically 3 (three) times daily as needed. To affected area chest.      TRILIPIX 135 MG capsule   Generic drug:  Choline Fenofibrate   Take 135 mg by mouth at bedtime.      Vitamin D-3 1000 UNITS Caps   Take 1 capsule by mouth daily.         Signed: Karn Cassis 12/23/2011, 5:06 PM

## 2012-01-21 ENCOUNTER — Encounter: Payer: Self-pay | Admitting: Physical Medicine & Rehabilitation

## 2012-03-09 ENCOUNTER — Encounter: Payer: Self-pay | Admitting: Neurology

## 2012-03-09 DIAGNOSIS — G629 Polyneuropathy, unspecified: Secondary | ICD-10-CM | POA: Insufficient documentation

## 2012-03-09 DIAGNOSIS — G609 Hereditary and idiopathic neuropathy, unspecified: Secondary | ICD-10-CM

## 2012-04-22 ENCOUNTER — Ambulatory Visit (INDEPENDENT_AMBULATORY_CARE_PROVIDER_SITE_OTHER): Payer: Medicare Other | Admitting: Neurology

## 2012-04-22 ENCOUNTER — Encounter: Payer: Self-pay | Admitting: Neurology

## 2012-04-22 VITALS — BP 151/81 | HR 95 | Ht 70.5 in | Wt 230.0 lb

## 2012-04-22 DIAGNOSIS — G609 Hereditary and idiopathic neuropathy, unspecified: Secondary | ICD-10-CM

## 2012-04-22 DIAGNOSIS — M48061 Spinal stenosis, lumbar region without neurogenic claudication: Secondary | ICD-10-CM

## 2012-04-22 NOTE — Progress Notes (Signed)
Reason for visit: Peripheral neuropathy  [name] [name] is an 66 y.o. male  History of present illness:  [name] [name] is a 67 year old right-handed white male with a history of a peripheral neuropathy. The patient also had problems with lumbosacral spine disease, and he has recently had lumbosacral spinal surgery. The patient had a spinal fluid leak following the surgery, but he was able to recover without recurrent surgery. The patient is on Cymbalta taking 60 mg in the morning, and 30 mg in the evening. The patient was given a trial on Lamictal, but this was not helpful. The patient overall feels that the lumbosacral spine surgery has helped his leg pain. The patient still has some burning in the feet at night. The patient recently had an attack of gout.  Past Medical History  Diagnosis Date  . Arthritis   . Depression     STARTED AFTER  OPEN HEART SURG.  . Neuropathy     FIBRO        SEES  DR. Corrinne Eagle  . Coronary artery disease   . Hypertension   . Lumbosacral spinal stenosis   . Foot pain, bilateral   . Diabetes mellitus without complication     Borderline  . Gout   . Stroke     2008  RIGHT SIDE...HAD FOR A COUPLE OF DAYS AND THEN WENT AWAY  . Stroke     WENT TO [place]  2008    Past Surgical History  Procedure Laterality Date  . Coronary artery bypass graft      (2010 @ [facility]  . Hand surgery      CRUSHING INJURY---1966  HAS ABOUT 6 SURGERIES THEN  . Hernia repair      BILATERAL  . Eye surgery      ?? BLEPHOPLASTY  . Lumbar fusion  11/25/2011    POSTERIOR  . Hand surgery Right     Family History  Problem Relation Age of Onset  . Diabetes Mother     Social history:  reports that he quit smoking about 6 years ago. He started smoking about 44 years ago. He quit smokeless tobacco use about 6 years ago. He reports that he drinks about 7.2 ounces of alcohol per week. He reports that he does not use illicit drugs.  Allergies: No Known Allergies  Medications:   Current Outpatient Prescriptions on File Prior to Visit  Medication Sig Dispense Refill  . aspirin 81 MG tablet Take 81 mg by mouth daily.      . Cholecalciferol (VITAMIN D-3) 1000 UNITS CAPS Take 1 capsule by mouth daily.      . Choline Fenofibrate (TRILIPIX) 135 MG capsule Take 135 mg by mouth at bedtime.      . clopidogrel (PLAVIX) 75 MG tablet Take 75 mg by mouth daily.      . DULoxetine (CYMBALTA) 30 MG capsule Take 30-60 mg by mouth 2 (two) times daily. 2 after breakfast  1 after lunch      . metoprolol succinate (TOPROL-XL) 25 MG 24 hr tablet Take 25 mg by mouth 2 (two) times daily.       . nitroGLYCERIN (NITROSTAT) 0.4 MG SL tablet Place 0.4 mg under the tongue every 5 (five) minutes as needed for chest pain.      [name] OVER THE COUNTER MEDICATION Place 1-2 drops into both eyes daily. Multi Action Relief Lubricant Eye Drop OTC      . simvastatin (ZOCOR) 40 MG tablet Take 40 mg  by mouth at bedtime.      . triamcinolone cream (KENALOG) 0.1 % Apply 1 application topically 3 (three) times daily as needed. To affected area chest.      . acetaminophen (TYLENOL) 325 MG tablet Take 2 tablets (650 mg total) by mouth 4 (four) times daily -  with meals and at bedtime.       No current facility-administered medications on file prior to visit.    ROS:  Out of a complete 14 system review of symptoms, the patient complains only of the following symptoms, and all other reviewed systems are negative.  Weight gain Increased thirst Joint pain, gout Memory loss Depression Insomnia  Blood pressure 151/81, pulse 95, height 5' 10.5" (1.791 m), weight 230 lb (104.327 kg).  Physical Exam  General: The patient is alert and cooperative at the time of the examination. The patient is moderately obese.  Skin: No significant peripheral edema is noted.   Neurologic Exam  Cranial nerves: Facial symmetry is present. Speech is normal, no aphasia or dysarthria is noted. Extraocular movements are full.  Visual fields are full.  Motor: The patient has good strength in all 4 extremities.  Coordination: The patient has good finger-nose-finger and heel-to-shin bilaterally.  Gait and station: The patient has a normal gait. Tandem gait is normal. Romberg is negative. No drift is seen.  Reflexes: Deep tendon reflexes are symmetric, but are depressed.   Assessment/Plan:  One. Peripheral neuropathy  2. Lumbosacral spinal stenosis  The patient believes that his overall pain level has improved following the lumbosacral spine surgery. The patient feels as if his pain level is currently tolerable. We will not readjust medications at this time. The patient has a prescription for hydrocodone to take if needed. The patient otherwise will followup through this office in 6-8 months.  Marlan [name] MD 04/22/2012 7:40 PM  [facility] [address] [place], [place] [address]  Phone [phone] Fax [phone]

## 2012-04-22 NOTE — Patient Instructions (Signed)
   Continue the Cymbalta and the hydrocodone if needed.

## 2012-08-25 ENCOUNTER — Other Ambulatory Visit: Payer: Self-pay | Admitting: Neurology

## 2012-08-26 NOTE — Telephone Encounter (Signed)
Rx signed and faxed.

## 2012-10-26 ENCOUNTER — Ambulatory Visit: Payer: Medicare Other | Admitting: Neurology

## 2012-10-27 ENCOUNTER — Encounter: Payer: Self-pay | Admitting: Neurology

## 2012-10-27 ENCOUNTER — Ambulatory Visit (INDEPENDENT_AMBULATORY_CARE_PROVIDER_SITE_OTHER): Payer: Medicare Other | Admitting: Neurology

## 2012-10-27 VITALS — BP 155/80 | HR 87 | Wt 235.0 lb

## 2012-10-27 DIAGNOSIS — G609 Hereditary and idiopathic neuropathy, unspecified: Secondary | ICD-10-CM

## 2012-10-27 DIAGNOSIS — G4700 Insomnia, unspecified: Secondary | ICD-10-CM

## 2012-10-27 DIAGNOSIS — M48061 Spinal stenosis, lumbar region without neurogenic claudication: Secondary | ICD-10-CM

## 2012-10-27 HISTORY — DX: Insomnia, unspecified: G47.00

## 2012-10-27 MED ORDER — HYDROCODONE-ACETAMINOPHEN 5-325 MG PO TABS: 1.0000 | ORAL_TABLET | Freq: Four times a day (QID) | ORAL | Status: DC | PRN

## 2012-10-27 MED ORDER — TRAZODONE HCL 100 MG PO TABS: 100.0000 mg | ORAL_TABLET | Freq: Every day | ORAL | Status: DC

## 2012-10-27 NOTE — Progress Notes (Signed)
Reason for visit: Peripheral neuropathy  [name] [name] is an 67 y.o. male  History of present illness:  [name] [name] is a 67 year old right-handed white male with a history of a peripheral neuropathy and a history of lumbosacral spinal stenosis. The patient has undergone lumbosacral spine surgery, and he has gained significant benefit with his back pain and leg discomfort. The patient still has some issues with the peripheral neuropathy pain, and he has some problems with restless leg syndrome. The patient has chronic insomnia, but he indicates that it is not secondary to the neuropathy problems. The patient does have some intermittent low back pain associated with physical activity. The patient denies any significant issues with balance, and he denies any falls. The patient will take Cymbalta, currently on 90 mg daily, and he takes hydrocodone if needed. Overall, he is generally doing fairly well.  Past Medical History  Diagnosis Date  . Arthritis   . Depression     STARTED AFTER  OPEN HEART SURG.  . Neuropathy     FIBRO        SEES  DR. Corrinne [name]  . Coronary artery disease   . Hypertension   . Lumbosacral spinal stenosis   . Foot pain, bilateral   . Diabetes mellitus without complication     Borderline  . Gout   . Stroke     2008  RIGHT SIDE...HAD FOR A COUPLE OF DAYS AND THEN WENT AWAY  . Stroke     WENT TO [place]  2008  . Insomnia 10/27/2012  . Restless leg syndrome     Past Surgical History  Procedure Laterality Date  . Coronary artery bypass graft      (2010 @ [facility]  . Hand surgery      CRUSHING INJURY---1966  HAS ABOUT 6 SURGERIES THEN  . Hernia repair      BILATERAL  . Eye surgery      ?? BLEPHOPLASTY  . Lumbar fusion  11/25/2011    POSTERIOR  . Hand surgery Right     Family History  Problem Relation Age of Onset  . Diabetes Mother     Social history:  reports that he quit smoking about 6 years ago. He started smoking about 44 years ago. He quit smokeless  tobacco use about 6 years ago. He reports that he drinks about 7.2 ounces of alcohol per week. He reports that he does not use illicit drugs.   No Known Allergies  Medications:  Current Outpatient Prescriptions on File Prior to Visit  Medication Sig Dispense Refill  . aspirin 81 MG tablet Take 81 mg by mouth daily.      . Cholecalciferol (VITAMIN D-3) 1000 UNITS CAPS Take 1 capsule by mouth daily.      . Choline Fenofibrate (TRILIPIX) 135 MG capsule Take 135 mg by mouth at bedtime.      . clopidogrel (PLAVIX) 75 MG tablet Take 75 mg by mouth daily.      . DULoxetine (CYMBALTA) 30 MG capsule Take 30-60 mg by mouth 2 (two) times daily. 2 after breakfast  1 after lunch      . metoprolol succinate (TOPROL-XL) 25 MG 24 hr tablet Take 25 mg by mouth 2 (two) times daily.       . nitroGLYCERIN (NITROSTAT) 0.4 MG SL tablet Place 0.4 mg under the tongue every 5 (five) minutes as needed for chest pain.      [name] OVER THE COUNTER MEDICATION Place 1-2 drops into  both eyes daily. Multi Action Relief Lubricant Eye Drop OTC      . simvastatin (ZOCOR) 40 MG tablet Take 40 mg by mouth at bedtime.       No current facility-administered medications on file prior to visit.    ROS:  Out of a complete 14 system review of symptoms, the patient complains only of the following symptoms, and all other reviewed systems are negative.  Fatigue Eye pain Easy bruising, easy bleeding Increased thirst Joint pain, joint swelling, muscle cramps Depression, insomnia, decreased energy, restless legs  Blood pressure 155/80, pulse 87, weight 235 lb (106.595 kg).  Physical Exam  General: The patient is alert and cooperative at the time of the examination.  Skin: No significant peripheral edema is noted.   Neurologic Exam  Cranial nerves: Facial symmetry is present. Speech is normal, no aphasia or dysarthria is noted. Extraocular movements are full. Visual fields are full.  Motor: The patient has good strength in  all 4 extremities.  Coordination: The patient has good finger-nose-finger and heel-to-shin bilaterally.  Gait and station: The patient has a normal gait. Tandem gait is normal. Romberg is negative. No drift is seen.  Reflexes: Deep tendon reflexes are symmetric, depressed at the ankles.   Assessment/Plan:  One. Peripheral neuropathy  2. Lumbosacral spinal stenosis, status post surgery  3. Restless leg syndrome  4. Chronic insomnia  The patient indicates that his major issue at this point is chronic insomnia. The patient will be placed on trazodone at night for sleep. The patient was given a prescription for the hydrocodone today. In the future, the patient will see if his primary care physician will give him this prescription, as to save him travel time picking up prescriptions through this office. The patient will otherwise follow up in 6 months. The patient will contact our office if an adjustment of the trazodone dose needs to be made.  [name] [name] MD 10/27/2012 7:34 PM  [facility] [address] [place], [place] [address]  Phone [phone] Fax [phone]

## 2012-10-27 NOTE — Patient Instructions (Signed)
Call if the trazodone dose needs to be readjusted.

## 2013-04-15 DIAGNOSIS — I509 Heart failure, unspecified: Secondary | ICD-10-CM | POA: Insufficient documentation

## 2013-04-15 DIAGNOSIS — I10 Essential (primary) hypertension: Secondary | ICD-10-CM | POA: Insufficient documentation

## 2013-04-15 DIAGNOSIS — I25810 Atherosclerosis of coronary artery bypass graft(s) without angina pectoris: Secondary | ICD-10-CM | POA: Insufficient documentation

## 2013-04-27 ENCOUNTER — Ambulatory Visit: Payer: Medicare Other | Admitting: Nurse Practitioner

## 2015-11-15 ENCOUNTER — Other Ambulatory Visit: Payer: Self-pay | Admitting: Neurosurgery

## 2015-11-15 DIAGNOSIS — M5416 Radiculopathy, lumbar region: Secondary | ICD-10-CM

## 2015-11-15 DIAGNOSIS — M5412 Radiculopathy, cervical region: Secondary | ICD-10-CM

## 2015-11-26 ENCOUNTER — Ambulatory Visit
Admission: RE | Admit: 2015-11-26 | Discharge: 2015-11-26 | Disposition: A | Discharge status: Home / Self Care | Payer: Medicare HMO | Source: Ambulatory Visit | Attending: Neurosurgery | Admitting: Neurosurgery

## 2015-11-26 ENCOUNTER — Other Ambulatory Visit: Payer: Medicare Other

## 2015-11-26 DIAGNOSIS — M5412 Radiculopathy, cervical region: Secondary | ICD-10-CM

## 2015-11-26 DIAGNOSIS — M5416 Radiculopathy, lumbar region: Secondary | ICD-10-CM

## 2015-11-26 MED ORDER — ONDANSETRON HCL 4 MG/2ML IJ SOLN: 4.0000 mg | Freq: Once | INTRAMUSCULAR | Status: DC

## 2015-11-26 MED ORDER — IOPAMIDOL (ISOVUE-M 300) INJECTION 61%
10.0000 mL | Freq: Once | INTRAMUSCULAR | Status: AC | PRN
  Administered 2015-11-26: 10 mL via INTRATHECAL

## 2015-11-26 MED ORDER — ONDANSETRON HCL 4 MG/2ML IJ SOLN: 4.0000 mg | Freq: Four times a day (QID) | INTRAMUSCULAR | Status: DC | PRN

## 2015-11-26 MED ORDER — MEPERIDINE HCL 100 MG/ML IJ SOLN
100.0000 mg | Freq: Once | INTRAMUSCULAR | Status: AC
  Administered 2015-11-26: 100 mg via INTRAMUSCULAR

## 2015-11-26 MED ORDER — ONDANSETRON HCL 4 MG/2ML IJ SOLN
4.0000 mg | Freq: Once | INTRAMUSCULAR | Status: AC
Start: 2015-11-26 — End: 2015-11-26
  Administered 2015-11-26: 4 mg via INTRAMUSCULAR

## 2015-11-26 MED ORDER — DIAZEPAM 5 MG PO TABS
5.0000 mg | ORAL_TABLET | Freq: Once | ORAL | Status: AC
  Administered 2015-11-26: 5 mg via ORAL

## 2015-11-26 NOTE — Discharge Instructions (Signed)
Myelogram Discharge Instructions  1. Go home and rest quietly for the next 24 hours.  It is important to lie flat for the next 24 hours.  Get up only to go to the restroom.  You Scrivens lie in the bed or on a couch on your back, your stomach, your left side or your right side.  You Lippe have one pillow under your head.  You Yin have pillows between your knees while you are on your side or under your knees while you are on your back.  2. DO NOT drive today.  Recline the seat as far back as it will go, while still wearing your seat belt, on the way home.  3. You Bayon get up to go to the bathroom as needed.  You Roscher sit up for 10 minutes to eat.  You Brennen resume your normal diet and medications unless otherwise indicated.  Drink lots of extra fluids today and tomorrow.  4. The incidence of headache, nausea, or vomiting is about 5% (one in 20 patients).  If you develop a headache, lie flat and drink plenty of fluids until the headache goes away.  Caffeinated beverages Lean be helpful.  If you develop severe nausea and vomiting or a headache that does not go away with flat bed rest, call [phone].  5. You Stribling resume normal activities after your 24 hours of bed rest is over; however, do not exert yourself strongly or do any heavy lifting tomorrow. If when you get up you have a headache when standing, go back to bed and force fluids for another 24 hours.  6. Call your physician for a follow-up appointment.  The results of your myelogram will be sent directly to your physician by the following day.  7. If you have any questions or if complications develop after you arrive home, please call [phone].  Discharge instructions have been explained to the patient.  The patient, or the person responsible for the patient, fully understands these instructions.         [name] [name] resume Cymbalta on Nov. 7, 2017, after 9:30 am.

## 2015-11-26 NOTE — Progress Notes (Signed)
Patient states he has been off Cymbalta for at least the past two days.  jkl

## 2015-11-28 ENCOUNTER — Telehealth: Payer: Self-pay

## 2015-11-28 NOTE — Telephone Encounter (Signed)
Spoke with patient after he had a myelogram here 11/6115.  He states he is doing fine and denies any headache.  jkl

## 2016-01-08 ENCOUNTER — Ambulatory Visit (INDEPENDENT_AMBULATORY_CARE_PROVIDER_SITE_OTHER): Payer: Medicare HMO | Admitting: Vascular Surgery

## 2016-01-08 ENCOUNTER — Encounter (INDEPENDENT_AMBULATORY_CARE_PROVIDER_SITE_OTHER): Payer: Self-pay | Admitting: Vascular Surgery

## 2016-01-08 VITALS — BP 149/80 | HR 94 | Resp 17 | Ht 71.0 in | Wt 216.8 lb

## 2016-01-08 DIAGNOSIS — M79604 Pain in right leg: Secondary | ICD-10-CM | POA: Diagnosis not present

## 2016-01-08 DIAGNOSIS — E118 Type 2 diabetes mellitus with unspecified complications: Secondary | ICD-10-CM | POA: Diagnosis not present

## 2016-01-08 DIAGNOSIS — M48062 Spinal stenosis, lumbar region with neurogenic claudication: Secondary | ICD-10-CM | POA: Diagnosis not present

## 2016-01-08 DIAGNOSIS — M79609 Pain in unspecified limb: Secondary | ICD-10-CM | POA: Insufficient documentation

## 2016-01-08 DIAGNOSIS — M79605 Pain in left leg: Secondary | ICD-10-CM

## 2016-01-08 DIAGNOSIS — E119 Type 2 diabetes mellitus without complications: Secondary | ICD-10-CM | POA: Insufficient documentation

## 2016-01-08 DIAGNOSIS — E785 Hyperlipidemia, unspecified: Secondary | ICD-10-CM | POA: Diagnosis not present

## 2016-01-08 NOTE — Progress Notes (Signed)
Patient ID: [name] Cruse Markwood Jr., male   DOB: [birth year], 70 y.o.   MRN: [id number]  Chief Complaint  Patient presents with  . New Patient (Initial Visit)    HPI [name] [name]. is a 70 y.o. male.  I am asked to see the patient by Dr. Joya Salm for evaluation of PAD as a possible cause of lower extremity pain.  The patient reports severe pain in both legs. This has gone on for many years. He was assessed for peripheral arterial disease but this is been about 4-5 years ago predating his back surgery. He states that the back surgery did not help his leg pain much at all. He can only walk. Short distances before having to stop. He is become essentially disabled secondary to his leg problems. He has a long list of atherosclerotic risk factors as listed below. His assessment was normal many years ago, but he has had no assessment since. He does not have ulceration or infection. Both lower extremities are affected. His symptoms have been steadily progressing for many years.   Past Medical History:  Diagnosis Date  . Arthritis   . Coronary artery disease   . Depression    STARTED AFTER  OPEN HEART SURG.  . Diabetes mellitus without complication (HCC)    Borderline  . Foot pain, bilateral   . Gout   . Hypertension   . Insomnia 10/27/2012  . Lumbosacral spinal stenosis   . Myocardial infarction   . Neuropathy ([place])    FIBRO        SEES  DR. Loreta Ave  . Restless leg syndrome   . Stroke [facility])    2008  RIGHT SIDE...HAD FOR A COUPLE OF DAYS AND THEN WENT AWAY  . Stroke ([place])    [place]  2008    Past Surgical History:  Procedure Laterality Date  . CORONARY ARTERY BYPASS GRAFT     (2010 @ [place]  . EYE SURGERY     ?? BLEPHOPLASTY  . HAND SURGERY     CRUSHING INJURY---1966  HAS ABOUT 6 SURGERIES THEN  . HAND SURGERY Right   . HERNIA REPAIR     BILATERAL  . LUMBAR FUSION  11/25/2011   POSTERIOR    Family History  Problem Relation Age of Onset  . Diabetes Mother   No bleeding  disorders, clotting disorders, or aneurysms  Social History Social History  Substance Use Topics  . Smoking status: Former Smoker    Packs/day: 1.00    Years: 30.00    Start date: 01/21/1968    Quit date: 01/20/2006  . Smokeless tobacco: Former Systems developer    Quit date: 01/25/2006  . Alcohol use 7.2 oz/week    4 Glasses of wine, 8 Shots of liquor per week     Comment: occasional  Married, wife accompanies him today  No Known Allergies  Current Outpatient Prescriptions  Medication Sig Dispense Refill  . acetaminophen (TYLENOL) 325 MG tablet Take 650 mg by mouth every 6 (six) hours as needed.    [name] aspirin 81 MG tablet Take 81 mg by mouth daily.    . Cholecalciferol (VITAMIN D-3) 1000 UNITS CAPS Take 1 capsule by mouth daily.    . DULoxetine (CYMBALTA) 30 MG capsule Take 30-60 mg by mouth 2 (two) times daily. 2 after breakfast  1 after lunch    . fenofibrate 160 MG tablet     . HYDROcodone-acetaminophen (NORCO/VICODIN) 5-325 MG per tablet Take 1  tablet by mouth every 6 (six) hours as needed for pain. 40 tablet 0  . metFORMIN (GLUCOPHAGE) 500 MG tablet Take by mouth every other day.    . metoprolol succinate (TOPROL-XL) 25 MG 24 hr tablet Take 25 mg by mouth 2 (two) times daily.     . nitroGLYCERIN (NITROSTAT) 0.4 MG SL tablet Place 0.4 mg under the tongue every 5 (five) minutes as needed for chest pain.    [name] OVER THE COUNTER MEDICATION Place 1-2 drops into both eyes daily. Multi Action Relief Lubricant Eye Drop OTC    . Potassium 99 MG TABS Take 99 mg by mouth daily.    . simvastatin (ZOCOR) 40 MG tablet Take 40 mg by mouth at bedtime.    . Choline Fenofibrate (TRILIPIX) 135 MG capsule Take 135 mg by mouth at bedtime.     No current facility-administered medications for this visit.       REVIEW OF SYSTEMS (Negative unless checked)  Constitutional: [] Weight loss  [] Fever  [] Chills Cardiac: [] Chest pain   [] Chest pressure   [] Palpitations   [] Shortness of breath when laying flat    [] Shortness of breath at rest   [] Shortness of breath with exertion. Vascular:  [x] Pain in legs with walking   [] Pain in legs at rest   [] Pain in legs when laying flat   [x] Claudication   [x] Pain in feet when walking  [] Pain in feet at rest  [] Pain in feet when laying flat   [] History of DVT   [] Phlebitis   [] Swelling in legs   [] Varicose veins   [] Non-healing ulcers Pulmonary:   [] Uses home oxygen   [] Productive cough   [] Hemoptysis   [] Wheeze  [] COPD   [] Asthma Neurologic:  [] Dizziness  [] Blackouts   [] Seizures   [] History of stroke   [] History of TIA  [] Aphasia   [] Temporary blindness   [] Dysphagia   [] Weakness or numbness in arms   [] Weakness or numbness in legs Musculoskeletal:  [x] Arthritis   [] Joint swelling   [] Joint pain   [x] Low back pain Hematologic:  [] Easy bruising  [] Easy bleeding   [] Hypercoagulable state   [] Anemic  [] Hepatitis Gastrointestinal:  [] Blood in stool   [] Vomiting blood  [] Gastroesophageal reflux/heartburn   [] Abdominal pain Genitourinary:  [] Chronic kidney disease   [] Difficult urination  [] Frequent urination  [] Burning with urination   [] Hematuria Skin:  [] Rashes   [] Ulcers   [] Wounds Psychological:  [] History of anxiety   []  History of major depression.    Physical Exam BP (!) 149/80   Pulse 94   Resp 17   Ht 5\' 11"  (1.803 m)   Wt 216 lb 12.8 oz (98.3 kg)   BMI 30.24 kg/m  Gen:  WD/WN, NAD Head: [place]/AT, No temporalis wasting. Prominent temp pulse not noted. Ear/Nose/Throat: Hearing grossly intact, nares w/o erythema or drainage, oropharynx w/o Erythema/Exudate Eyes: Conjunctiva clear, sclera non-icteric  Neck: trachea midline.  No JVD.  Pulmonary:  Good air movement, respirations not labored, no use of accessory muscles Cardiac: RRR, normal S1, S2 Vascular:  Vessel Right Left  Radial Palpable Palpable  Ulnar Palpable Palpable  Brachial Palpable Palpable  Carotid Palpable, without bruit Palpable, without bruit  Aorta Not palpable N/A  Femoral Palpable  Palpable  Popliteal Palpable Palpable  PT Palpable 1+ Palpable  DP 1+ Palpable 1+ Palpable   Gastrointestinal: soft, non-tender/non-distended. No guarding/reflex. No masses, surgical incisions, or scars. Musculoskeletal: M/S 5/5 throughout.  Extremities without ischemic changes.  No deformity or atrophy.  Neurologic: Sensation decreased in  lower extremities.  Symmetrical.  Speech is fluent. Motor exam as listed above. Psychiatric: Judgment intact, Mood & affect appropriate for pt's clinical situation. Dermatologic: No rashes or ulcers noted.  No cellulitis or open wounds. Lymph : No Cervical, Axillary, or Inguinal lymphadenopathy.   Radiology No results found.  Labs No results found for this or any previous visit (from the past 2160 hour(s)).  Assessment/Plan:  Diabetes ([place]) blood glucose control important in reducing the progression of atherosclerotic disease. Also, involved in wound healing. On appropriate medications.   Hyperlipidemia lipid control important in reducing the progression of atherosclerotic disease. Continue statin therapy   Lumbar stenosis Had back surgery but this did not help.  Pain in limb The patient has lower extremity pain that sounds worrisome for arterial insufficiency with claudication. Although he has been previously assessed for arterial disease and it was unrevealing, this was many years ago. He states that this was before his back surgery which was 4 years ago. I think it would be prudent to reassess him for arterial insufficiency particularly given his multiple atherosclerotic risk factors as listed above. I have discussed the pathophysiology and natural history of peripheral vascular disease. I will see him back following these noninvasive studies to discuss the results and determine further treatment options.      Leotis Pain 01/08/2016, 10:58 AM   This note was created with Dragon medical transcription system.  Any errors from dictation are  unintentional.

## 2016-01-08 NOTE — Assessment & Plan Note (Signed)
Had back surgery but this did not help.

## 2016-01-08 NOTE — Assessment & Plan Note (Signed)
blood glucose control important in reducing the progression of atherosclerotic disease. Also, involved in wound healing. On appropriate medications.  

## 2016-01-08 NOTE — Patient Instructions (Signed)

## 2016-01-08 NOTE — Assessment & Plan Note (Signed)
lipid control important in reducing the progression of atherosclerotic disease. Continue statin therapy  

## 2016-01-08 NOTE — Assessment & Plan Note (Signed)
The patient has lower extremity pain that sounds worrisome for arterial insufficiency with claudication. Although he has been previously assessed for arterial disease and it was unrevealing, this was many years ago. He states that this was before his back surgery which was 4 years ago. I think it would be prudent to reassess him for arterial insufficiency particularly given his multiple atherosclerotic risk factors as listed above. I have discussed the pathophysiology and natural history of peripheral vascular disease. I will see him back following these noninvasive studies to discuss the results and determine further treatment options.

## 2016-02-01 ENCOUNTER — Ambulatory Visit (INDEPENDENT_AMBULATORY_CARE_PROVIDER_SITE_OTHER): Payer: Medicare HMO

## 2016-02-01 ENCOUNTER — Ambulatory Visit (INDEPENDENT_AMBULATORY_CARE_PROVIDER_SITE_OTHER): Payer: Medicare HMO | Admitting: Vascular Surgery

## 2016-02-01 ENCOUNTER — Encounter (INDEPENDENT_AMBULATORY_CARE_PROVIDER_SITE_OTHER): Payer: Self-pay | Admitting: Vascular Surgery

## 2016-02-01 VITALS — BP 146/77 | HR 95 | Resp 17 | Wt 214.0 lb

## 2016-02-01 DIAGNOSIS — M79605 Pain in left leg: Secondary | ICD-10-CM

## 2016-02-01 DIAGNOSIS — E785 Hyperlipidemia, unspecified: Secondary | ICD-10-CM

## 2016-02-01 DIAGNOSIS — E118 Type 2 diabetes mellitus with unspecified complications: Secondary | ICD-10-CM | POA: Diagnosis not present

## 2016-02-01 DIAGNOSIS — M79604 Pain in right leg: Secondary | ICD-10-CM

## 2016-02-01 DIAGNOSIS — M48062 Spinal stenosis, lumbar region with neurogenic claudication: Secondary | ICD-10-CM

## 2016-02-01 NOTE — Assessment & Plan Note (Signed)
blood glucose control important in reducing the progression of atherosclerotic disease. Also, involved in wound healing. On appropriate medications.  

## 2016-02-01 NOTE — Assessment & Plan Note (Signed)
His ABIs today are completely normal at 1.1 bilaterally with normal waveforms and pressures throughout both lower extremities. He does not appear to have any significant arterial insufficiency. No further vascular evaluation or treatment is needed. Return to clinic as needed.

## 2016-02-01 NOTE — Progress Notes (Signed)
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 is a 71 y.o. (Dec 30, 1945) male who presents with chief complaint of  Chief Complaint  Patient presents with  . Follow-up  .  History of Present Illness: Patient returns today in follow up of His leg pain with his arterial studies. His symptoms are unchanged. His pain remains debilitating. His ABIs today are completely normal at 1.1 bilaterally with normal waveforms and pressures throughout both lower extremities. He does not appear to have any significant arterial insufficiency.        Past Medical History:  Diagnosis Date  . Arthritis   . Coronary artery disease   . Depression    STARTED AFTER  OPEN HEART SURG.  . Diabetes mellitus without complication (HCC)    Borderline  . Foot pain, bilateral   . Gout   . Hypertension   . Insomnia 10/27/2012  . Lumbosacral spinal stenosis   . Myocardial infarction   . Neuropathy ([place])    FIBRO        SEES  DR. Loreta Ave  . Restless leg syndrome   . Stroke [facility])    2008  RIGHT SIDE...HAD FOR A COUPLE OF DAYS AND THEN WENT AWAY  . Stroke ([place])    [place]  2008         Past Surgical History:  Procedure Laterality Date  . CORONARY ARTERY BYPASS GRAFT     (2010 @ [place]  . EYE SURGERY     ?? BLEPHOPLASTY  . HAND SURGERY     CRUSHING INJURY---1966  HAS ABOUT 6 SURGERIES THEN  . HAND SURGERY Right   . HERNIA REPAIR     BILATERAL  . LUMBAR FUSION  11/25/2011   POSTERIOR         Family History  Problem Relation Age of Onset  . Diabetes Mother   No bleeding disorders, clotting disorders, or aneurysms  Social History        Social History  Substance Use Topics  . Smoking status: Former Smoker    Packs/day: 1.00    Years: 30.00    Start date: 01/21/1968    Quit date: 01/20/2006  . Smokeless tobacco: Former Systems developer    Quit date: 01/25/2006  . Alcohol use 7.2 oz/week     4 Glasses of wine, 8 Shots of liquor per week      Comment:  occasional  Married, wife accompanies him today  No Known Allergies        Current Outpatient Prescriptions  Medication Sig Dispense Refill  . acetaminophen (TYLENOL) 325 MG tablet Take 650 mg by mouth every 6 (six) hours as needed.    [name] aspirin 81 MG tablet Take 81 mg by mouth daily.    . Cholecalciferol (VITAMIN D-3) 1000 UNITS CAPS Take 1 capsule by mouth daily.    . DULoxetine (CYMBALTA) 30 MG capsule Take 30-60 mg by mouth 2 (two) times daily. 2 after breakfast  1 after lunch    . fenofibrate 160 MG tablet     . HYDROcodone-acetaminophen (NORCO/VICODIN) 5-325 MG per tablet Take 1 tablet by mouth every 6 (six) hours as needed for pain. 40 tablet 0  . metFORMIN (GLUCOPHAGE) 500 MG tablet Take by mouth every other day.    . metoprolol succinate (TOPROL-XL) 25 MG 24 hr tablet Take 25 mg by mouth 2 (two) times daily.     . nitroGLYCERIN (NITROSTAT) 0.4 MG SL tablet Place 0.4 mg under the tongue every  5 (five) minutes as needed for chest pain.    [name] OVER THE COUNTER MEDICATION Place 1-2 drops into both eyes daily. Multi Action Relief Lubricant Eye Drop OTC    . Potassium 99 MG TABS Take 99 mg by mouth daily.    . simvastatin (ZOCOR) 40 MG tablet Take 40 mg by mouth at bedtime.    . Choline Fenofibrate (TRILIPIX) 135 MG capsule Take 135 mg by mouth at bedtime.     No current facility-administered medications for this visit.       REVIEW OF SYSTEMS (Negative unless checked)  Constitutional: [] Weight loss  [] Fever  [] Chills Cardiac: [] Chest pain   [] Chest pressure   [] Palpitations   [] Shortness of breath when laying flat   [] Shortness of breath at rest   [] Shortness of breath with exertion. Vascular:  [x] Pain in legs with walking   [] Pain in legs at rest   [] Pain in legs when laying flat   [x] Claudication   [x] Pain in feet when walking  [] Pain in feet at rest  [] Pain in feet when laying flat   [] History of DVT   [] Phlebitis   [] Swelling in legs   [] Varicose  veins   [] Non-healing ulcers Pulmonary:   [] Uses home oxygen   [] Productive cough   [] Hemoptysis   [] Wheeze  [] COPD   [] Asthma Neurologic:  [] Dizziness  [] Blackouts   [] Seizures   [] History of stroke   [] History of TIA  [] Aphasia   [] Temporary blindness   [] Dysphagia   [] Weakness or numbness in arms   [] Weakness or numbness in legs Musculoskeletal:  [x] Arthritis   [] Joint swelling   [] Joint pain   [x] Low back pain Hematologic:  [] Easy bruising  [] Easy bleeding   [] Hypercoagulable state   [] Anemic  [] Hepatitis Gastrointestinal:  [] Blood in stool   [] Vomiting blood  [] Gastroesophageal reflux/heartburn   [] Abdominal pain Genitourinary:  [] Chronic kidney disease   [] Difficult urination  [] Frequent urination  [] Burning with urination   [] Hematuria Skin:  [] Rashes   [] Ulcers   [] Wounds Psychological:  [] History of anxiety   []  History of major depression.    Physical Exam BP (!) 149/80   Pulse 94   Resp 17   Ht 5\' 11"  (1.803 m)   Wt 216 lb 12.8 oz (98.3 kg)   BMI 30.24 kg/m  Gen:  WD/WN, NAD Head: [place]/AT, No temporalis wasting. Prominent temp pulse not noted. Ear/Nose/Throat: Hearing grossly intact, nares w/o erythema or drainage, oropharynx w/o Erythema/Exudate Eyes: Conjunctiva clear, sclera non-icteric  Neck: trachea midline.  No JVD.  Pulmonary:  Good air movement, respirations not labored, no use of accessory muscles Cardiac: RRR, normal S1, S2 Vascular:  Vessel Right Left  Radial Palpable Palpable  Ulnar Palpable Palpable  Brachial Palpable Palpable  Carotid Palpable, without bruit Palpable, without bruit  Aorta Not palpable N/A  Femoral Palpable Palpable  Popliteal Palpable Palpable  PT Palpable 1+ Palpable  DP 1+ Palpable 1+ Palpable   Gastrointestinal: soft, non-tender/non-distended. No guarding/reflex. No masses, surgical incisions, or scars. Musculoskeletal: M/S 5/5 throughout.  Extremities without ischemic changes.  No deformity or atrophy.  Neurologic: Sensation  decreased in lower extremities.  Symmetrical.  Speech is fluent. Motor exam as listed above. Psychiatric: Judgment intact, Mood & affect appropriate for pt's clinical situation. Dermatologic: No rashes or ulcers noted.  No cellulitis or open wounds. Lymph : No Cervical, Axillary, or Inguinal lymphadenopathy.     Labs No results found for this or any previous visit (from the past 2160 hour(s)).  Radiology No  results found.    Assessment/Plan  Diabetes (HCC) blood glucose control important in reducing the progression of atherosclerotic disease. Also, involved in wound healing. On appropriate medications.   Hyperlipidemia lipid control important in reducing the progression of atherosclerotic disease. Continue statin therapy   Lumbar stenosis With normal ABIs, this is likely the cause of his LE symptoms.  Pain in limb His ABIs today are completely normal at 1.1 bilaterally with normal waveforms and pressures throughout both lower extremities. He does not appear to have any significant arterial insufficiency. No further vascular evaluation or treatment is needed. Return to clinic as needed.    Leotis Pain, MD  02/01/2016 3:57 PM    This note was created with Dragon medical transcription system.  Any errors from dictation are purely unintentional

## 2016-02-01 NOTE — Assessment & Plan Note (Signed)
lipid control important in reducing the progression of atherosclerotic disease. Continue statin therapy  

## 2016-02-01 NOTE — Assessment & Plan Note (Signed)
With normal ABIs, this is likely the cause of his LE symptoms.

## 2018-04-15 DIAGNOSIS — I4819 Other persistent atrial fibrillation: Secondary | ICD-10-CM | POA: Insufficient documentation

## 2018-05-19 ENCOUNTER — Encounter: Admission: RE | Payer: Self-pay | Source: Home / Self Care

## 2018-05-19 ENCOUNTER — Ambulatory Visit: Admission: RE | Admit: 2018-05-19 | Payer: Medicare HMO | Source: Home / Self Care | Admitting: Internal Medicine

## 2018-05-19 SURGERY — COLONOSCOPY WITH PROPOFOL
Anesthesia: General

## 2018-10-15 ENCOUNTER — Other Ambulatory Visit
Admission: RE | Admit: 2018-10-15 | Discharge: 2018-10-15 | Disposition: A | Discharge status: Home / Self Care | Payer: Medicare HMO | Source: Ambulatory Visit | Attending: Internal Medicine | Admitting: Internal Medicine

## 2018-10-15 ENCOUNTER — Other Ambulatory Visit: Payer: Self-pay

## 2018-10-15 DIAGNOSIS — Z01812 Encounter for preprocedural laboratory examination: Secondary | ICD-10-CM | POA: Insufficient documentation

## 2018-10-15 DIAGNOSIS — U071 COVID-19: Secondary | ICD-10-CM | POA: Insufficient documentation

## 2018-10-16 LAB — SARS CORONAVIRUS 2 (TAT 6-24 HRS): SARS Coronavirus 2: POSITIVE — AB

## 2018-10-20 ENCOUNTER — Ambulatory Visit: Admission: RE | Admit: 2018-10-20 | Payer: Medicare HMO | Source: Home / Self Care | Admitting: Internal Medicine

## 2018-10-20 ENCOUNTER — Encounter: Admission: RE | Payer: Self-pay | Source: Home / Self Care

## 2018-10-20 SURGERY — COLONOSCOPY WITH PROPOFOL
Anesthesia: General

## 2018-11-05 ENCOUNTER — Other Ambulatory Visit: Admission: RE | Admit: 2018-11-05 | Payer: Medicare HMO | Source: Ambulatory Visit

## 2018-11-09 ENCOUNTER — Encounter: Payer: Self-pay | Admitting: *Deleted

## 2018-11-10 ENCOUNTER — Ambulatory Visit: Payer: Medicare HMO | Admitting: Registered Nurse

## 2018-11-10 ENCOUNTER — Encounter
Admission: RE | Disposition: A | Discharge status: Home / Self Care | Payer: Self-pay | Source: Home / Self Care | Attending: General Surgery

## 2018-11-10 ENCOUNTER — Ambulatory Visit
Admission: RE | Admit: 2018-11-10 | Discharge: 2018-11-10 | Disposition: A | Discharge status: Home / Self Care | Payer: Medicare HMO | Attending: General Surgery | Admitting: General Surgery

## 2018-11-10 ENCOUNTER — Other Ambulatory Visit: Payer: Self-pay

## 2018-11-10 DIAGNOSIS — Z7982 Long term (current) use of aspirin: Secondary | ICD-10-CM | POA: Diagnosis not present

## 2018-11-10 DIAGNOSIS — E1140 Type 2 diabetes mellitus with diabetic neuropathy, unspecified: Secondary | ICD-10-CM | POA: Insufficient documentation

## 2018-11-10 DIAGNOSIS — I252 Old myocardial infarction: Secondary | ICD-10-CM | POA: Diagnosis not present

## 2018-11-10 DIAGNOSIS — I10 Essential (primary) hypertension: Secondary | ICD-10-CM | POA: Insufficient documentation

## 2018-11-10 DIAGNOSIS — F329 Major depressive disorder, single episode, unspecified: Secondary | ICD-10-CM | POA: Diagnosis not present

## 2018-11-10 DIAGNOSIS — I2510 Atherosclerotic heart disease of native coronary artery without angina pectoris: Secondary | ICD-10-CM | POA: Insufficient documentation

## 2018-11-10 DIAGNOSIS — D123 Benign neoplasm of transverse colon: Secondary | ICD-10-CM | POA: Diagnosis not present

## 2018-11-10 DIAGNOSIS — I4891 Unspecified atrial fibrillation: Secondary | ICD-10-CM | POA: Diagnosis not present

## 2018-11-10 DIAGNOSIS — E1151 Type 2 diabetes mellitus with diabetic peripheral angiopathy without gangrene: Secondary | ICD-10-CM | POA: Diagnosis not present

## 2018-11-10 DIAGNOSIS — Z7901 Long term (current) use of anticoagulants: Secondary | ICD-10-CM | POA: Insufficient documentation

## 2018-11-10 DIAGNOSIS — M109 Gout, unspecified: Secondary | ICD-10-CM | POA: Diagnosis not present

## 2018-11-10 DIAGNOSIS — Z87891 Personal history of nicotine dependence: Secondary | ICD-10-CM | POA: Diagnosis not present

## 2018-11-10 DIAGNOSIS — Z951 Presence of aortocoronary bypass graft: Secondary | ICD-10-CM | POA: Diagnosis not present

## 2018-11-10 DIAGNOSIS — Z7984 Long term (current) use of oral hypoglycemic drugs: Secondary | ICD-10-CM | POA: Insufficient documentation

## 2018-11-10 DIAGNOSIS — R195 Other fecal abnormalities: Secondary | ICD-10-CM | POA: Diagnosis present

## 2018-11-10 DIAGNOSIS — Z8673 Personal history of transient ischemic attack (TIA), and cerebral infarction without residual deficits: Secondary | ICD-10-CM | POA: Insufficient documentation

## 2018-11-10 DIAGNOSIS — Z79899 Other long term (current) drug therapy: Secondary | ICD-10-CM | POA: Diagnosis not present

## 2018-11-10 HISTORY — PX: COLONOSCOPY WITH PROPOFOL: SHX5780

## 2018-11-10 LAB — GLUCOSE, CAPILLARY: Glucose-Capillary: 132 mg/dL — ABNORMAL HIGH (ref 70–99)

## 2018-11-10 SURGERY — COLONOSCOPY WITH PROPOFOL
Anesthesia: General

## 2018-11-10 MED ORDER — SODIUM CHLORIDE 0.9 % IV SOLN
INTRAVENOUS | Status: DC
  Administered 2018-11-10: 10:00:00 via INTRAVENOUS

## 2018-11-10 MED ORDER — PROPOFOL 500 MG/50ML IV EMUL
INTRAVENOUS | Status: DC | PRN
  Administered 2018-11-10: 140 ug/kg/min via INTRAVENOUS

## 2018-11-10 MED ORDER — PROPOFOL 10 MG/ML IV BOLUS
INTRAVENOUS | Status: DC | PRN
  Administered 2018-11-10: 70 mg via INTRAVENOUS

## 2018-11-10 NOTE — Op Note (Signed)
[facility] Gastroenterology Patient Name: [name] [name] Procedure Date: 11/10/2018 9:57 AM MRN: [id number] Account #: [id number] Date of Birth: 1946/01/17 Admit Type: Outpatient Age: 73 Room: [facility] ENDO ROOM 1 Gender: Male Note Status: Finalized Procedure:            Colonoscopy Indications:          Positive Cologuard test Providers:            Robert Bellow, MD Medicines:            Monitored Anesthesia Care Complications:        No immediate complications. Procedure:            Pre-Anesthesia Assessment:                       - Prior to the procedure, a History and Physical was                        performed, and patient medications, allergies and                        sensitivities were reviewed. The patient's tolerance of                        previous anesthesia was reviewed.                       - The risks and benefits of the procedure and the                        sedation options and risks were discussed with the                        patient. All questions were answered and informed                        consent was obtained.                       After obtaining informed consent, the colonoscope was                        passed under direct vision. Throughout the procedure,                        the patient's blood pressure, pulse, and oxygen                        saturations were monitored continuously. The                        Colonoscope was introduced through the anus and                        advanced to the the terminal ileum. The colonoscopy was                        performed without difficulty. The patient tolerated the                        procedure well. The quality of the bowel preparation  was excellent. Findings:      A 5 mm polyp was found in the proximal ascending colon. The polyp was       sessile. This was biopsied with a cold forceps for histology.      The retroflexed view of the distal  rectum and anal verge was normal and       showed no anal or rectal abnormalities. Impression:           - One 5 mm polyp in the proximal ascending colon.                        Biopsied.                       - The distal rectum and anal verge are normal on                        retroflexion view. Recommendation:       - Telephone endoscopist for pathology results in 1 week. Procedure Code(s):    --- Professional ---                       [phone], Colonoscopy, flexible; with biopsy, single or                        multiple Diagnosis Code(s):    --- Professional ---                       K63.5, Polyp of colon                       R19.5, Other fecal abnormalities CPT copyright 2019 American Medical Association. All rights reserved. The codes documented in this report are preliminary and upon coder review Diiorio  be revised to meet current compliance requirements. Robert Bellow, MD 11/10/2018 10:34:17 AM This report has been signed electronically. Number of Addenda: 0 Note Initiated On: 11/10/2018 9:57 AM Scope Withdrawal Time: 0 hours 10 minutes 45 seconds  Total Procedure Duration: 0 hours 16 minutes 58 seconds  Estimated Blood Loss: Estimated blood loss: none.      [facility]

## 2018-11-10 NOTE — Anesthesia Postprocedure Evaluation (Signed)
Anesthesia Post Note  Patient: [name] Senger Sobol Jr.  Procedure(s) Performed: COLONOSCOPY WITH PROPOFOL (N/A )  Patient location during evaluation: PACU Anesthesia Type: General Level of consciousness: awake and alert and oriented Pain management: pain level controlled Vital Signs Assessment: post-procedure vital signs reviewed and stable Respiratory status: spontaneous breathing Cardiovascular status: blood pressure returned to baseline Anesthetic complications: no     Last Vitals:  Vitals:   11/10/18 1056 11/10/18 1106  BP: (!) 126/48 (!) 141/81  Pulse: 80 83  Resp: 18 (!) 22  Temp:    SpO2: 95% 96%    Last Pain:  Vitals:   11/10/18 1056  TempSrc:   PainSc: 0-No pain                 Malaysha Arlen

## 2018-11-10 NOTE — Anesthesia Post-op Follow-up Note (Signed)
Anesthesia QCDR form completed.        

## 2018-11-10 NOTE — H&P (Signed)
[name] [name]. [id number] 1945-01-29     HPI:  73 y.o male for a screening colonoscopy. History of positive Cologuard test earlier this year.  Exam delayed secondary to pandemic.   He reports irregular bowel movements, but denies mucous in the stools. He discontinued his Eliquis two days ago as directed.    Medications Prior to Admission  Medication Sig Dispense Refill Last Dose  . apixaban (ELIQUIS) 2.5 MG TABS tablet Take 2.5 mg by mouth 2 (two) times daily.   11/05/2018  . Cholecalciferol (VITAMIN D-3) 1000 UNITS CAPS Take 1 capsule by mouth daily.   11/10/2018 at Unknown time  . Choline Fenofibrate (TRILIPIX) 135 MG capsule Take 135 mg by mouth at bedtime.   11/10/2018 at Unknown time  . colchicine 0.6 MG tablet Take 0.6 mg by mouth as directed.   Past Month at Unknown time  . DULoxetine (CYMBALTA) 30 MG capsule Take 30-60 mg by mouth 2 (two) times daily. 2 after breakfast  1 after lunch   11/10/2018 at Unknown time  . fenofibrate 160 MG tablet    11/10/2018 at Unknown time  . HYDROcodone-acetaminophen (NORCO/VICODIN) 5-325 MG per tablet Take 1 tablet by mouth every 6 (six) hours as needed for pain. 40 tablet 0 Past Week at Unknown time  . irbesartan (AVAPRO) 300 MG tablet Take 300 mg by mouth daily.   Past Week at Unknown time  . metFORMIN (GLUCOPHAGE) 500 MG tablet Take by mouth every other day.   11/09/2018 at Unknown time  . metoprolol succinate (TOPROL-XL) 25 MG 24 hr tablet Take 25 mg by mouth 2 (two) times daily.    11/10/2018 at 0630  . Potassium 99 MG TABS Take 99 mg by mouth daily.   11/09/2018 at Unknown time  . simvastatin (ZOCOR) 40 MG tablet Take 40 mg by mouth at bedtime.   11/09/2018 at Unknown time  . acetaminophen (TYLENOL) 325 MG tablet Take 650 mg by mouth every 6 (six) hours as needed.     [name] aspirin 81 MG tablet Take 81 mg by mouth daily.   Not Taking at Unknown time  . nitroGLYCERIN (NITROSTAT) 0.4 MG SL tablet Place 0.4 mg under the tongue every 5 (five) minutes as  needed for chest pain.     [name] OVER THE COUNTER MEDICATION Place 1-2 drops into both eyes daily. Multi Action Relief Lubricant Eye Drop OTC      No Known Allergies Past Medical History:  Diagnosis Date  . Arthritis   . Coronary artery disease   . Depression    STARTED AFTER  OPEN HEART SURG.  . Diabetes mellitus without complication (HCC)    Borderline  . Foot pain, bilateral   . Gout   . Hypertension   . Insomnia 10/27/2012  . Lumbosacral spinal stenosis   . Myocardial infarction ([place])   . 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 [address]  . Restless leg syndrome   . Stroke [facility])    2008  RIGHT SIDE...HAD FOR A COUPLE OF DAYS AND THEN WENT AWAY  . Stroke ([place])    [place]  2008   Past Surgical History:  Procedure Laterality Date  . BACK SURGERY    . CARDIAC CATHETERIZATION    . CORONARY ARTERY BYPASS GRAFT     (2010 @ [facility]  . EYE SURGERY     ?? BLEPHOPLASTY  . HAND SURGERY     CRUSHING INJURY---1966  HAS ABOUT 6 SURGERIES THEN  . HAND SURGERY Right   . HERNIA REPAIR     BILATERAL  . LUMBAR FUSION  11/25/2011   POSTERIOR   Social History   Socioeconomic History  . Marital status: Married    Spouse name: Not on file  . Number of children: Not on file  . Years of education: Not on file  . Highest education level: Not on file  Occupational History  . Not on file  Social Needs  . Financial resource strain: Not on file  . Food insecurity    Worry: Not on file    Inability: Not on file  . Transportation needs    Medical: Not on file    Non-medical: Not on file  Tobacco Use  . Smoking status: Former Smoker    Packs/day: 1.00    Years: 30.00    Pack years: 30.00    Start date: 01/21/1968    Quit date: 01/20/2006    Years since quitting: 12.8  . Smokeless tobacco: Former Systems developer    Quit date: 01/25/2006  Substance and Sexual Activity  . Alcohol use: Yes    Alcohol/week: 12.0 standard drinks    Types: 4 Glasses of wine, 8 Shots of liquor per week     Comment: occasional  . Drug use: No  . Sexual activity: Not on file  Lifestyle  . Physical activity    Days per week: Not on file    Minutes per session: Not on file  . Stress: Not on file  Relationships  . Social Herbalist on phone: Not on file    Gets together: Not on file    Attends religious service: Not on file    Active member of club or organization: Not on file    Attends meetings of clubs or organizations: Not on file    Relationship status: Not on file  . Intimate partner violence    Fear of current or ex partner: Not on file    Emotionally abused: Not on file    Physically abused: Not on file    Forced sexual activity: Not on file  Other Topics Concern  . Not on file  Social History Narrative  . Not on file   Social History   Social History Narrative  . Not on file     ROS: Negative.     PE: HEENT: Negative. Lungs: Clear. Cardio: RR.  Assessment/Plan:  Proceed with planned endoscopy. Forest Gleason Kirubel Aja 11/10/2018

## 2018-11-10 NOTE — Transfer of Care (Signed)
Immediate Anesthesia Transfer of Care Note  Patient: [name] Giampapa Malecha Jr.  Procedure(s) Performed: COLONOSCOPY WITH PROPOFOL (N/A )  Patient Location: PACU  Anesthesia Type:General  Level of Consciousness: sedated  Airway & Oxygen Therapy: Patient Spontanous Breathing  Post-op Assessment: Report given to RN and Post -op Vital signs reviewed and stable  Post vital signs: Reviewed and stable  Last Vitals:  Vitals Value Taken Time  BP 119/61 11/10/18 1036  Temp 36.4 C 11/10/18 1036  Pulse 92 11/10/18 1036  Resp 20 11/10/18 1036  SpO2 96 % 11/10/18 1036  Vitals shown include unvalidated device data.  Last Pain:  Vitals:   11/10/18 1036  TempSrc: Tympanic  PainSc:          Complications: No apparent anesthesia complications

## 2018-11-10 NOTE — Anesthesia Preprocedure Evaluation (Addendum)
Anesthesia Evaluation  Patient identified by MRN, date of birth, ID band Patient awake    Reviewed: Allergy & Precautions, H&P , NPO status , Patient's Chart, lab work & pertinent test results  Airway Mallampati: II  TM Distance: >3 FB Neck ROM: Full    Dental  (+) Teeth Intact, Dental Advisory Given   Pulmonary former smoker,    breath sounds clear to auscultation       Cardiovascular hypertension, + CAD, + Past MI and + Peripheral Vascular Disease  + dysrhythmias Atrial Fibrillation  Rhythm:Regular     Neuro/Psych PSYCHIATRIC DISORDERS Depression  Neuromuscular disease CVA, No Residual Symptoms    GI/Hepatic Neg liver ROS,   Endo/Other  diabetes  Renal/GU negative Renal ROS  negative genitourinary   Musculoskeletal  (+) Arthritis ,   Abdominal   Peds negative pediatric ROS (+)  Hematology negative hematology ROS (+)   Anesthesia Other Findings Past Medical History: No date: Arthritis No date: Coronary artery disease No date: Depression     Comment:  STARTED AFTER  OPEN HEART SURG. No date: Diabetes mellitus without complication (HCC)     Comment:  Borderline No date: Foot pain, bilateral No date: Gout No date: Hypertension 10/27/2012: Insomnia No date: Lumbosacral spinal stenosis No date: Myocardial infarction ([place]) No date: Neuropathy     Comment:  FIBRO        SEES  DR. [address]ta [address] No date: Restless leg syndrome No date: Stroke [facility])     Comment:  2008  RIGHT SIDE...HAD FOR A COUPLE OF DAYS AND THEN               WENT AWAY No date: Stroke [facility])     Comment:  WENT TO [place]  2008  Reproductive/Obstetrics                            Anesthesia Physical  Anesthesia Plan  ASA: III  Anesthesia Plan: General   Post-op Pain Management:    Induction: Intravenous  PONV Risk Score and Plan: TIVA  Airway Management Planned: Nasal Cannula  Additional Equipment:   Intra-op  Plan:   Post-operative Plan:   Informed Consent: I have reviewed the patients History and Physical, chart, labs and discussed the procedure including the risks, benefits and alternatives for the proposed anesthesia with the patient or authorized representative who has indicated his/her understanding and acceptance.     Dental advisory given  Plan Discussed with: CRNA and Surgeon  Anesthesia Plan Comments:         Anesthesia Quick Evaluation

## 2018-11-11 LAB — SURGICAL PATHOLOGY

## 2020-01-17 ENCOUNTER — Other Ambulatory Visit: Payer: Self-pay

## 2020-01-17 ENCOUNTER — Emergency Department: Payer: Medicare HMO

## 2020-01-17 ENCOUNTER — Emergency Department
Admission: EM | Admit: 2020-01-17 | Discharge: 2020-01-19 | Disposition: A | Discharge status: Home / Self Care | Payer: Medicare HMO | Attending: Emergency Medicine | Admitting: Emergency Medicine

## 2020-01-17 DIAGNOSIS — Z8673 Personal history of transient ischemic attack (TIA), and cerebral infarction without residual deficits: Secondary | ICD-10-CM | POA: Diagnosis not present

## 2020-01-17 DIAGNOSIS — Z79899 Other long term (current) drug therapy: Secondary | ICD-10-CM | POA: Diagnosis not present

## 2020-01-17 DIAGNOSIS — I10 Essential (primary) hypertension: Secondary | ICD-10-CM | POA: Diagnosis not present

## 2020-01-17 DIAGNOSIS — Z87891 Personal history of nicotine dependence: Secondary | ICD-10-CM | POA: Insufficient documentation

## 2020-01-17 DIAGNOSIS — W01190A Fall on same level from slipping, tripping and stumbling with subsequent striking against furniture, initial encounter: Secondary | ICD-10-CM | POA: Diagnosis not present

## 2020-01-17 DIAGNOSIS — Z951 Presence of aortocoronary bypass graft: Secondary | ICD-10-CM | POA: Insufficient documentation

## 2020-01-17 DIAGNOSIS — I2510 Atherosclerotic heart disease of native coronary artery without angina pectoris: Secondary | ICD-10-CM | POA: Diagnosis not present

## 2020-01-17 DIAGNOSIS — R55 Syncope and collapse: Secondary | ICD-10-CM | POA: Diagnosis present

## 2020-01-17 DIAGNOSIS — E1140 Type 2 diabetes mellitus with diabetic neuropathy, unspecified: Secondary | ICD-10-CM | POA: Diagnosis not present

## 2020-01-17 DIAGNOSIS — W19XXXA Unspecified fall, initial encounter: Secondary | ICD-10-CM

## 2020-01-17 DIAGNOSIS — Z7984 Long term (current) use of oral hypoglycemic drugs: Secondary | ICD-10-CM | POA: Insufficient documentation

## 2020-01-17 DIAGNOSIS — Z7901 Long term (current) use of anticoagulants: Secondary | ICD-10-CM | POA: Diagnosis not present

## 2020-01-17 LAB — TROPONIN I (HIGH SENSITIVITY)
Troponin I (High Sensitivity): 10 ng/L (ref <18)
Troponin I (High Sensitivity): 8 ng/L (ref <18)

## 2020-01-17 LAB — BASIC METABOLIC PANEL
Anion gap: 11 (ref 5–15)
BUN: 19 mg/dL (ref 8–23)
CO2: 27 mmol/L (ref 22–32)
Calcium: 8.9 mg/dL (ref 8.9–10.3)
Chloride: 101 mmol/L (ref 98–111)
Creatinine, Ser: 0.98 mg/dL (ref 0.61–1.24)
GFR, Estimated: >60 mL/min (ref >60)
Glucose, Bld: 136 mg/dL — ABNORMAL HIGH (ref 70–99)
Potassium: 3.9 mmol/L (ref 3.5–5.1)
Sodium: 139 mmol/L (ref 135–145)

## 2020-01-17 LAB — CBG MONITORING, ED: Glucose-Capillary: 126 mg/dL — ABNORMAL HIGH (ref 70–99)

## 2020-01-17 LAB — CBC
HCT: 41.6 % (ref 39.0–52.0)
Hemoglobin: 14.2 g/dL (ref 13.0–17.0)
MCH: 33.7 pg (ref 26.0–34.0)
MCHC: 34.1 g/dL (ref 30.0–36.0)
MCV: 98.8 fL (ref 80.0–100.0)
Platelets: 119 10*3/uL — ABNORMAL LOW (ref 150–400)
RBC: 4.21 MIL/uL — ABNORMAL LOW (ref 4.22–5.81)
RDW: 13.3 % (ref 11.5–15.5)
WBC: 7 10*3/uL (ref 4.0–10.5)
nRBC: 0 % (ref 0.0–0.2)

## 2020-01-17 MED ORDER — ONDANSETRON HCL 4 MG/2ML IJ SOLN
INTRAMUSCULAR | Status: AC
  Administered 2020-01-17: 4 mg via INTRAVENOUS
  Filled 2020-01-17: qty 2

## 2020-01-17 MED ORDER — ONDANSETRON HCL 4 MG/2ML IJ SOLN: 4.0000 mg | Freq: Once | INTRAMUSCULAR | Status: AC

## 2020-01-17 MED ORDER — FENTANYL CITRATE (PF) 100 MCG/2ML IJ SOLN
50.0000 ug | INTRAMUSCULAR | Status: AC | PRN
  Administered 2020-01-17 (×2): 50 ug via INTRAVENOUS
  Filled 2020-01-17 (×2): qty 2

## 2020-01-17 NOTE — ED Notes (Signed)
Pt also reporting chest pain with deep breathing.

## 2020-01-17 NOTE — ED Notes (Signed)
PT starting to call out reporting the pain is worse. Ccollar removed and pt rolled himself to his side. Pt started vomiting.

## 2020-01-17 NOTE — ED Triage Notes (Addendum)
Pt to ED reporting he woke this afternoon from a nap, stood from the bed and had a syncopal episode and hit his head on a desk. Pt regained consciousness, stood and fell again hitting his back on the dresser. Pt reporting neck and back pain at this time. Pt is taking blood thinners.   Pt reports some soreness in his chest as well. Unsure if it is from the fall, pt does not have memory of the first fall. Pt reporting he has had an increased number of falls recently.

## 2020-01-18 LAB — URINALYSIS, COMPLETE (UACMP) WITH MICROSCOPIC
Bilirubin Urine: NEGATIVE
Glucose, UA: 50 mg/dL — AB
Hgb urine dipstick: NEGATIVE
Ketones, ur: NEGATIVE mg/dL
Leukocytes,Ua: NEGATIVE
Nitrite: NEGATIVE
Protein, ur: 30 mg/dL — AB
Specific Gravity, Urine: 1.016 (ref 1.005–1.030)
pH: 7 (ref 5.0–8.0)

## 2020-01-18 MED ORDER — HYDROCODONE-ACETAMINOPHEN 5-325 MG PO TABS: 1.0000 | ORAL_TABLET | Freq: Four times a day (QID) | ORAL | Status: DC | PRN

## 2020-01-18 MED ORDER — ACETAMINOPHEN 325 MG PO TABS: 650.0000 mg | ORAL_TABLET | Freq: Four times a day (QID) | ORAL | Status: DC | PRN

## 2020-01-18 MED ORDER — METOPROLOL SUCCINATE ER 50 MG PO TB24
100.0000 mg | ORAL_TABLET | Freq: Two times a day (BID) | ORAL | Status: DC
  Administered 2020-01-18: 100 mg via ORAL
  Filled 2020-01-18: qty 2

## 2020-01-18 MED ORDER — FENOFIBRATE 160 MG PO TABS
160.0000 mg | ORAL_TABLET | Freq: Every day | ORAL | Status: DC
  Filled 2020-01-18 (×2): qty 1

## 2020-01-18 MED ORDER — IRBESARTAN 150 MG PO TABS
300.0000 mg | ORAL_TABLET | Freq: Every day | ORAL | Status: DC
  Administered 2020-01-18: 22:00:00 300 mg via ORAL
  Filled 2020-01-18 (×2): qty 2

## 2020-01-18 MED ORDER — METFORMIN HCL 500 MG PO TABS: 500.0000 mg | ORAL_TABLET | Freq: Every day | ORAL | Status: DC

## 2020-01-18 MED ORDER — SIMVASTATIN 10 MG PO TABS
40.0000 mg | ORAL_TABLET | Freq: Every day | ORAL | Status: DC
  Administered 2020-01-18: 40 mg via ORAL
  Filled 2020-01-18: qty 4

## 2020-01-18 MED ORDER — DULOXETINE HCL 30 MG PO CPEP
30.0000 mg | ORAL_CAPSULE | Freq: Two times a day (BID) | ORAL | Status: DC
  Administered 2020-01-18: 22:00:00 30 mg via ORAL
  Filled 2020-01-18 (×2): qty 1

## 2020-01-18 MED ORDER — APIXABAN 5 MG PO TABS
5.0000 mg | ORAL_TABLET | Freq: Two times a day (BID) | ORAL | Status: DC
  Administered 2020-01-18: 22:00:00 5 mg via ORAL
  Filled 2020-01-18: qty 1

## 2020-01-18 MED ORDER — FOSFOMYCIN TROMETHAMINE 3 G PO PACK
3.0000 g | PACK | Freq: Once | ORAL | Status: AC
  Administered 2020-01-18: 3 g via ORAL
  Filled 2020-01-18: qty 3

## 2020-01-18 MED ORDER — ACETAMINOPHEN 500 MG PO TABS
1000.0000 mg | ORAL_TABLET | Freq: Once | ORAL | Status: AC
  Administered 2020-01-18: 1000 mg via ORAL
  Filled 2020-01-18: qty 2

## 2020-01-18 NOTE — ED Notes (Signed)
Pt tried to ambulate with a walker.  Pt could not.  md aware.

## 2020-01-18 NOTE — ED Notes (Addendum)
Report off to raquel rn  

## 2020-01-18 NOTE — ED Notes (Signed)
Pt calm at this time. Provided pt with water. Pt states he has no further needs at this time

## 2020-01-18 NOTE — ED Provider Notes (Signed)
Today's Vitals   01/18/20 1543 01/18/20 1615 01/18/20 1815 01/18/20 1835  BP: (!) 161/79 (!) 148/76 (!) 142/62 (!) 159/91  Pulse: 81 81  90  Resp: 18 20  18   Temp:      TempSrc:      SpO2: 96% 95%  93%  Weight:      Height:      PainSc:       Body mass index is 29.85 kg/m.   Patient resting comfortably, family at bedside.  Understanding of plan and await disposition with social work.  Patient currently just finishing a meal   , MD 01/18/20 1929

## 2020-01-18 NOTE — ED Notes (Signed)
This RN on phone with pt's wife, Brynda Peon. Wife updated on POC and pt's request to leave.

## 2020-01-18 NOTE — TOC Initial Note (Addendum)
Transition of Care (TOC) - Initial/Assessment Note    Patient Details  Name: [name] Mathieson Lavallee Jr. MRN: [id number] Date of Birth: 01-Woodson-1947  Transition of Care [facility]) CM/SW Contact:    Marina Goodell Phone Number: [phone] 01/18/2020, 5:00 PM  Clinical Narrative:                  Patient presents to [facility] due to fall.  CSW spoke with patient and Vanessen,[name] (Spouse) [phone] and explained role of TOC in patient care. Patein lives at home with Ms. Muecke.  Patient stated he is able to perform all ADLs but uses a rollator walker to ambulate.  Patient stated he is able to get to the doctor's office ,receive his medication and take his medications without issues.  Patient stated he is reluctant to go to SNF placement but will do whatever PT recommends. Patient stated he is not interested in [facility].  CSW sent FL2 for signature. SNF placement search started.  Expected Discharge Plan: Skilled Nursing Facility Barriers to Discharge: ED SNF auth,SNF Pending bed offer   Patient Goals and CMS Choice   CMS Medicare.gov Compare Post Acute Care list provided to:: Patient Choice offered to / list presented to : Patient,Spouse (Kauth,[name] (Spouse)   [phone])  Expected Discharge Plan and Services Expected Discharge Plan: Skilled Nursing Facility In-house Referral: Clinical Social Work   Post Acute Care Choice: Skilled Nursing Facility Living arrangements for the past 2 months: Single Family Home                                      Prior Living Arrangements/Services Living arrangements for the past 2 months: Single Family Home Lives with:: Spouse Patient language and need for interpreter reviewed:: Yes Do you feel safe going back to the place where you live?: Yes      Need for Family Participation in Patient Care: Yes (Comment) Care giver support system in place?: Yes (comment) Current home services: DME (rollator) Criminal Activity/Legal Involvement Pertinent to  Current Situation/Hospitalization: No - Comment as needed  Activities of Daily Living      Permission Sought/Granted Permission sought to share information with : Facility Medical sales representative Permission granted to share information with : Yes, Verbal Permission Granted  Share Information with NAME: Moree,[name] (Spouse)   [phone]           Emotional Assessment Appearance:: Appears stated age Attitude/Demeanor/Rapport: Engaged Affect (typically observed): Stable Orientation: : Oriented to Self,Oriented to Place,Oriented to  Time,Oriented to Situation Alcohol / Substance Use: Not Applicable Psych Involvement: No (comment)  Admission diagnosis:  Fall-Back/chest Pain Patient Active Problem List   Diagnosis Date Noted  . Diabetes (HCC) 01/08/2016  . Hyperlipidemia 01/08/2016  . Pain in limb 01/08/2016  . Insomnia 10/27/2012  . Unspecified hereditary and idiopathic peripheral neuropathy 03/09/2012  . Lumbar stenosis 12/04/2011   PCP:  Jaclyn Shaggy, MD Pharmacy:   [facility] [place], [place] - [address]er [place] [address] Phone: [phone] Fax: [phone]     Social Determinants of Health (SDOH) Interventions    Readmission Risk Interventions No flowsheet data found.

## 2020-01-18 NOTE — ED Notes (Signed)
Pt moved to [facility] by this nurse, wife follows. Pt assisted to be in position of comfort.

## 2020-01-18 NOTE — ED Notes (Signed)
This RN coming out of room 4 when pt trying to get out of bed. Pt stating, "I'm leaving and want to go home." This RN explaining POC. Pt stating, "I don't care, I am getting out of this damn place one way or the other. I have been here 3 damn days and haven't had anything done."

## 2020-01-18 NOTE — ED Provider Notes (Signed)
Patient in the ED after a fall pending SNF placement. Evaluated by PT and OT. Patient requesting to leave. Patient is able to ambulate and do most of ADLs. Has all the equipment necessary at home. Here with wife who feels comfortable taking him home.  Labs, PT, OT notes reviewed by me. Recommended close follow up with PCP and discussed return precautions with both.    Don Perking, [place], MD 01/18/20 [phone]

## 2020-01-18 NOTE — ED Notes (Signed)
Pt resting comfortably with eyes closed, no distress noted.  

## 2020-01-18 NOTE — ED Notes (Signed)
Report received from [facility] RN. Patient care assumed. Patient/RN introduction complete. Will continue to monitor.

## 2020-01-18 NOTE — Evaluation (Signed)
Occupational Therapy Evaluation Patient Details Name: [name] Cumbo Andel Jr. MRN: [id number] DOB: Feb 07, 1945 Today's Date: 01/18/2020    History of Present Illness Pt to ED reporting he woke this afternoon from a nap, stood from the bed and had a syncopal episode and hit his head on a desk. Pt regained consciousness, stood and fell again hitting his back on the dresser. Pt reporting neck and back pain at this time. Pt is taking blood thinners.      Pt reports some soreness in his chest as well. Unsure if it is from the fall, pt does not have memory of the first fall. Pt reporting he has had an increased number of falls recently.   Clinical Impression   Pt seen for OT evaluation this date in setting of acute hospitalization d/t syncopal episodes at home. Pt and spouse report that pt requires some assist for LB ADLs at home at baseline, but is usually able to walk, stand, toilet, and perform grooming/self-feeding without assistance. Pt uses shower chair to bathe and cane for fxl mobility at baseline. Pt presents this date with decreased fxl activity tolerance and strength. Pt pt requires MOD A for LB ADLs seated and standing, SETUP to MIN A for seated UB ADLs. MIN A for transfers with RW. Pt requiring increased assist versus baseline. OT will continue to follow in acute setting. Anticipate pt will require skilled OT f/u in STR setting.  Of note: OT assesses for orthostatic VS since pt with report of two syncopal episodes. Sup BP: 148/91, sitting 150/98, standing 140/80 so pt's BP not orthostatic.    Follow Up Recommendations  SNF    Equipment Recommendations  None recommended by OT (pt and spouse report that pt has all necessary equipment.)    Recommendations for Other Services       Precautions / Restrictions Precautions Precautions: Fall Restrictions Weight Bearing Restrictions: No      Mobility Bed Mobility Overal bed mobility: Modified Independent;Needs Assistance Bed Mobility: Supine to  Sit;Sit to Supine     Supine to sit: Min assist Sit to supine: Modified independent (Device/Increase time)   General bed mobility comments: pt to chair pre/post session    Transfers Overall transfer level: Needs assistance Equipment used: Rolling walker (2 wheeled) Transfers: Sit to/from Stand Sit to Stand: Min assist         General transfer comment: requires MIN A to CTS from chair with RW with cues for safe hand placement.    Balance Overall balance assessment: Needs assistance Sitting-balance support: Feet supported Sitting balance-Leahy Scale: Good     Standing balance support: Bilateral upper extremity supported;During functional activity Standing balance-Leahy Scale: Fair Standing balance comment: reliant on UE support                           ADL either performed or assessed with clinical judgement   ADL Overall ADL's : Needs assistance/impaired                                       General ADL Comments: pt requires MOD A for LB ADLs seated and standing, SETUP to MIN A for seated UB ADLs. MIN A for transfers with RW.     Vision Baseline Vision/History: Wears glasses Patient Visual Report: No change from baseline       Perception     Praxis  Pertinent Vitals/Pain Pain Assessment: Faces Faces Pain Scale: Hurts little more Pain Location: R side/back ( large bruise from fall) Pain Descriptors / Indicators: Aching;Guarding;Grimacing;Discomfort;Sore Pain Intervention(s): Limited activity within patient's tolerance;Monitored during session     Hand Dominance Right   Extremity/Trunk Assessment Upper Extremity Assessment Upper Extremity Assessment: Generalized weakness   Lower Extremity Assessment Lower Extremity Assessment: Generalized weakness   Cervical / Trunk Assessment Cervical / Trunk Assessment: Normal   Communication Communication Communication: No difficulties   Cognition Arousal/Alertness:  Awake/alert Behavior During Therapy: WFL for tasks assessed/performed Overall Cognitive Status: Within Functional Limits for tasks assessed                                     General Comments  OT assesses for orthostatic VS since pt with report of two syncopal episodes. Sup BP: 148/91, sitting 150/98, standing 140/80 so pt's BP not orthostatic.    Exercises Other Exercises Other Exercises: OT faciltiaets ed re: role of OT in acute setting, importance of OOB activity, importance of spouse allowing pt to do as much for himself as possible, potential benefits of therapy f/u both in hospital as well as outside of hospital setting. Pt open to possibility of f/u with STR, but would prefer HH.   Shoulder Instructions      Home Living Family/patient expects to be discharged to:: Private residence Living Arrangements: Spouse/significant other Available Help at Discharge: Family;Available 24 hours/day Type of Home: House Home Access: Stairs to enter [employer] of Steps: 4 Entrance Stairs-Rails: None Home Layout: One level     Bathroom Shower/Tub: Tub/shower unit         Home Equipment: Environmental consultant - 2 wheels;Cane - single point;Bedside commode;Shower seat   Additional Comments: pt usees shower seat, reports he has not used BSC since back sx in 2013      Prior Functioning/Environment Level of Independence: Needs assistance  Gait / Transfers Assistance Needed: walks with cane or 2WW, states he uses cane most often. reports >5 falls in a year. ADL's / Homemaking Assistance Needed: Pt reports his spouse assists with bathing/dressing. Pt with limited R UE ROM d/t h/o stroke, so some help with SETUP for meals/grooming as well.   Comments: Pt's spouse performs all HH IADLs inlcuding taking pt to appts and getting groceries.        OT Problem List: Decreased strength;Impaired balance (sitting and/or standing);Decreased activity tolerance;Decreased  coordination;Decreased safety awareness;Decreased knowledge of use of DME or AE;Impaired UE functional use      OT Treatment/Interventions: Self-care/ADL training;DME and/or AE instruction;Therapeutic activities;Balance training;Therapeutic exercise;Energy conservation;Patient/family education    OT Goals(Current goals can be found in the care plan section) Acute Rehab OT Goals Patient Stated Goal: to go to rehab to get stronger then go home OT Goal Formulation: With patient/family Time For Goal Achievement: 02/01/20 Potential to Achieve Goals: Good ADL Goals Pt Will Perform Lower Body Dressing: with supervision;sit to/from stand (with AE PRN) Pt Will Transfer to Toilet: with supervision;ambulating;grab bars (with LRAD) Pt/caregiver will Perform Home Exercise Program: Increased strength;Both right and left upper extremity;With Supervision (mindful of pt's back pain)  OT Frequency: Min 1X/week   Barriers to D/C:            Co-evaluation              AM-PAC OT "6 Clicks" Daily Activity     Outcome Measure Help from another person eating meals?:  None Help from another person taking care of personal grooming?: A Little Help from another person toileting, which includes using toliet, bedpan, or urinal?: A Lot Help from another person bathing (including washing, rinsing, drying)?: A Lot Help from another person to put on and taking off regular upper body clothing?: A Little Help from another person to put on and taking off regular lower body clothing?: A Lot 6 Click Score: 16   End of Session Equipment Utilized During Treatment: Gait belt;Rolling walker Nurse Communication: Mobility status  Activity Tolerance: Patient tolerated treatment well Patient left: in chair;Other (comment) (pt in hallway close to nursing station, OT provided screen for pt)  OT Visit Diagnosis: Unsteadiness on feet (R26.81);Muscle weakness (generalized) (M62.81)                Time: 3893-7342 OT Time  Calculation (min): 47 min Charges:  OT General Charges $OT Visit: 1 Visit OT Evaluation $OT Eval Moderate Complexity: 1 Mod OT Treatments $Self Care/Home Management : 23-37 mins $Therapeutic Activity: 8-22 mins  Rejeana Brock, MS, OTR/L ascom [phone] 01/18/20, 4:44 PM

## 2020-01-18 NOTE — ED Provider Notes (Signed)
[facility] Emergency Department Provider Note   ____________________________________________   Event Date/Time   First MD Initiated Contact with Patient 01/18/20 0005     (approximate)  I have reviewed the triage vital signs and the nursing notes.   HISTORY  Chief Complaint No chief complaint on file.    HPI [name] Grimm Calandra Montez Hageman. is a 74 y.o. male stated past medical history of depression, CAD, type 2 diabetes, and hypertension who presents after he woke from a nap and when he stood up from his bed had a syncopal episode in which he hit his head on a desk.  Patient states that he regained consciousness soon afterward and stood straight up before losing consciousness again and falling back on his dresser.  Patient reports severe right-sided hip and neck pain that is 10/10, nonradiating, and worse with attempting to ambulate.  Patient denies any other complaints.  Patient denies any subsequent loss of consciousness, nausea/vomiting, numbness/paresthesias in any extremity, vision changes, or chest pain/shortness of breath         Past Medical History:  Diagnosis Date   Arthritis    Coronary artery disease    Depression    STARTED AFTER  OPEN HEART SURG.   Diabetes mellitus without complication (HCC)    Borderline   Foot pain, bilateral    Gout    Hypertension    Insomnia 10/27/2012   Lumbosacral spinal stenosis    Myocardial infarction (HCC)    Neuropathy    FIBRO        SEES  DR. Kirtland Bouchard  [place]   Restless leg syndrome    Stroke [facility])    2008  RIGHT SIDE...HAD FOR A COUPLE OF DAYS AND THEN WENT AWAY   Stroke (HCC)    WENT TO [place]  2008    Patient Active Problem List   Diagnosis Date Noted   Diabetes (HCC) 01/08/2016   Hyperlipidemia 01/08/2016   Pain in limb 01/08/2016   Insomnia 10/27/2012   Unspecified hereditary and idiopathic peripheral neuropathy 03/09/2012   Lumbar stenosis 12/04/2011    Past Surgical History:   Procedure Laterality Date   BACK SURGERY     CARDIAC CATHETERIZATION     COLONOSCOPY WITH PROPOFOL N/A 11/10/2018   Procedure: COLONOSCOPY WITH PROPOFOL;  Surgeon: Earline Mayotte, MD;  Location: [facility] ENDOSCOPY;  Service: Endoscopy;  Laterality: N/A;   CORONARY ARTERY BYPASS GRAFT     (2010 @ [facility]   EYE SURGERY     ?? BLEPHOPLASTY   HAND SURGERY     CRUSHING INJURY---1966  HAS ABOUT 6 SURGERIES THEN   HAND SURGERY Right    HERNIA REPAIR     BILATERAL   LUMBAR FUSION  11/25/2011   POSTERIOR    Prior to Admission medications   Medication Sig Start Date End Date Taking? Authorizing Provider  acetaminophen (TYLENOL) 325 MG tablet Take 650 mg by mouth every 6 (six) hours as needed for mild pain or moderate pain.   Yes Love, Evlyn Kanner, PA-C  apixaban (ELIQUIS) 5 MG TABS tablet Take 5 mg by mouth 2 (two) times daily.   Yes [provider]  Cholecalciferol (VITAMIN D-3) 1000 UNITS CAPS Take 1,000 Units by mouth daily.   Yes [provider]  DULoxetine (CYMBALTA) 30 MG capsule Take 30 mg by mouth 2 (two) times daily.   Yes [provider]  fenofibrate 160 MG tablet Take 160 mg by mouth daily.   Yes [provider]  HYDROcodone-acetaminophen (NORCO/VICODIN) 5-325 MG tablet Take 1 tablet by mouth every 6 (six) hours as needed for moderate pain.   Yes [provider]  irbesartan (AVAPRO) 300 MG tablet Take 300 mg by mouth daily.   Yes [provider]  metFORMIN (GLUCOPHAGE) 500 MG tablet Take 500 mg by mouth daily.   Yes [provider]  metoprolol (TOPROL-XL) 200 MG 24 hr tablet Take 100 mg by mouth 2 (two) times daily.   Yes [provider]  Potassium 99 MG TABS Take 99 mg by mouth daily.   Yes [provider]  simvastatin (ZOCOR) 40 MG tablet Take 40 mg by mouth at bedtime.   Yes [provider]    Allergies Patient has no known allergies.  Family History  Problem Relation Age of Onset    Diabetes Mother     Social History Social History   Tobacco Use   Smoking status: Former Smoker    Packs/day: 1.00    Years: 30.00    Pack years: 30.00    Start date: 01/21/1968    Quit date: 01/20/2006    Years since quitting: 14.0   Smokeless tobacco: Former Neurosurgeon    Quit date: 01/25/2006  Vaping Use   Vaping Use: Never used  Substance Use Topics   Alcohol use: Yes    Alcohol/week: 12.0 standard drinks    Types: 4 Glasses of wine, 8 Shots of liquor per week    Comment: occasional   Drug use: No    Review of Systems Constitutional: No fever/chills Eyes: No visual changes. ENT: No sore throat. Cardiovascular: Denies chest pain. Respiratory: Denies shortness of breath. Gastrointestinal: No abdominal pain.  No nausea, no vomiting.  No diarrhea. Genitourinary: Negative for dysuria. Musculoskeletal: Positive for acute arthralgias Skin: Negative for rash. Neurological: Negative for headaches, weakness/numbness/paresthesias in any extremity Psychiatric: Negative for suicidal ideation/homicidal ideation   ____________________________________________   PHYSICAL EXAM:  VITAL SIGNS: ED Triage Vitals  Enc Vitals Group     BP 01/17/20 1913 (!) 178/127     Pulse Rate 01/17/20 1913 79     Resp 01/17/20 1913 16     Temp 01/17/20 1913 98.9 F (37.2 C)     Temp Source 01/17/20 1913 Oral     SpO2 01/17/20 1913 97 %     Weight 01/17/20 1917 214 lb (97.1 kg)     Height 01/17/20 1917 5\' 11"  (1.803 m)     Head Circumference --      Peak Flow --      Pain Score --      Pain Loc --      Pain Edu? --      Excl. in GC? --    Constitutional: Alert and oriented. Well appearing and in no acute distress. Eyes: Conjunctivae are normal. PERRL. Head: Atraumatic. Nose: No congestion/rhinnorhea. Mouth/Throat: Mucous membranes are moist. Neck: No stridor Cardiovascular: Grossly normal heart sounds.  Good peripheral circulation. Respiratory: Normal respiratory effort.  No  retractions. Gastrointestinal: Soft and nontender. No distention. Musculoskeletal: No obvious deformities Neurologic:  Normal speech and language. No gross focal neurologic deficits are appreciated.  Unable to ambulate with walker Skin:  Skin is warm and dry. No rash noted. Psychiatric: Mood and affect are normal. Speech and behavior are normal.  ____________________________________________   LABS (all labs ordered are listed, but only abnormal results are displayed)  Labs Reviewed  BASIC METABOLIC PANEL - Abnormal; Notable for the following components:      Result Value  Glucose, Bld 136 (*)    All other components within normal limits  CBC - Abnormal; Notable for the following components:   RBC 4.21 (*)    Platelets 119 (*)    All other components within normal limits  URINALYSIS, COMPLETE (UACMP) WITH MICROSCOPIC - Abnormal; Notable for the following components:   Color, Urine YELLOW (*)    APPearance HAZY (*)    Glucose, UA 50 (*)    Protein, ur 30 (*)    Bacteria, UA MANY (*)    All other components within normal limits  CBG MONITORING, ED - Abnormal; Notable for the following components:   Glucose-Capillary 126 (*)    All other components within normal limits  TROPONIN I (HIGH SENSITIVITY)  TROPONIN I (HIGH SENSITIVITY)   ____________________________________________  EKG  ED ECG REPORT I, Naaman Plummer, the attending physician, personally viewed and interpreted this ECG.  Date: 01/18/2020 EKG Time: 1923 Rate: 77 Rhythm: Atrial fibrillation QRS Axis: normal Intervals: normal ST/T Wave abnormalities: normal Narrative Interpretation: no evidence of acute ischemia  ____________________________________________  RADIOLOGY  ED MD interpretation: 2 view x-ray of the chest shows no evidence of acute abnormalities including no fractures, dislocations, pneumothorax, widened mediastinum  X-rays of the thoracic and lumbar spine show no evidence of acute  abnormalities including no fractures, dislocations, or listhesis  CT of the head and C-spine without contrast showed no evidence of acute abnormalities including no fractures, dislocations, listhesis, subluxation, or acute hemorrhage  Official radiology report(s): No results found.  ____________________________________________   PROCEDURES  Procedure(s) performed (including Critical Care):  .1-3 Lead EKG Interpretation Performed by: Naaman Plummer, MD Authorized by: Naaman Plummer, MD     Interpretation: normal     ECG rate:  88   ECG rate assessment: normal     Rhythm: sinus rhythm     Ectopy: none     Conduction: normal       ____________________________________________   INITIAL IMPRESSION / ASSESSMENT AND PLAN / ED COURSE  As part of my medical decision making, I reviewed the following data within the [place] notes reviewed and incorporated, Labs reviewed, EKG interpreted, Old chart reviewed, Radiograph reviewed and Notes from prior ED visits reviewed and incorporated        Patient is a 74 year old male that presents after mechanical fall complaining of left-sided pain  Radiologic evaluation of this pain does not show any evidence of acute abnormalities.  Patient's EKG shows atrial fibrillation which is persistent for him.  Attempts were made to ambulate patient with a walker which is his baseline at home and he was unable to do so.  Patient will therefore require physical and occupational therapy evaluation and recommendations.  Care of this patient will be signed out to the oncoming physician at the end of my shift.  All pertinent patient information conveyed and all questions answered.  All further care and disposition decisions will be made by the oncoming physician.      ____________________________________________   FINAL CLINICAL IMPRESSION(S) / ED DIAGNOSES  Final diagnoses:  Fall, initial encounter     ED Discharge  Orders    None       Note:  This document was prepared using Dragon voice recognition software and Manfredo include unintentional dictation errors.   Naaman Plummer, MD 01/18/20 [phone]

## 2020-01-18 NOTE — ED Notes (Signed)
Report received from Erskine Squibb, RN at this time, pt resting comfortably in bed with wife at bedside.

## 2020-01-18 NOTE — ED Notes (Signed)
Pt transferred to a recliner - states he is more comfortable now. Pt is calm and cooperative at this time with no further complaints

## 2020-01-18 NOTE — ED Notes (Signed)
Pt has back and neck pain after a syncopal episode today.  Pt denies chest pain or sob.  Pt came in via ems from home.  Pt states recent falls lately.  Iv in place  Pt alert  Speech clear.  Pt in hallway bed.

## 2020-01-18 NOTE — Evaluation (Signed)
Physical Therapy Evaluation Patient Details Name: [name] Banner Ottley Jr. MRN: [id number] DOB: 1945/10/15 Today's Date: 01/18/2020   History of Present Illness  Pt to ED reporting he woke this afternoon from a nap, stood from the bed and had a syncopal episode and hit his head on a desk. Pt regained consciousness, stood and fell again hitting his back on the dresser. Pt reporting neck and back pain at this time. Pt is taking blood thinners.      Pt reports some soreness in his chest as well. Unsure if it is from the fall, pt does not have memory of the first fall. Pt reporting he has had an increased number of falls recently.  Clinical Impression  Patient received in bed ( on stretcher). Agreeable to PT session. He reports pain in back/side with large bruise present from fall. Patient requires min assist to sit up on stretcher. Min guard for sit to stand and to ambulate 20 feet with RW  Patient is unsteady on feet and limited by fatigue/poor endurance. He will continue to benefit from skilled PT while here to improve strength, activity tolerance and safety with mobility.       Follow Up Recommendations SNF    Equipment Recommendations       Recommendations for Other Services       Precautions / Restrictions Precautions Precautions: Fall Restrictions Weight Bearing Restrictions: No      Mobility  Bed Mobility Overal bed mobility: Modified Independent;Needs Assistance Bed Mobility: Supine to Sit;Sit to Supine     Supine to sit: Min assist Sit to supine: Modified independent (Device/Increase time)   General bed mobility comments: requires minimal assist for raising trunk into sitting due to pain, otherwise mod independence    Transfers Overall transfer level: Modified independent Equipment used: Rolling walker (2 wheeled)                Ambulation/Gait Ambulation/Gait assistance: Min guard Gait Distance (Feet): 25 Feet Assistive device: Rolling walker (2 wheeled) Gait  Pattern/deviations: Step-through pattern;Decreased step length - right;Decreased step length - left;Decreased stride length        Stairs            Wheelchair Mobility    Modified Rankin (Stroke Patients Only)       Balance Overall balance assessment: Needs assistance Sitting-balance support: Feet supported Sitting balance-Leahy Scale: Good     Standing balance support: Bilateral upper extremity supported;During functional activity Standing balance-Leahy Scale: Fair Standing balance comment: reliant on UE support                             Pertinent Vitals/Pain Pain Assessment: Faces Faces Pain Scale: Hurts little more Pain Location: R side/back ( large bruise from fall) Pain Descriptors / Indicators: Aching;Guarding;Grimacing;Discomfort;Sore Pain Intervention(s): Monitored during session;Limited activity within patient's tolerance    Home Living Family/patient expects to be discharged to:: Skilled nursing facility                      Prior Function Level of Independence: Independent         Comments: PAtient reports he uses cane when out of the home, Otherwise furniture walks.     Hand Dominance        Extremity/Trunk Assessment   Upper Extremity Assessment Upper Extremity Assessment: Generalized weakness    Lower Extremity Assessment Lower Extremity Assessment: Generalized weakness    Cervical / Trunk Assessment Cervical /  Trunk Assessment: Normal  Communication   Communication: No difficulties  Cognition Arousal/Alertness: Awake/alert Behavior During Therapy: WFL for tasks assessed/performed Overall Cognitive Status: Within Functional Limits for tasks assessed                                        General Comments      Exercises     Assessment/Plan    PT Assessment Patient needs continued PT services  PT Problem List Decreased strength;Decreased mobility;Decreased safety  awareness;Pain;Decreased balance;Decreased activity tolerance       PT Treatment Interventions Therapeutic exercise;Gait training;Functional mobility training;Therapeutic activities;Patient/family education;Balance training    PT Goals (Current goals can be found in the Care Plan section)  Acute Rehab PT Goals Patient Stated Goal: to go to rehab to get stronger then go home PT Goal Formulation: With patient/family Time For Goal Achievement: 01/27/20 Potential to Achieve Goals: Good    Frequency Min 2X/week   Barriers to discharge        Co-evaluation               AM-PAC PT "6 Clicks" Mobility  Outcome Measure Help needed turning from your back to your side while in a flat bed without using bedrails?: A Little Help needed moving from lying on your back to sitting on the side of a flat bed without using bedrails?: A Little Help needed moving to and from a bed to a chair (including a wheelchair)?: A Little Help needed standing up from a chair using your arms (e.g., wheelchair or bedside chair)?: A Little Help needed to walk in hospital room?: A Little Help needed climbing 3-5 steps with a railing? : A Lot 6 Click Score: 17    End of Session Equipment Utilized During Treatment: Gait belt Activity Tolerance: Patient tolerated treatment well Patient left: in bed;with call bell/phone within reach;with family/visitor present Nurse Communication: Mobility status PT Visit Diagnosis: Unsteadiness on feet (R26.81);Other abnormalities of gait and mobility (R26.89);Repeated falls (R29.6);Muscle weakness (generalized) (M62.81);Pain Pain - Right/Left: Right Pain - part of body:  (side)    Time: 4403-4742 PT Time Calculation (min) (ACUTE ONLY): 24 min   Charges:   PT Evaluation $PT Eval Moderate Complexity: 1 Mod PT Treatments $Gait Training: 8-22 mins        Massiah Longanecker, PT, GCS 01/18/20,4:11 PM

## 2020-01-18 NOTE — [place] FL2 (Addendum)
[place] MEDICAID FL2 LEVEL OF CARE SCREENING TOOL     IDENTIFICATION  Patient Name: [name] Basnett Bendickson Jr. Birthdate: 26-Feb-1945 Sex: male Admission Date (Current Location): 01/17/2020  [facility] and [place] Number:  Chiropodist and Address:  [facility], [address], [place], [place] [address]      Provider Number:    Attending Physician Name and Address:  No att. providers found  Relative Name and Phone Number:  Pitones,[name] (Spouse)   [phone]    Current Level of Care: Hospital Recommended Level of Care: Skilled Nursing Facility Prior Approval Number:    Date Approved/Denied:   PASRR Number: [id number]  Discharge Plan: SNF    Current Diagnoses: Patient Active Problem List   Diagnosis Date Noted   Diabetes (HCC) 01/08/2016   Hyperlipidemia 01/08/2016   Pain in limb 01/08/2016   Insomnia 10/27/2012   Unspecified hereditary and idiopathic peripheral neuropathy 03/09/2012   Lumbar stenosis 12/04/2011    Orientation RESPIRATION BLADDER Height & Weight     Self,Time,Situation,Place  Normal Continent Weight: 214 lb (97.1 kg) Height:  5\' 11"  (180.3 cm)  BEHAVIORAL SYMPTOMS/MOOD NEUROLOGICAL BOWEL NUTRITION STATUS      Continent Diet  AMBULATORY STATUS COMMUNICATION OF NEEDS Skin   Limited Assist Verbally Normal                       Personal Care Assistance Level of Assistance  Bathing,Feeding,Dressing,Total care Bathing Assistance: Independent Feeding assistance: Independent Dressing Assistance: Independent Total Care Assistance: Limited assistance   Functional Limitations Info  Sight,Hearing,Speech Sight Info: Adequate Hearing Info: Adequate Speech Info: Adequate    SPECIAL CARE FACTORS FREQUENCY       PT Min 2X per week                Contractures Contractures Info: Not present    Additional Factors Info                  Current Medications (01/18/2020):  This is the current hospital  active medication list No current facility-administered medications for this encounter.   Current Outpatient Medications  Medication Sig Dispense Refill   acetaminophen (TYLENOL) 325 MG tablet Take 650 mg by mouth every 6 (six) hours as needed for mild pain or moderate pain.     apixaban (ELIQUIS) 5 MG TABS tablet Take 5 mg by mouth 2 (two) times daily.     Cholecalciferol (VITAMIN D-3) 1000 UNITS CAPS Take 1,000 Units by mouth daily.     DULoxetine (CYMBALTA) 30 MG capsule Take 30 mg by mouth 2 (two) times daily.     fenofibrate 160 MG tablet Take 160 mg by mouth daily.     HYDROcodone-acetaminophen (NORCO/VICODIN) 5-325 MG tablet Take 1 tablet by mouth every 6 (six) hours as needed for moderate pain.     irbesartan (AVAPRO) 300 MG tablet Take 300 mg by mouth daily.     metFORMIN (GLUCOPHAGE) 500 MG tablet Take 500 mg by mouth daily.     metoprolol (TOPROL-XL) 200 MG 24 hr tablet Take 100 mg by mouth 2 (two) times daily.     Potassium 99 MG TABS Take 99 mg by mouth daily.     simvastatin (ZOCOR) 40 MG tablet Take 40 mg by mouth at bedtime.       Discharge Medications: Please see discharge summary for a list of discharge medications.  Relevant Imaging Results:  Relevant Lab Results:   Additional Information 01/20/2020  [id number]  Marketta Valadez, LCSWA

## 2020-01-18 NOTE — ED Notes (Signed)
Pt currently working with PT

## 2020-01-19 NOTE — ED Notes (Signed)
Pt able to stand on own with minimal assistance. PT did move from chair to car on own. Pt has walker and cane at home for use.

## 2020-01-19 NOTE — ED Notes (Addendum)
Pt states that he wants to go home, he states he understands the situation but that he cannot stay here any longer. PT wife is available and at bedside and states he wants to go so they will leave. Charge RN, Raquel and Dr. Don Perking notified. MD speaks to pt and provides detailed instructions. Will be reviewed with his nurses DC instructions.

## 2020-02-15 ENCOUNTER — Ambulatory Visit: Payer: Medicare HMO

## 2020-02-20 ENCOUNTER — Ambulatory Visit: Payer: Medicare HMO

## 2020-02-22 ENCOUNTER — Other Ambulatory Visit: Payer: Self-pay

## 2020-02-22 ENCOUNTER — Ambulatory Visit: Payer: Medicare HMO | Attending: Internal Medicine

## 2020-02-22 VITALS — BP 123/70 | HR 100 | Resp 17 | Ht 68.0 in | Wt 217.0 lb

## 2020-02-22 DIAGNOSIS — R296 Repeated falls: Secondary | ICD-10-CM | POA: Diagnosis not present

## 2020-02-22 DIAGNOSIS — R262 Difficulty in walking, not elsewhere classified: Secondary | ICD-10-CM | POA: Insufficient documentation

## 2020-02-22 DIAGNOSIS — M6281 Muscle weakness (generalized): Secondary | ICD-10-CM | POA: Diagnosis present

## 2020-02-22 NOTE — Therapy (Signed)
[place] MAIN [facility] SERVICES [address] [place], [place], [address] Phone: [phone]   Fax:  [phone]  Physical Therapy Evaluation  Patient Details  Name: [name] [name]. MRN: [id number] Date of Birth: 03-28-1945 Referring Provider (PT): Dr. Benita Stabile   Encounter Date: 02/22/2020   PT End of Session - 02/22/20 2235    Visit Number 1    Number of Visits 25    Date for PT Re-Evaluation 05/16/20    Authorization Type Initial PT eval on 02/22/2020    PT Start Time 0317    PT Stop Time 0425    PT Time Calculation (min) 68 min    Equipment Utilized During Treatment Gait belt    Activity Tolerance No increased pain;Patient limited by fatigue    Behavior During Therapy [facility] for tasks assessed/performed           Past Medical History:  Diagnosis Date  . Arthritis   . Coronary artery disease   . Depression    STARTED AFTER  OPEN HEART SURG.  . Diabetes mellitus without complication (HCC)    Borderline  . Foot pain, bilateral   . Gout   . Hypertension   . Insomnia 10/27/2012  . Lumbosacral spinal stenosis   . Myocardial infarction ([place])   . 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 L[address]  . Restless leg syndrome   . Stroke [facility])    2008  RIGHT SIDE...HAD FOR A COUPLE OF DAYS AND THEN WENT AWAY  . Stroke ([place])    [place]  2008    Past Surgical History:  Procedure Laterality Date  . BACK SURGERY    . CARDIAC CATHETERIZATION    . COLONOSCOPY WITH PROPOFOL N/A 11/10/2018   Procedure: COLONOSCOPY WITH PROPOFOL;  Surgeon: Robert Bellow, MD;  Location: [facility] ENDOSCOPY;  Service: Endoscopy;  Laterality: N/A;  . CORONARY ARTERY BYPASS GRAFT     (2010 @ [facility]  . EYE SURGERY     ?? BLEPHOPLASTY  . HAND SURGERY     CRUSHING INJURY---1966  HAS ABOUT 6 SURGERIES THEN  . HAND SURGERY Right   . HERNIA REPAIR     BILATERAL  . LUMBAR FUSION  11/25/2011   POSTERIOR    Vitals:   02/22/20 2229  BP: 123/70  Pulse: 100   Resp: 17  SpO2: 99%  Weight: 217 lb (98.4 kg)  Height: 5\' 8"  (1.727 m)      Subjective Assessment - 02/22/20 1530    Subjective I am so weak and I have had at least 10 falls in the last 6 months.    Patient is accompained by: Family member    Limitations Standing;Lifting;Walking;House hold activities    How long can you sit comfortably? No restrictions    How long can you stand comfortably? maybe a minute if I am lucky.    How long can you walk comfortably? Not far at all- maybe a 100 feet.    Patient Stated Goals Regain some strength so I can return to being as independent at possible.    Currently in Pain? Yes    Pain Score 2     Pain Location Back    Pain Orientation Lower    Pain Descriptors / Indicators Aching;Sore    Aggravating Factors  Prolonged standing/walking    Pain Relieving Factors Rest    Multiple Pain Sites No  OBJECTIVE  MUSCULOSKELETAL: Tremor: Absent Bulk: Normal Tone: Normal, no clonus  Posture Forward head Protracted shoulder Lumbar lordosis  Gait Forward flexed posture with decreased step length using Front wheeled walker. Limited by fatigue at around 100 feet today. Gait speed =0.51 m/s  Strength Bilateral UE strength throughout = 5/5 LE strength as below: R/L 5/5 Hip flexion 5/5 Hip external rotation 5/5 Hip internal rotation 5/5 Hip extension  5/5 Hip abduction 5/5 Hip adduction 5/5 Knee extension 5/5 Knee flexion 5/5 Ankle Plantarflexion 5/5 Ankle Dorsiflexion   NEUROLOGICAL:  Mental Status Patient is oriented to person, place and time.  Recent memory is intact.  Remote memory is intact.  Attention span and concentration are intact.  Expressive speech is intact.  Patient's fund of knowledge is within normal limits for educational level.   Sensation Grossly intact to light touch bilateral UEs/LEs as determined by testing dermatomes C2-T2/L2-S2 respectively Proprioception and hot/cold testing deferred on  this date   Coordination/Cerebellar Finger to Nose: WNL Heel to Shin: WNL Rapid alternating movements: WNL Finger Opposition: WNL  FUNCTIONAL OUTCOME MEASURES   Results Comments  BERG Will test next visit Fall risk, in need of intervention          TUG  31 seconds RW  5TSTS  35.15 seconds Bilateral hand support      10 Meter Gait Speed Self-selected: s =  0.74m/s Below normative values for full community ambulation               ASSESSMENT Clinical Impression: Pt is a pleasant 75 year-old male referred to PT secondary to falls and LE weakness. Patient reports progressive weakness over past couple of months with increased difficulty walking and performing ADLs- reporting several falls and assist from his wife with ADLs. Patient presents today with increased LE weakness as seen by MMT and 5 sec STS test; impaired gait function- unsteady, limited ambulation and at risk for falling. Patient was too fatigued to perform the BERG balance test today so will attempt to measure next visit based on his mobility he was instructed to use his walker at all times.  Pt will benefit from skilled PT services to address the above mentioned deficits in strength, gait, and balance to improve his overall functional mobility and capabilities in the home and community as well as decrease risk for future falls.            [facility] PT Assessment - 02/22/20 1541      Assessment   Medical Diagnosis Falls/LE weakness    Referring Provider (PT) Dr. Benita Stabile    Onset Date/Surgical Date 01/17/20    Hand Dominance Right    Prior Therapy 2013- Low back      Restrictions   Weight Bearing Restrictions No      Balance Screen   Has the patient fallen in the past 6 months Yes    How many times? 10    Has the patient had a decrease in activity level because of a fear of falling?  Yes    Is the patient reluctant to leave their home because of a fear of falling?  Yes      [place] Private residence    Living Arrangements Spouse/significant other    Available Help at Discharge Family    Type of [place] to enter    Entrance Stairs-Number of Steps 3    Entrance Stairs-Rails None  Home Layout One level    Home Equipment Walker - 2 wheels;Cane - single point;Bedside commode;Shower seat;Grab bars - tub/shower      Prior Function   Level of Independence Needs assistance with ADLs    Level of Independence - Bath Minimal    Vocation Retired      IT consultant   Overall Cognitive Status Within Functional Limits for tasks assessed    Attention Focused    Focused Attention Appears intact    Awareness Appears intact    Problem Solving Appears intact    Astronomer;Sequencing;Organizing;Decision Making;Initiating;Self Monitoring;Self Correcting    Reasoning Appears intact    Sequencing Appears intact    Organizing Appears intact    Decision Making Appears intact    Initiating Appears intact    Self Monitoring Appears intact    Self Correcting Appears intact      Observation/Other Assessments   Skin Integrity intact throughout                      Objective measurements completed on examination: See above findings.               PT Education - 02/22/20 2234    Education Details Educated in details of outpatient PT services.    Person(s) Educated Patient;Spouse    Methods Explanation;Verbal cues    Comprehension Verbalized understanding;Need further instruction            PT Short Term Goals - 02/22/20 2257      PT SHORT TERM GOAL #1   Title Pt will be independent with HEP in order to improve strength and balance in order to decrease fall risk and improve function at home and work.    Baseline 02/22/2020- Patient presents with no formal HEP in place.    Time 6    Period Weeks    Status New    Target Date 04/04/20             PT Long Term Goals - 02/22/20 2258      PT LONG  TERM GOAL #1   Title Patient will increase FOTO score to equal to or greater than 47 to demonstrate statistically significant improvement in mobility and quality of life.    Baseline 02/22/2020= 23    Time 12    Period Weeks    Status New    Target Date 05/16/20      PT LONG TERM GOAL #2   Title Pt will decrease 5TSTS by at least 3 seconds in order to demonstrate clinically significant improvement in LE strength.    Baseline 02/22/2020- 35.15 sec    Time 12    Period Weeks    Status New    Target Date 05/16/20      PT LONG TERM GOAL #3   Title Pt will decrease TUG to below 24 seconds/decrease in order to demonstrate decreased fall risk.    Baseline 02/22/2020= 31 sec using walker    Time 12    Period Weeks    Status New    Target Date 05/16/20      PT LONG TERM GOAL #4   Title Pt will increase by at least 0.13 m/s in order to demonstrate clinically significant improvement in community ambulation.    Baseline 02/22/2020- 0.2m/s using Front wheeled walker.    Time 12    Status New    Target Date 05/16/20      PT LONG TERM GOAL #5  Title Patient will demonstrate ambulation > 500 on all surfaces using walker with modified independence without rest breaks or loss of balance for improved ability to walk community distances.    Baseline 02/22/2020-Patient limited to less than 100 feet of total amb distance using front wheeled walker.    Time 12    Period Weeks    Status New    Target Date 05/16/20                  Plan - 02/22/20 2237    Clinical Impression Statement Pt is a pleasant 75 year-old male referred to PT secondary to falls and LE weakness. Patient reports progressive weakness over past couple of months with increased difficulty walking and performing ADLs- reporting several falls and assist from his wife with ADLs. Patient presents today with increased LE weakness as seen by MMT and 5 sec STS test; impaired gait function- unsteady, limited ambulation and at risk  for falling. Patient was too fatigued to perform the BERG balance test today so will attempt to measure next visit based on his mobility he was instructed to use his walker at all times.  Pt will benefit from skilled PT services to address the above mentioned deficits in strength, gait, and balance to improve his overall functional mobility and capabilities in the home and community as well as decrease risk for future falls.    Personal Factors and Comorbidities Age;Comorbidity 1;Comorbidity 2;Comorbidity 3+    Comorbidities Hypertension, CVA, Diabetes    Examination-Activity Limitations Bathing;Bed Mobility;Bend;Caring for Others;Carry;Dressing;Hygiene/Grooming;Lift;Locomotion Level;Reach Overhead;Squat;Stairs;Stand;Toileting;Transfers    Examination-Participation Restrictions Cleaning;Community Activity;Driving;Laundry;Medication Management;Meal Prep;Shop;Yard Work    Conservation officer, historic buildings Stable/Uncomplicated    Clinical Decision Making High    Rehab Potential Good    PT Frequency 2x / week    PT Duration 12 weeks    PT Treatment/Interventions ADLs/Self Care Home Management;DME Instruction;Gait training;Stair training;Functional mobility training;Therapeutic activities;Therapeutic exercise;Balance training;Neuromuscular re-education;Patient/family education;Manual techniques    PT Next Visit Plan LE Strengthening, transfer and gait training- educate in initial HEP and issue handout    PT Home Exercise Plan Initiate next visit.    Consulted and Agree with Plan of Care Patient;Family member/caregiver    Family Member Consulted Spouse- [name]           Patient will benefit from skilled therapeutic intervention in order to improve the following deficits and impairments:  Abnormal gait,Cardiopulmonary status limiting activity,Decreased activity tolerance,Decreased balance,Decreased coordination,Decreased endurance,Decreased knowledge of use of DME,Decreased mobility,Difficulty  walking,Decreased strength,Hypomobility,Impaired perceived functional ability,Impaired flexibility  Visit Diagnosis: Repeated falls  Difficulty in walking, not elsewhere classified  Muscle weakness (generalized)     Problem List Patient Active Problem List   Diagnosis Date Noted  . Diabetes (HCC) 01/08/2016  . Hyperlipidemia 01/08/2016  . Pain in limb 01/08/2016  . Insomnia 10/27/2012  . Unspecified hereditary and idiopathic peripheral neuropathy 03/09/2012  . Lumbar stenosis 12/04/2011    Lenda Kelp, PT 02/23/2020, 7:17 AM  [place] [facility] MAIN [facility] SERVICES [address] [place], [place], [address] Phone: [phone]   Fax:  [phone]  Name: Reg Bircher Huffstetler [name]. MRN: [id number] Date of Birth: 28-Nov-1945

## 2020-02-27 ENCOUNTER — Other Ambulatory Visit: Payer: Self-pay

## 2020-02-27 ENCOUNTER — Ambulatory Visit: Payer: Medicare HMO

## 2020-02-27 DIAGNOSIS — M6281 Muscle weakness (generalized): Secondary | ICD-10-CM

## 2020-02-27 DIAGNOSIS — R262 Difficulty in walking, not elsewhere classified: Secondary | ICD-10-CM

## 2020-02-27 DIAGNOSIS — R296 Repeated falls: Secondary | ICD-10-CM | POA: Diagnosis not present

## 2020-02-27 NOTE — Therapy (Signed)
[place] MAIN [facility] SERVICES [address] [place], [place], [address] Phone: [phone]   Fax:  [phone]  Physical Therapy Treatment  Patient Details  Name: [name] Schorr Vint Jr. MRN: [id number] Date of Birth: 29-Oct-1945 Referring Provider (PT): Dr. Benita Stabile   Encounter Date: 02/27/2020   PT End of Session - 02/27/20 1737    Visit Number 2    Number of Visits 25    Date for PT Re-Evaluation 05/16/20    Authorization Type Initial PT eval on 02/22/2020    PT Start Time 1432    PT Stop Time 1514    PT Time Calculation (min) 42 min    Equipment Utilized During Treatment Gait belt    Activity Tolerance No increased pain;Patient limited by fatigue    Behavior During Therapy [facility] for tasks assessed/performed           Past Medical History:  Diagnosis Date  . Arthritis   . Coronary artery disease   . Depression    STARTED AFTER  OPEN HEART SURG.  . Diabetes mellitus without complication (HCC)    Borderline  . Foot pain, bilateral   . Gout   . Hypertension   . Insomnia 10/27/2012  . Lumbosacral spinal stenosis   . Myocardial infarction ([place])   . 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 [address]  . Restless leg syndrome   . Stroke [facility])    2008  RIGHT SIDE...HAD FOR A COUPLE OF DAYS AND THEN WENT AWAY  . Stroke ([place])    [place]  2008    Past Surgical History:  Procedure Laterality Date  . BACK SURGERY    . CARDIAC CATHETERIZATION    . COLONOSCOPY WITH PROPOFOL N/A 11/10/2018   Procedure: COLONOSCOPY WITH PROPOFOL;  Surgeon: Robert Bellow, MD;  Location: [facility] ENDOSCOPY;  Service: Endoscopy;  Laterality: N/A;  . CORONARY ARTERY BYPASS GRAFT     (2010 @ [facility]  . EYE SURGERY     ?? BLEPHOPLASTY  . HAND SURGERY     CRUSHING INJURY---1966  HAS ABOUT 6 SURGERIES THEN  . HAND SURGERY Right   . HERNIA REPAIR     BILATERAL  . LUMBAR FUSION  11/25/2011   POSTERIOR    There were no vitals filed for this visit.    Subjective Assessment - 02/27/20 1437    Subjective I still feel really weak but excited to be here working on getting better.    Patient is accompained by: Family member    Limitations Standing;Lifting;Walking;House hold activities    How long can you sit comfortably? No restrictions    How long can you stand comfortably? maybe a minute if I am lucky.    How long can you walk comfortably? Not far at all- maybe a 100 feet.    Patient Stated Goals Regain some strength so I can return to being as independent at possible.    Currently in Pain? Yes    Pain Score 3     Pain Location Back    Pain Orientation Lower    Pain Descriptors / Indicators Aching;Sore    Pain Type Chronic pain    Pain Onset More than a month ago    Pain Frequency Intermittent    Aggravating Factors  Prolonged sitting, standing, walking    Pain Relieving Factors rest and meds    Multiple Pain Sites No  Gait training:   Patient ambulated approx 85 feet using front wheeled walker with CGA, short reciprocal steps with increased verbal cues to take a longer step with decreased UE support. Patient able to respond to cues and push walker more continuously.  Mod. Borg RPE= 8/10     Instructed patient in seated LE Strengthening:  Seated hip march Seated hip abd Seated knee ext Seated ankle DF Seated ankle PF 10 reps each LE with cues to count out loud and perform slowly with more eccentric control. Patient improved his technique with these mentioned cues and practice today. Issued HEP of above listed LE Strengthening exercises to patient at end of session.                      PT Education - 02/27/20 1731    Education Details Patient educated in exercise technique and safety with mobility.    Person(s) Educated Patient    Methods Explanation;Demonstration;Tactile cues;Verbal cues    Comprehension Verbalized understanding;Returned demonstration;Verbal cues required;Tactile cues  required;Need further instruction            PT Short Term Goals - 02/22/20 2257      PT SHORT TERM GOAL #1   Title Pt will be independent with HEP in order to improve strength and balance in order to decrease fall risk and improve function at home and work.    Baseline 02/22/2020- Patient presents with no formal HEP in place.    Time 6    Period Weeks    Status New    Target Date 04/04/20             PT Long Term Goals - 02/22/20 2258      PT LONG TERM GOAL #1   Title Patient will increase FOTO score to equal to or greater than 47 to demonstrate statistically significant improvement in mobility and quality of life.    Baseline 02/22/2020= 23    Time 12    Period Weeks    Status New    Target Date 05/16/20      PT LONG TERM GOAL #2   Title Pt will decrease 5TSTS by at least 3 seconds in order to demonstrate clinically significant improvement in LE strength.    Baseline 02/22/2020- 35.15 sec    Time 12    Period Weeks    Status New    Target Date 05/16/20      PT LONG TERM GOAL #3   Title Pt will decrease TUG to below 24 seconds/decrease in order to demonstrate decreased fall risk.    Baseline 02/22/2020= 31 sec using walker    Time 12    Period Weeks    Status New    Target Date 05/16/20      PT LONG TERM GOAL #4   Title Pt will increase 10MWT by at least 0.13 m/s in order to demonstrate clinically significant improvement in community ambulation.    Baseline 02/22/2020- 0.43m/s using Front wheeled walker.    Time 12    Status New    Target Date 05/16/20      PT LONG TERM GOAL #5   Title Patient will demonstrate ambulation > 500 on all surfaces using walker with modified independence without rest breaks or loss of balance for improved ability to walk community distances.    Baseline 02/22/2020-Patient limited to less than 100 feet of total amb distance using front wheeled walker.    Time 12    Period  Weeks    Status New    Target Date 05/16/20                  Plan - 02/27/20 1739    Clinical Impression Statement Patient performed well today with increased ambulation with improved reciprocal steps. Mild difficulty interpreting newly instructed Modified BORG PRE scale- Will benefit from review. He was able to participate well in seated LE strengthening exercies without report of increased pain- limited only by fatigue. He will benefit from skilled physical therapy to reduce risk of falls, improve strength and stability, and improve independence with ADL performance.    Personal Factors and Comorbidities Age;Comorbidity 1;Comorbidity 2;Comorbidity 3+    Comorbidities Hypertension, CVA, Diabetes    Examination-Activity Limitations Bathing;Bed Mobility;Bend;Caring for Others;Carry;Dressing;Hygiene/Grooming;Lift;Locomotion Level;Reach Overhead;Squat;Stairs;Stand;Toileting;Transfers    Examination-Participation Restrictions Cleaning;Community Activity;Driving;Laundry;Medication Management;Meal Prep;Shop;Yard Work    [employer] Stable/Uncomplicated    Rehab Potential Good    PT Frequency 2x / week    PT Duration 12 weeks    PT Treatment/Interventions ADLs/Self Care Home Management;DME Instruction;Gait training;Stair training;Functional mobility training;Therapeutic activities;Therapeutic exercise;Balance training;Neuromuscular re-education;Patient/family education;Manual techniques    PT Next Visit Plan LE Strengthening, transfer and gait training- educate in initial HEP and issue handout    PT Home Exercise Plan Issued seated LE strengthening exercises today.    Consulted and Agree with Plan of Care Patient;Family member/caregiver    Family Member Consulted Spouse- [name]           Patient will benefit from skilled therapeutic intervention in order to improve the following deficits and impairments:  Abnormal gait,Cardiopulmonary status limiting activity,Decreased activity tolerance,Decreased balance,Decreased coordination,Decreased  endurance,Decreased knowledge of use of DME,Decreased mobility,Difficulty walking,Decreased strength,Hypomobility,Impaired perceived functional ability,Impaired flexibility  Visit Diagnosis: Repeated falls  Difficulty in walking, not elsewhere classified  Muscle weakness (generalized)     Problem List Patient Active Problem List   Diagnosis Date Noted  . Diabetes ([place]) 01/08/2016  . Hyperlipidemia 01/08/2016  . Pain in limb 01/08/2016  . Insomnia 10/27/2012  . Unspecified hereditary and idiopathic peripheral neuropathy 03/09/2012  . Lumbar stenosis 12/04/2011    Lewis Moccasin, PT 02/27/2020, 5:43 PM  [place] MAIN [facility] SERVICES [address] [place], [place], [address] Phone: [phone]   Fax:  [phone]  Name: Bacilio Rhyner Butler Jr. MRN: [id number] Date of Birth: July 09, 1945

## 2020-02-29 ENCOUNTER — Other Ambulatory Visit: Payer: Self-pay

## 2020-02-29 ENCOUNTER — Ambulatory Visit: Payer: Medicare HMO

## 2020-02-29 DIAGNOSIS — R296 Repeated falls: Secondary | ICD-10-CM | POA: Diagnosis not present

## 2020-02-29 DIAGNOSIS — M6281 Muscle weakness (generalized): Secondary | ICD-10-CM

## 2020-02-29 DIAGNOSIS — R262 Difficulty in walking, not elsewhere classified: Secondary | ICD-10-CM

## 2020-03-01 NOTE — Therapy (Signed)
[place] MAIN [facility] SERVICES [address] [place], [place], [address] Phone: [phone]   Fax:  [phone]  Physical Therapy Treatment  Patient Details  Name: [name] Aikey Ciccone Jr. MRN: [id number] Date of Birth: October 01, 1945 Referring Provider (PT): Dr. Benita Stabile   Encounter Date: 02/29/2020   PT End of Session - 02/29/20 1231    Visit Number 3    Number of Visits 25    Date for PT Re-Evaluation 05/16/20    Authorization Type Initial PT eval on 02/22/2020    PT Start Time 1433    PT Stop Time 1517    PT Time Calculation (min) 44 min    Equipment Utilized During Treatment Gait belt    Activity Tolerance No increased pain;Patient limited by fatigue    Behavior During Therapy [facility] for tasks assessed/performed           Past Medical History:  Diagnosis Date  . Arthritis   . Coronary artery disease   . Depression    STARTED AFTER  OPEN HEART SURG.  . Diabetes mellitus without complication (HCC)    Borderline  . Foot pain, bilateral   . Gout   . Hypertension   . Insomnia 10/27/2012  . Lumbosacral spinal stenosis   . Myocardial infarction ([place])   . 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 [address]  . Restless leg syndrome   . Stroke [facility])    2008  RIGHT SIDE...HAD FOR A COUPLE OF DAYS AND THEN WENT AWAY  . Stroke ([place])    [place]  2008    Past Surgical History:  Procedure Laterality Date  . BACK SURGERY    . CARDIAC CATHETERIZATION    . COLONOSCOPY WITH PROPOFOL N/A 11/10/2018   Procedure: COLONOSCOPY WITH PROPOFOL;  Surgeon: Robert Bellow, MD;  Location: [facility] ENDOSCOPY;  Service: Endoscopy;  Laterality: N/A;  . CORONARY ARTERY BYPASS GRAFT     (2010 @ [facility]  . EYE SURGERY     ?? BLEPHOPLASTY  . HAND SURGERY     CRUSHING INJURY---1966  HAS ABOUT 6 SURGERIES THEN  . HAND SURGERY Right   . HERNIA REPAIR     BILATERAL  . LUMBAR FUSION  11/25/2011   POSTERIOR    There were no vitals filed for this visit.    Subjective Assessment - 02/29/20 1229    Subjective Patietn reports feeling okay today just tired with very limited energy.    Patient is accompained by: Family member    Limitations Standing;Lifting;Walking;House hold activities    How long can you sit comfortably? No restrictions    How long can you stand comfortably? maybe a minute if I am lucky.    How long can you walk comfortably? Not far at all- maybe a 100 feet.    Patient Stated Goals Regain some strength so I can return to being as independent at possible.    Currently in Pain? No/denies    Pain Onset More than a month ago             Therex:  Patient instructed in standing LE strengthening exercises today and performed the following:  Stand march Stand hip abd Stand hip ext Stand knee flex Stand [place] Stand heel raises Stand toe raises  Patient performed 10 reps of each bilaterally requiring verbal cues and visual demo to perform correctly. Patient denied pain but required brief rest break following  each exercise. He initially performed very quickly requiring VC's and tactile cues to slow down which he did improve with practice.   Pt educated throughout session about proper posture and technique with exercises. Improved exercise technique, movement at target joints, use of target muscles after min to mod verbal, visual, tactile cues.                        PT Education - 03/01/20 1231    Education Details Exercise specific techniques    Person(s) Educated Patient    Methods Explanation;Demonstration;Tactile cues;Verbal cues;Other (comment)    Comprehension Verbalized understanding;Tactile cues required;Returned demonstration;Need further instruction;Verbal cues required            PT Short Term Goals - 02/22/20 2257      PT SHORT TERM GOAL #1   Title Pt will be independent with HEP in order to improve strength and balance in order to decrease fall risk and improve function at home and  work.    Baseline 02/22/2020- Patient presents with no formal HEP in place.    Time 6    Period Weeks    Status New    Target Date 04/04/20             PT Long Term Goals - 02/22/20 2258      PT LONG TERM GOAL #1   Title Patient will increase FOTO score to equal to or greater than 47 to demonstrate statistically significant improvement in mobility and quality of life.    Baseline 02/22/2020= 23    Time 12    Period Weeks    Status New    Target Date 05/16/20      PT LONG TERM GOAL #2   Title Pt will decrease 5TSTS by at least 3 seconds in order to demonstrate clinically significant improvement in LE strength.    Baseline 02/22/2020- 35.15 sec    Time 12    Period Weeks    Status New    Target Date 05/16/20      PT LONG TERM GOAL #3   Title Pt will decrease TUG to below 24 seconds/decrease in order to demonstrate decreased fall risk.    Baseline 02/22/2020= 31 sec using walker    Time 12    Period Weeks    Status New    Target Date 05/16/20      PT LONG TERM GOAL #4   Title Pt will increase 10MWT by at least 0.13 m/s in order to demonstrate clinically significant improvement in community ambulation.    Baseline 02/22/2020- 0.85m/s using Front wheeled walker.    Time 12    Status New    Target Date 05/16/20      PT LONG TERM GOAL #5   Title Patient will demonstrate ambulation > 500 on all surfaces using walker with modified independence without rest breaks or loss of balance for improved ability to walk community distances.    Baseline 02/22/2020-Patient limited to less than 100 feet of total amb distance using front wheeled walker.    Time 12    Period Weeks    Status New    Target Date 05/16/20                 Plan - 02/29/20 1233    Clinical Impression Statement Patient able to participate in standing LE strengthening exercises today, yet limited by fatigue requiring increased rest breaks. He will benefit from skilled physical therapy to reduce  risk of falls,  improve strength and stability, and improve independence with ADL performance    Personal Factors and Comorbidities Age;Comorbidity 1;Comorbidity 2;Comorbidity 3+    Comorbidities Hypertension, CVA, Diabetes    Examination-Activity Limitations Bathing;Bed Mobility;Bend;Caring for Others;Carry;Dressing;Hygiene/Grooming;Lift;Locomotion Level;Reach Overhead;Squat;Stairs;Stand;Toileting;Transfers    Examination-Participation Restrictions Cleaning;Community Activity;Driving;Laundry;Medication Management;Meal Prep;Shop;Yard Work    [employer] officer Stable/Uncomplicated    Rehab Potential Good    PT Frequency 2x / week    PT Duration 12 weeks    PT Treatment/Interventions ADLs/Self Care Home Management;DME Instruction;Gait training;Stair training;Functional mobility training;Therapeutic activities;Therapeutic exercise;Balance training;Neuromuscular re-education;Patient/family education;Manual techniques    PT Next Visit Plan LE Strengthening, transfer and gait training- educate in initial HEP and issue handout    PT Home Exercise Plan Issued seated LE strengthening exercises today.    Consulted and Agree with Plan of Care Patient;Family member/caregiver    Family Member Consulted Spouse- [name]           Patient will benefit from skilled therapeutic intervention in order to improve the following deficits and impairments:  Abnormal gait,Cardiopulmonary status limiting activity,Decreased activity tolerance,Decreased balance,Decreased coordination,Decreased endurance,Decreased knowledge of use of DME,Decreased mobility,Difficulty walking,Decreased strength,Hypomobility,Impaired perceived functional ability,Impaired flexibility  Visit Diagnosis: Repeated falls  Difficulty in walking, not elsewhere classified  Muscle weakness (generalized)     Problem List Patient Active Problem List   Diagnosis Date Noted  . Diabetes ([place]) 01/08/2016  . Hyperlipidemia 01/08/2016  . Pain in  limb 01/08/2016  . Insomnia 10/27/2012  . Unspecified hereditary and idiopathic peripheral neuropathy 03/09/2012  . Lumbar stenosis 12/04/2011    Lewis Moccasin, PT 03/01/2020, 1:00 PM  [place] MAIN [facility] SERVICES [address] [place], [place], [address] Phone: [phone]   Fax:  [phone]  Name: Gary Gabrielsen Beneke Jr. MRN: [id number] Date of Birth: 09-30-1945

## 2020-03-05 ENCOUNTER — Other Ambulatory Visit: Payer: Self-pay

## 2020-03-05 ENCOUNTER — Ambulatory Visit: Payer: Medicare HMO

## 2020-03-05 DIAGNOSIS — R262 Difficulty in walking, not elsewhere classified: Secondary | ICD-10-CM

## 2020-03-05 DIAGNOSIS — M6281 Muscle weakness (generalized): Secondary | ICD-10-CM

## 2020-03-05 DIAGNOSIS — R296 Repeated falls: Secondary | ICD-10-CM | POA: Diagnosis not present

## 2020-03-05 NOTE — Therapy (Signed)
[place] MAIN [facility] SERVICES [address] [place], [place], [address] Phone: [phone]   Fax:  [phone]  Physical Therapy Treatment  Patient Details  Name: [name] Kersh Issac Jr. MRN: [id number] Date of Birth: [birth year] Referring Provider (PT): Dr. Benita Stabile   Encounter Date: 03/05/2020   PT End of Session - 03/05/20 1150    Visit Number 4    Number of Visits 25    Date for PT Re-Evaluation 05/16/20    Authorization Type Initial PT eval on 02/22/2020    PT Start Time 1145    PT Stop Time 1225    PT Time Calculation (min) 40 min    Equipment Utilized During Treatment Gait belt    Activity Tolerance No increased pain;Patient limited by fatigue    Behavior During Therapy [facility] for tasks assessed/performed           Past Medical History:  Diagnosis Date  . Arthritis   . Coronary artery disease   . Depression    STARTED AFTER  OPEN HEART SURG.  . Diabetes mellitus without complication (HCC)    Borderline  . Foot pain, bilateral   . Gout   . Hypertension   . Insomnia 10/27/2012  . Lumbosacral spinal stenosis   . Myocardial infarction ([place])   . 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 [address]  . Restless leg syndrome   . Stroke [facility])    2008  RIGHT SIDE...HAD FOR A COUPLE OF DAYS AND THEN WENT AWAY  . Stroke ([place])    [place]  2008    Past Surgical History:  Procedure Laterality Date  . BACK SURGERY    . CARDIAC CATHETERIZATION    . COLONOSCOPY WITH PROPOFOL N/A 11/10/2018   Procedure: COLONOSCOPY WITH PROPOFOL;  Surgeon: Robert Bellow, MD;  Location: [facility] ENDOSCOPY;  Service: Endoscopy;  Laterality: N/A;  . CORONARY ARTERY BYPASS GRAFT     (2010 @ [facility]  . EYE SURGERY     ?? BLEPHOPLASTY  . HAND SURGERY     CRUSHING INJURY---1966  HAS ABOUT 6 SURGERIES THEN  . HAND SURGERY Right   . HERNIA REPAIR     BILATERAL  . LUMBAR FUSION  11/25/2011   POSTERIOR    There were no vitals filed for this visit.    Subjective Assessment - 03/05/20 1149    Subjective Patietn reports no new complaints today    Patient is accompained by: Family member    Limitations Standing;Lifting;Walking;House hold activities    How long can you sit comfortably? No restrictions    How long can you stand comfortably? maybe a minute if I am lucky.    How long can you walk comfortably? Not far at all- maybe a 100 feet.    Patient Stated Goals Regain some strength so I can return to being as independent at possible.    Currently in Pain? No/denies    Pain Onset More than a month ago              Therex:  Patient performed standing LE strengthening exercises today and performed the following using 2lb ankle weight.   Stand march Stand hip abd Stand hip ext Stand knee flex Stand [place] Stand heel raises Stand toe raises  Patient performed 8-10 reps of each bilaterally with verbal cues and visual demo to perform correctly. Patient requested rest break after performing 2 back to  back exercises but able to stand for longer duration prior to rest    Pt educated throughout session about proper posture and technique with exercises. Improved exercise technique, movement at target joints, use of target muscles after min to mod verbal, visual, tactile cues.  Gait training: Patient ambulated approx 160 feet today with front wheeled walker, CGA, with use of gait belt - initial cues to stay within the walker but patient performed well with good posture and short reciprocal steps- limited only by fatigue today.                                  PT Education - 03/05/20 1555    Education Details specific exercise technique and benefit from resistive training including ankle weights.    Person(s) Educated Patient    Methods Explanation;Demonstration;Tactile cues;Verbal cues    Comprehension Verbalized understanding;Returned demonstration;Tactile cues required;Need further instruction             PT Short Term Goals - 02/22/20 2257      PT SHORT TERM GOAL #1   Title Pt will be independent with HEP in order to improve strength and balance in order to decrease fall risk and improve function at home and work.    Baseline 02/22/2020- Patient presents with no formal HEP in place.    Time 6    Period Weeks    Status New    Target Date 04/04/20             PT Long Term Goals - 02/22/20 2258      PT LONG TERM GOAL #1   Title Patient will increase FOTO score to equal to or greater than 47 to demonstrate statistically significant improvement in mobility and quality of life.    Baseline 02/22/2020= 23    Time 12    Period Weeks    Status New    Target Date 05/16/20      PT LONG TERM GOAL #2   Title Pt will decrease 5TSTS by at least 3 seconds in order to demonstrate clinically significant improvement in LE strength.    Baseline 02/22/2020- 35.15 sec    Time 12    Period Weeks    Status New    Target Date 05/16/20      PT LONG TERM GOAL #3   Title Pt will decrease TUG to below 24 seconds/decrease in order to demonstrate decreased fall risk.    Baseline 02/22/2020= 31 sec using walker    Time 12    Period Weeks    Status New    Target Date 05/16/20      PT LONG TERM GOAL #4   Title Pt will increase 10MWT by at least 0.13 m/s in order to demonstrate clinically significant improvement in community ambulation.    Baseline 02/22/2020- 0.67m/s using Front wheeled walker.    Time 12    Status New    Target Date 05/16/20      PT LONG TERM GOAL #5   Title Patient will demonstrate ambulation > 500 on all surfaces using walker with modified independence without rest breaks or loss of balance for improved ability to walk community distances.    Baseline 02/22/2020-Patient limited to less than 100 feet of total amb distance using front wheeled walker.    Time 12    Period Weeks    Status New    Target Date 05/16/20  Plan - 03/05/20 1556    Clinical  Impression Statement Patient was able to progress to resistive strengthening using 2lb. ankle weight today without significant difficulty and also presented with increased gait endurance without significant abnormalities today using his front wheeled walker. He will continue to benefit from skilled PT services to improve his overall strength and balance for optimal functional mobility in the home and community.    Personal Factors and Comorbidities Age;Comorbidity 1;Comorbidity 2;Comorbidity 3+    Comorbidities Hypertension, CVA, Diabetes    Examination-Activity Limitations Bathing;Bed Mobility;Bend;Caring for Others;Carry;Dressing;Hygiene/Grooming;Lift;Locomotion Level;Reach Overhead;Squat;Stairs;Stand;Toileting;Transfers    Examination-Participation Restrictions Cleaning;Community Activity;Driving;Laundry;Medication Management;Meal Prep;Shop;Yard Work    [employer] officer Stable/Uncomplicated    Rehab Potential Good    PT Frequency 2x / week    PT Duration 12 weeks    PT Treatment/Interventions ADLs/Self Care Home Management;DME Instruction;Gait training;Stair training;Functional mobility training;Therapeutic activities;Therapeutic exercise;Balance training;Neuromuscular re-education;Patient/family education;Manual techniques    PT Next Visit Plan LE Strengthening, transfer and gait training- educate in initial HEP and issue handout    PT Home Exercise Plan Verbally reviewed seated LE strengthening exercises today.    Consulted and Agree with Plan of Care Patient;Family member/caregiver    Family Member Consulted Spouse- [name]           Patient will benefit from skilled therapeutic intervention in order to improve the following deficits and impairments:  Abnormal gait,Cardiopulmonary status limiting activity,Decreased activity tolerance,Decreased balance,Decreased coordination,Decreased endurance,Decreased knowledge of use of DME,Decreased mobility,Difficulty walking,Decreased  strength,Hypomobility,Impaired perceived functional ability,Impaired flexibility  Visit Diagnosis: Repeated falls  Difficulty in walking, not elsewhere classified  Muscle weakness (generalized)     Problem List Patient Active Problem List   Diagnosis Date Noted  . Diabetes ([place]) 01/08/2016  . Hyperlipidemia 01/08/2016  . Pain in limb 01/08/2016  . Insomnia 10/27/2012  . Unspecified hereditary and idiopathic peripheral neuropathy 03/09/2012  . Lumbar stenosis 12/04/2011    Lewis Moccasin, PT 03/05/2020, 4:11 PM  [place] MAIN [facility] SERVICES [address] [place], [place], [address] Phone: [phone]   Fax:  [phone]  Name: [name] Kurtzman Hurlbut Jr. MRN: [id number] Date of Birth: 1945-06-21

## 2020-03-07 ENCOUNTER — Ambulatory Visit: Payer: Medicare HMO

## 2020-03-07 ENCOUNTER — Other Ambulatory Visit: Payer: Self-pay

## 2020-03-07 DIAGNOSIS — R296 Repeated falls: Secondary | ICD-10-CM | POA: Diagnosis not present

## 2020-03-07 DIAGNOSIS — M6281 Muscle weakness (generalized): Secondary | ICD-10-CM

## 2020-03-07 DIAGNOSIS — R262 Difficulty in walking, not elsewhere classified: Secondary | ICD-10-CM

## 2020-03-07 NOTE — Therapy (Signed)
[place] MAIN [facility] SERVICES [address] [place], [place], [address] Phone: [phone]   Fax:  [phone]  Physical Therapy Treatment  Patient Details  Name: [name] Hert Spellman Jr. MRN: [id number] Date of Birth: 1945-11-09 Referring Provider (PT): Dr. Benita Stabile   Encounter Date: 03/07/2020   PT End of Session - 03/07/20 1605    Visit Number 5    Number of Visits 25    Date for PT Re-Evaluation 05/16/20    Authorization Type Initial PT eval on 02/22/2020    PT Start Time 1146    PT Stop Time 1229    PT Time Calculation (min) 43 min    Equipment Utilized During Treatment Gait belt    Activity Tolerance No increased pain;Patient limited by fatigue    Behavior During Therapy [facility] for tasks assessed/performed           Past Medical History:  Diagnosis Date  . Arthritis   . Coronary artery disease   . Depression    STARTED AFTER  OPEN HEART SURG.  . Diabetes mellitus without complication (HCC)    Borderline  . Foot pain, bilateral   . Gout   . Hypertension   . Insomnia 10/27/2012  . Lumbosacral spinal stenosis   . Myocardial infarction ([place])   . 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 [address]  . Restless leg syndrome   . Stroke [facility])    2008  RIGHT SIDE...HAD FOR A COUPLE OF DAYS AND THEN WENT AWAY  . Stroke ([place])    [place]  2008    Past Surgical History:  Procedure Laterality Date  . BACK SURGERY    . CARDIAC CATHETERIZATION    . COLONOSCOPY WITH PROPOFOL N/A 11/10/2018   Procedure: COLONOSCOPY WITH PROPOFOL;  Surgeon: Robert Bellow, MD;  Location: [facility] ENDOSCOPY;  Service: Endoscopy;  Laterality: N/A;  . CORONARY ARTERY BYPASS GRAFT     (2010 @ [facility]  . EYE SURGERY     ?? BLEPHOPLASTY  . HAND SURGERY     CRUSHING INJURY---1966  HAS ABOUT 6 SURGERIES THEN  . HAND SURGERY Right   . HERNIA REPAIR     BILATERAL  . LUMBAR FUSION  11/25/2011   POSTERIOR    There were no vitals filed for this visit.    Subjective Assessment - 03/07/20 1604    Subjective Patietn reports feeling tired today but no other issues.    Patient is accompained by: Family member    Limitations Standing;Lifting;Walking;House hold activities    How long can you sit comfortably? No restrictions    How long can you stand comfortably? maybe a minute if I am lucky.    How long can you walk comfortably? Not far at all- maybe a 100 feet.    Patient Stated Goals Regain some strength so I can return to being as independent at possible.    Currently in Pain? No/denies    Pain Onset More than a month ago               Attempted Leg press at 40# but patient reported inability to position legs comfortably and stopped due to low back discomfort.   Patient ambulated around 140 feet with front wheeled walker  Sit to stand x 7  without UE support  Step tapping on 6" step without UE support x 10 reps  Side step over orange hurdle (2 )  x 6 trials left to right then right to left  Dynamic step tapping onto cones (calling out right LE or Left LE and corresponding colored cone- ex. Left green and patient able to perform approx 3 min of activity with min difficulty with increased unsteadiness requiring intermittent fingertip touch for balance   Nustep LE only - initially intensity level 0 and seat at 9 for 2:30 and then another 2:30 at intensity level 2 for total of 0.17 miles (verbal cues to keep SPM >50)  Pt educated throughout session about proper posture and technique with exercises. Improved exercise technique, movement at target joints, use of target muscles after min to mod verbal, visual, tactile cues.                       PT Education - 03/07/20 1604    Education Details Specific strengthening exercise and balance activity training.    Person(s) Educated Patient    Methods Explanation;Demonstration;Tactile cues;Verbal cues    Comprehension Verbalized understanding;Tactile cues required;Returned  demonstration;Need further instruction;Verbal cues required            PT Short Term Goals - 02/22/20 2257      PT SHORT TERM GOAL #1   Title Pt will be independent with HEP in order to improve strength and balance in order to decrease fall risk and improve function at home and work.    Baseline 02/22/2020- Patient presents with no formal HEP in place.    Time 6    Period Weeks    Status New    Target Date 04/04/20             PT Long Term Goals - 02/22/20 2258      PT LONG TERM GOAL #1   Title Patient will increase FOTO score to equal to or greater than 47 to demonstrate statistically significant improvement in mobility and quality of life.    Baseline 02/22/2020= 23    Time 12    Period Weeks    Status New    Target Date 05/16/20      PT LONG TERM GOAL #2   Title Pt will decrease 5TSTS by at least 3 seconds in order to demonstrate clinically significant improvement in LE strength.    Baseline 02/22/2020- 35.15 sec    Time 12    Period Weeks    Status New    Target Date 05/16/20      PT LONG TERM GOAL #3   Title Pt will decrease TUG to below 24 seconds/decrease in order to demonstrate decreased fall risk.    Baseline 02/22/2020= 31 sec using walker    Time 12    Period Weeks    Status New    Target Date 05/16/20      PT LONG TERM GOAL #4   Title Pt will increase 10MWT by at least 0.13 m/s in order to demonstrate clinically significant improvement in community ambulation.    Baseline 02/22/2020- 0.4m/s using Front wheeled walker.    Time 12    Status New    Target Date 05/16/20      PT LONG TERM GOAL #5   Title Patient will demonstrate ambulation > 500 on all surfaces using walker with modified independence without rest breaks or loss of balance for improved ability to walk community distances.    Baseline 02/22/2020-Patient limited to less than 100 feet of total amb distance using front wheeled walker.    Time 12  Period Weeks    Status New    Target Date 05/16/20                  Plan - 03/07/20 1606    Clinical Impression Statement Patient initally challenged with attempted leg press and unable to perform due to discomfot yet no issues on Nustep and able to focus on quad strengthening and balance activities. He was able to begin some balance activities without significant difficulty other than some unsteadiness as well as demo improved overall functional standing endurance. He will continue to benefit from skilled PT services to improve his overall strength and balance for optimal functional mobility in the home and community.    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    Consulted and Agree with Plan of Care Patient;Family member/caregiver    Family Member Consulted Spouse- [name]           Patient will benefit from skilled therapeutic intervention in order to improve the following deficits and impairments:  Abnormal gait,Cardiopulmonary status limiting activity,Decreased activity tolerance,Decreased balance,Decreased coordination,Decreased  endurance,Decreased knowledge of use of DME,Decreased mobility,Difficulty walking,Decreased strength,Hypomobility,Impaired perceived functional ability,Impaired flexibility  Visit Diagnosis: Repeated falls  Difficulty in walking, not elsewhere classified  Muscle weakness (generalized)     Problem List Patient Active Problem List   Diagnosis Date Noted  . Diabetes ([place]) 01/08/2016  . Hyperlipidemia 01/08/2016  . Pain in limb 01/08/2016  . Insomnia 10/27/2012  . Unspecified hereditary and idiopathic peripheral neuropathy 03/09/2012  . Lumbar stenosis 12/04/2011    Lewis Moccasin, PT 03/07/2020, 4:11 PM  [place] MAIN [facility] SERVICES [address] [place], [place], [address] Phone: [phone]   Fax:  [phone]  Name: [name] Wuertz Bram Jr. MRN: [id number] Date of Birth: Oct 30, 1945

## 2020-03-12 ENCOUNTER — Other Ambulatory Visit: Payer: Self-pay

## 2020-03-12 ENCOUNTER — Ambulatory Visit: Payer: Medicare HMO

## 2020-03-12 DIAGNOSIS — R262 Difficulty in walking, not elsewhere classified: Secondary | ICD-10-CM

## 2020-03-12 DIAGNOSIS — M6281 Muscle weakness (generalized): Secondary | ICD-10-CM

## 2020-03-12 DIAGNOSIS — R296 Repeated falls: Secondary | ICD-10-CM

## 2020-03-12 NOTE — Therapy (Signed)
[place] MAIN [facility] SERVICES [address] [place], [place], [address] Phone: [phone]   Fax:  [phone]  Physical Therapy Treatment  Patient Details  Name: [name] Blasius Loss Jr. MRN: [id number] Date of Birth: April 30, 1945 Referring Provider (PT): Dr. Benita Stabile   Encounter Date: 03/12/2020   PT End of Session - 03/12/20 1254    Visit Number 6    Number of Visits 25    Date for PT Re-Evaluation 05/16/20    Authorization Type Initial PT eval on 02/22/2020    PT Start Time 1147    PT Stop Time 1228    PT Time Calculation (min) 41 min    Equipment Utilized During Treatment Gait belt    Activity Tolerance No increased pain;Patient limited by fatigue    Behavior During Therapy [facility] for tasks assessed/performed           Past Medical History:  Diagnosis Date  . Arthritis   . Coronary artery disease   . Depression    STARTED AFTER  OPEN HEART SURG.  . Diabetes mellitus without complication (HCC)    Borderline  . Foot pain, bilateral   . Gout   . Hypertension   . Insomnia 10/27/2012  . Lumbosacral spinal stenosis   . Myocardial infarction ([place])   . 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 L[address]ta [address]  . Restless leg syndrome   . Stroke [facility])    2008  RIGHT SIDE...HAD FOR A COUPLE OF DAYS AND THEN WENT AWAY  . Stroke ([place])    [place]  2008    Past Surgical History:  Procedure Laterality Date  . BACK SURGERY    . CARDIAC CATHETERIZATION    . COLONOSCOPY WITH PROPOFOL N/A 11/10/2018   Procedure: COLONOSCOPY WITH PROPOFOL;  Surgeon: Robert Bellow, MD;  Location: [facility] ENDOSCOPY;  Service: Endoscopy;  Laterality: N/A;  . CORONARY ARTERY BYPASS GRAFT     (2010 @ [facility]  . EYE SURGERY     ?? BLEPHOPLASTY  . HAND SURGERY     CRUSHING INJURY---1966  HAS ABOUT 6 SURGERIES THEN  . HAND SURGERY Right   . HERNIA REPAIR     BILATERAL  . LUMBAR FUSION  11/25/2011   POSTERIOR    There were no vitals filed for this visit.    Subjective Assessment - 03/12/20 1155    Subjective Patietn reports feeling okay. He reports he is up and moving and was having some balance issues this morning- falling backward - able to self correct with UE support.    Patient is accompained by: Family member    Limitations Standing;Lifting;Walking;House hold activities    How long can you sit comfortably? No restrictions    How long can you stand comfortably? maybe a minute if I am lucky.    How long can you walk comfortably? Not far at all- maybe a 100 feet.    Patient Stated Goals Regain some strength so I can return to being as independent at possible.    Currently in Pain? No/denies    Pain Onset More than a month ago             Patient ambulated around 280 feet with front wheeled walker- CGA and VC for step length and erect posture- patient reports   Sit to stand x with UE support from armrest of chair x 10 - Increase difficulty today requiring bilateral UE  support and cues to scoot out to edge of chair and lean forward. Patient fatigued around rep 6 requiring increased UE support due to weakness.  Dynamic Standing x 5 min - Basketball nerf shooting into basket - Patient with mild unsteadiness and 1 loss of balance requiring min Assist to recover.    Nustep LE only -L2  For total distance  of 0.17 miles (verbal cues to keep SPM >50)  Pt educated throughout session about proper posture and technique with exercises. Improved exercise technique, movement at target joints, use of target muscles after min to mod verbal, visual, tactile cues.                           PT Education - 03/12/20 1253    Education Details specific exercise cues and use of the modified BORG RPE scale.    Person(s) Educated Patient    Methods Explanation;Demonstration;Tactile cues;Verbal cues    Comprehension Verbalized understanding;Returned demonstration;Tactile cues required;Need further instruction;Verbal cues required             PT Short Term Goals - 02/22/20 2257      PT SHORT TERM GOAL #1   Title Pt will be independent with HEP in order to improve strength and balance in order to decrease fall risk and improve function at home and work.    Baseline 02/22/2020- Patient presents with no formal HEP in place.    Time 6    Period Weeks    Status New    Target Date 04/04/20             PT Long Term Goals - 02/22/20 2258      PT LONG TERM GOAL #1   Title Patient will increase FOTO score to equal to or greater than 47 to demonstrate statistically significant improvement in mobility and quality of life.    Baseline 02/22/2020= 23    Time 12    Period Weeks    Status New    Target Date 05/16/20      PT LONG TERM GOAL #2   Title Pt will decrease 5TSTS by at least 3 seconds in order to demonstrate clinically significant improvement in LE strength.    Baseline 02/22/2020- 35.15 sec    Time 12    Period Weeks    Status New    Target Date 05/16/20      PT LONG TERM GOAL #3   Title Pt will decrease TUG to below 24 seconds/decrease in order to demonstrate decreased fall risk.    Baseline 02/22/2020= 31 sec using walker    Time 12    Period Weeks    Status New    Target Date 05/16/20      PT LONG TERM GOAL #4   Title Pt will increase 10MWT by at least 0.13 m/s in order to demonstrate clinically significant improvement in community ambulation.    Baseline 02/22/2020- 0.59m/s using Front wheeled walker.    Time 12    Status New    Target Date 05/16/20      PT LONG TERM GOAL #5   Title Patient will demonstrate ambulation > 500 on all surfaces using walker with modified independence without rest breaks or loss of balance for improved ability to walk community distances.    Baseline 02/22/2020-Patient limited to less than 100 feet of total amb distance using front wheeled walker.    Time 12    Period Weeks  Status New    Target Date 05/16/20                 Plan - 03/12/20 1255    Clinical  Impression Statement Patient continues to exhibit weakness and fatigue with therapeutic interventions today. He presented with increased difficulty with sit to stand transfers and some unsteadiness with dynamic balance activiites with 1 loss of balance requiring physical assist to keep from falling. He will continue to benefit from skilled PT services to improve his overall strength and balance for optimal functional mobility in the home and community.    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    Consulted and Agree with Plan of Care Patient;Family member/caregiver    Family Member Consulted Spouse- [name]           Patient will benefit from skilled therapeutic intervention in order to improve the following deficits and impairments:  Abnormal gait,Cardiopulmonary status limiting activity,Decreased activity tolerance,Decreased balance,Decreased coordination,Decreased endurance,Decreased knowledge of use of DME,Decreased mobility,Difficulty walking,Decreased strength,Hypomobility,Impaired perceived  functional ability,Impaired flexibility  Visit Diagnosis: Repeated falls  Difficulty in walking, not elsewhere classified  Muscle weakness (generalized)     Problem List Patient Active Problem List   Diagnosis Date Noted  . Diabetes ([place]) 01/08/2016  . Hyperlipidemia 01/08/2016  . Pain in limb 01/08/2016  . Insomnia 10/27/2012  . Unspecified hereditary and idiopathic peripheral neuropathy 03/09/2012  . Lumbar stenosis 12/04/2011    Lewis Moccasin, PT 03/13/2020, 6:32 AM  [place] MAIN [facility] SERVICES [address] [place], [place], [address] Phone: [phone]   Fax:  [phone]  Name: [name] Weninger Vangieson Jr. MRN: [id number] Date of Birth: [birth year]

## 2020-03-14 ENCOUNTER — Other Ambulatory Visit: Payer: Self-pay

## 2020-03-14 ENCOUNTER — Ambulatory Visit: Payer: Medicare HMO

## 2020-03-14 DIAGNOSIS — R262 Difficulty in walking, not elsewhere classified: Secondary | ICD-10-CM

## 2020-03-14 DIAGNOSIS — M6281 Muscle weakness (generalized): Secondary | ICD-10-CM

## 2020-03-14 DIAGNOSIS — R296 Repeated falls: Secondary | ICD-10-CM

## 2020-03-14 NOTE — Therapy (Signed)
[place] MAIN [facility] SERVICES [address] [place], [place], [address] Phone: [phone]   Fax:  [phone]  Physical Therapy Treatment  Patient Details  Name: [name] Fullbright Tant Jr. MRN: [id number] Date of Birth: Apr 02, 1945 Referring Provider (PT): Dr. Benita Stabile   Encounter Date: 03/14/2020   PT End of Session - 03/14/20 1613    Visit Number 7    Number of Visits 25    Date for PT Re-Evaluation 05/16/20    Authorization Type Initial PT eval on 02/22/2020    PT Start Time 1139    PT Stop Time 1210    PT Time Calculation (min) 31 min    Equipment Utilized During Treatment Gait belt    Activity Tolerance No increased pain;Patient limited by fatigue    Behavior During Therapy [facility] for tasks assessed/performed           Past Medical History:  Diagnosis Date  . Arthritis   . Coronary artery disease   . Depression    STARTED AFTER  OPEN HEART SURG.  . Diabetes mellitus without complication (HCC)    Borderline  . Foot pain, bilateral   . Gout   . Hypertension   . Insomnia 10/27/2012  . Lumbosacral spinal stenosis   . Myocardial infarction ([place])   . 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 Loreta [address]  . Restless leg syndrome   . Stroke [facility])    2008  RIGHT SIDE...HAD FOR A COUPLE OF DAYS AND THEN WENT AWAY  . Stroke ([place])    [place]  2008    Past Surgical History:  Procedure Laterality Date  . BACK SURGERY    . CARDIAC CATHETERIZATION    . COLONOSCOPY WITH PROPOFOL N/A 11/10/2018   Procedure: COLONOSCOPY WITH PROPOFOL;  Surgeon: Robert Bellow, MD;  Location: [facility] ENDOSCOPY;  Service: Endoscopy;  Laterality: N/A;  . CORONARY ARTERY BYPASS GRAFT     (2010 @ [facility]  . EYE SURGERY     ?? BLEPHOPLASTY  . HAND SURGERY     CRUSHING INJURY---1966  HAS ABOUT 6 SURGERIES THEN  . HAND SURGERY Right   . HERNIA REPAIR     BILATERAL  . LUMBAR FUSION  11/25/2011   POSTERIOR    There were no vitals filed for this visit.    Subjective Assessment - 03/14/20 1148    Subjective Patient reports just feeling tired. He denies any pain or other issues except lack of energy.    Patient is accompained by: Family member    Limitations Standing;Lifting;Walking;House hold activities    How long can you sit comfortably? No restrictions    How long can you stand comfortably? maybe a minute if I am lucky.    How long can you walk comfortably? Not far at all- maybe a 100 feet.    Patient Stated Goals Regain some strength so I can return to being as independent at possible.    Currently in Pain? No/denies    Pain Onset More than a month ago               Nustep LE only -Level 3 for 2:30 and L4 for 2:30  For total distance  of 0.14 miles (verbal cues to keep SPM >50). Patient reported 7/10 on Modified BORG RPE scale.   Step ups (as patient reports some difficulty negotiating 2 steps at [place] and has no railings) - performed  4 steps x 4 trials today without UE with CGA and rated at 4/10. He exhibited mild unsteadiness yet no loss of balance.   Forward/backward/lateral step over orange hurdle x 20 reps each direction. - initially required [place] on bar yet able to progress to no UE support.    minilunge squat x  10 reps each LE (requiring VC and visual demo to perform correctly) Patient exhibited undo fatigue after completing steps and squats and stated he was very fatigued.                         PT Short Term Goals - 02/22/20 2257      PT SHORT TERM GOAL #1   Title Pt will be independent with HEP in order to improve strength and balance in order to decrease fall risk and improve function at home and work.    Baseline 02/22/2020- Patient presents with no formal HEP in place.    Time 6    Period Weeks    Status New    Target Date 04/04/20             PT Long Term Goals - 02/22/20 2258      PT LONG TERM GOAL #1   Title Patient will increase FOTO score to equal to or greater than 47 to  demonstrate statistically significant improvement in mobility and quality of life.    Baseline 02/22/2020= 23    Time 12    Period Weeks    Status New    Target Date 05/16/20      PT LONG TERM GOAL #2   Title Pt will decrease 5TSTS by at least 3 seconds in order to demonstrate clinically significant improvement in LE strength.    Baseline 02/22/2020- 35.15 sec    Time 12    Period Weeks    Status New    Target Date 05/16/20      PT LONG TERM GOAL #3   Title Pt will decrease TUG to below 24 seconds/decrease in order to demonstrate decreased fall risk.    Baseline 02/22/2020= 31 sec using walker    Time 12    Period Weeks    Status New    Target Date 05/16/20      PT LONG TERM GOAL #4   Title Pt will increase 10MWT by at least 0.13 m/s in order to demonstrate clinically significant improvement in community ambulation.    Baseline 02/22/2020- 0.56m/s using Front wheeled walker.    Time 12    Status New    Target Date 05/16/20      PT LONG TERM GOAL #5   Title Patient will demonstrate ambulation > 500 on all surfaces using walker with modified independence without rest breaks or loss of balance for improved ability to walk community distances.    Baseline 02/22/2020-Patient limited to less than 100 feet of total amb distance using front wheeled walker.    Time 12    Period Weeks    Status New    Target Date 05/16/20                 Plan - 03/14/20 1614    Clinical Impression Statement Patient able to perform Nustep today with increased resistance and able to progress to performing steps without UE support today however demonstrated increased overall fatigue with these activities. He remains limited with his functional endurance and will continue to benefit from skilled PT services to improve his  overall strength, functional endurance,  and balance for optimal functional mobility in the home and community and improved quality of life.    Personal Factors and Comorbidities  Age;Comorbidity 1;Comorbidity 2;Comorbidity 3+    Comorbidities Hypertension, CVA, Diabetes    Examination-Activity Limitations Bathing;Bed Mobility;Bend;Caring for Others;Carry;Dressing;Hygiene/Grooming;Lift;Locomotion Level;Reach Overhead;Squat;Stairs;Stand;Toileting;Transfers    Examination-Participation Restrictions Cleaning;Community Activity;Driving;Laundry;Medication Management;Meal Prep;Shop;Yard Work    [employer] officer Stable/Uncomplicated    Rehab Potential Good    PT Frequency 2x / week    PT Duration 12 weeks    PT Treatment/Interventions ADLs/Self Care Home Management;DME Instruction;Gait training;Stair training;Functional mobility training;Therapeutic activities;Therapeutic exercise;Balance training;Neuromuscular re-education;Patient/family education;Manual techniques    PT Next Visit Plan Continue with progressive strengthening and progress balance activities as appropriate.    Consulted and Agree with Plan of Care Patient;Family member/caregiver    Family Member Consulted Spouse- [name]           Patient will benefit from skilled therapeutic intervention in order to improve the following deficits and impairments:  Abnormal gait,Cardiopulmonary status limiting activity,Decreased activity tolerance,Decreased balance,Decreased coordination,Decreased endurance,Decreased knowledge of use of DME,Decreased mobility,Difficulty walking,Decreased strength,Hypomobility,Impaired perceived functional ability,Impaired flexibility  Visit Diagnosis: Repeated falls  Difficulty in walking, not elsewhere classified  Muscle weakness (generalized)     Problem List Patient Active Problem List   Diagnosis Date Noted  . Diabetes ([place]) 01/08/2016  . Hyperlipidemia 01/08/2016  . Pain in limb 01/08/2016  . Insomnia 10/27/2012  . Unspecified hereditary and idiopathic peripheral neuropathy 03/09/2012  . Lumbar stenosis 12/04/2011    Lewis Moccasin, PT 03/14/2020,  4:32 PM  [place] MAIN [facility] SERVICES [address] [place], [place], [address] Phone: [phone]   Fax:  [phone]  Name: Viaan Knippenberg [place] Jr. MRN: [id number] Date of Birth: Gaunce 03, 1947

## 2020-03-19 ENCOUNTER — Other Ambulatory Visit: Payer: Self-pay

## 2020-03-19 ENCOUNTER — Ambulatory Visit: Payer: Medicare HMO

## 2020-03-19 DIAGNOSIS — M6281 Muscle weakness (generalized): Secondary | ICD-10-CM

## 2020-03-19 DIAGNOSIS — R296 Repeated falls: Secondary | ICD-10-CM

## 2020-03-19 DIAGNOSIS — R262 Difficulty in walking, not elsewhere classified: Secondary | ICD-10-CM

## 2020-03-19 NOTE — Therapy (Signed)
[place] MAIN [facility] SERVICES [address] [place], [place], [address] Phone: [phone]   Fax:  [phone]  Physical Therapy Treatment  Patient Details  Name: [name] Littler Rodas Jr. MRN: [id number] Date of Birth: 1945/11/13 Referring Provider (PT): Dr. Benita Stabile   Encounter Date: 03/19/2020   PT End of Session - 03/19/20 2022    Visit Number 8    Number of Visits 25    Date for PT Re-Evaluation 05/16/20    Authorization Type Initial PT eval on 02/22/2020    PT Start Time 1101    PT Stop Time 1143    PT Time Calculation (min) 42 min    Equipment Utilized During Treatment Gait belt    Activity Tolerance No increased pain;Patient limited by fatigue    Behavior During Therapy [facility] for tasks assessed/performed           Past Medical History:  Diagnosis Date  . Arthritis   . Coronary artery disease   . Depression    STARTED AFTER  OPEN HEART SURG.  . Diabetes mellitus without complication (HCC)    Borderline  . Foot pain, bilateral   . Gout   . Hypertension   . Insomnia 10/27/2012  . Lumbosacral spinal stenosis   . Myocardial infarction ([place])   . 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 L[address]ta [address]  . Restless leg syndrome   . Stroke [facility])    2008  RIGHT SIDE...HAD FOR A COUPLE OF DAYS AND THEN WENT AWAY  . Stroke ([place])    [place]  2008    Past Surgical History:  Procedure Laterality Date  . BACK SURGERY    . CARDIAC CATHETERIZATION    . COLONOSCOPY WITH PROPOFOL N/A 11/10/2018   Procedure: COLONOSCOPY WITH PROPOFOL;  Surgeon: Robert Bellow, MD;  Location: [facility] ENDOSCOPY;  Service: Endoscopy;  Laterality: N/A;  . CORONARY ARTERY BYPASS GRAFT     (2010 @ [facility]  . EYE SURGERY     ?? BLEPHOPLASTY  . HAND SURGERY     CRUSHING INJURY---1966  HAS ABOUT 6 SURGERIES THEN  . HAND SURGERY Right   . HERNIA REPAIR     BILATERAL  . LUMBAR FUSION  11/25/2011   POSTERIOR    There were no vitals filed for this visit.    Subjective Assessment - 03/19/20 1114    Subjective Patient reports just feeling tired. He denies any pain or other issues except lack of energy.    Patient is accompained by: Family member    Limitations Standing;Lifting;Walking;House hold activities    How long can you sit comfortably? No restrictions    How long can you stand comfortably? maybe a minute if I am lucky.    How long can you walk comfortably? Not far at all- maybe a 100 feet.    Patient Stated Goals Regain some strength so I can return to being as independent at possible.    Currently in Pain? No/denies    Pain Onset More than a month ago             Patient instructed in the following exercises today.   Standing LE strengthening using 3# BLE:  Stand hip march Stand Hip ext Stand Hip Abd Stand knee flex Stand Calf raises Stand toe raises   10 reps each leg with VC for correct form and patient performed fair- no reported pain- only fatigue as  limiting factor.   Seated LE strengthening with 3# BLE:   Seated hip march Seated hip abd Seated calf/toe raises Seated Knee ext 10 reps each leg with mini seated rest break.     COPY TEXT FROM ISSUED HEP TODAY-Issued today and provided to patient and wife- Patient performed well with 3lb weight today and wife ordered ankle weight online during visit so patient would be able to progressive the resistive portion of his HEP.   Access Code: [email or web address] URL: [email or web address] Date: 03/19/2020 Prepared by: Sande Brothers  Exercises Standing March with Counter Support - 1 x daily - 5 x weekly - 3 sets - 10 reps Standing Hip Abduction with Counter Support - 1 x daily - 5 x weekly - 3 sets - 10 reps Standing Hip Extension with Counter Support - 1 x daily - 5 x weekly - 3 sets - 10 reps Standing Knee Flexion with Counter Support - 1 x daily - 5 x weekly - 3 sets - 10 reps Standing Heel Raises - 1 x daily - 5 x weekly - 3 sets - 10  reps                  PT Education - 03/19/20 2021    Education Details Home program - standing LE strengthening technique- issued handout    Person(s) Educated Patient    Methods Explanation;Demonstration;Tactile cues;Verbal cues;Handout    Comprehension Verbalized understanding;Returned demonstration;Tactile cues required;Need further instruction;Verbal cues required            PT Short Term Goals - 02/22/20 2257      PT SHORT TERM GOAL #1   Title Pt will be independent with HEP in order to improve strength and balance in order to decrease fall risk and improve function at home and work.    Baseline 02/22/2020- Patient presents with no formal HEP in place.    Time 6    Period Weeks    Status New    Target Date 04/04/20             PT Long Term Goals - 02/22/20 2258      PT LONG TERM GOAL #1   Title Patient will increase FOTO score to equal to or greater than 47 to demonstrate statistically significant improvement in mobility and quality of life.    Baseline 02/22/2020= 23    Time 12    Period Weeks    Status New    Target Date 05/16/20      PT LONG TERM GOAL #2   Title Pt will decrease 5TSTS by at least 3 seconds in order to demonstrate clinically significant improvement in LE strength.    Baseline 02/22/2020- 35.15 sec    Time 12    Period Weeks    Status New    Target Date 05/16/20      PT LONG TERM GOAL #3   Title Pt will decrease TUG to below 24 seconds/decrease in order to demonstrate decreased fall risk.    Baseline 02/22/2020= 31 sec using walker    Time 12    Period Weeks    Status New    Target Date 05/16/20      PT LONG TERM GOAL #4   Title Pt will increase 10MWT by at least 0.13 m/s in order to demonstrate clinically significant improvement in community ambulation.    Baseline 02/22/2020- 0.15m/s using Front wheeled walker.    Time 12    Status New    Target Date  05/16/20      PT LONG TERM GOAL #5   Title Patient will demonstrate  ambulation > 500 on all surfaces using walker with modified independence without rest breaks or loss of balance for improved ability to walk community distances.    Baseline 02/22/2020-Patient limited to less than 100 feet of total amb distance using front wheeled walker.    Time 12    Period Weeks    Status New    Target Date 05/16/20                 Plan - 03/19/20 2023    Clinical Impression Statement Patient continues to exhibit increased fatigue with acitivities. He was able to perform the instructed standing LE therex well today provided the brief rest breaks. He and his wife verbalized good understanding of compliance with HEP and no questions regarding handout. Patient will continue to benefit from skilled PT services to improve his overall strength and balance for optimal functional mobility in the home and community.    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    PT Home Exercise Plan Issued HEP for standing therex today.    Consulted and Agree with Plan of Care Patient;Family  member/caregiver    Family Member Consulted Spouse- [name]           Patient will benefit from skilled therapeutic intervention in order to improve the following deficits and impairments:  Abnormal gait,Cardiopulmonary status limiting activity,Decreased activity tolerance,Decreased balance,Decreased coordination,Decreased endurance,Decreased knowledge of use of DME,Decreased mobility,Difficulty walking,Decreased strength,Hypomobility,Impaired perceived functional ability,Impaired flexibility  Visit Diagnosis: Repeated falls  Difficulty in walking, not elsewhere classified  Muscle weakness (generalized)     Problem List Patient Active Problem List   Diagnosis Date Noted  . Diabetes ([place]) 01/08/2016  . Hyperlipidemia 01/08/2016  . Pain in limb 01/08/2016  . Insomnia 10/27/2012  . Unspecified hereditary and idiopathic peripheral neuropathy 03/09/2012  . Lumbar stenosis 12/04/2011    Lewis Moccasin, PT 03/19/2020, 8:29 PM  [place] MAIN [facility] SERVICES [address] [place], [place], [address] Phone: [phone]   Fax:  [phone]  Name: [name] Ardolino Martino Jr. MRN: [id number] Date of Birth: 09-21-1945

## 2020-03-21 ENCOUNTER — Ambulatory Visit: Payer: Medicare HMO

## 2020-03-27 ENCOUNTER — Ambulatory Visit: Payer: Medicare HMO

## 2020-03-27 ENCOUNTER — Other Ambulatory Visit: Payer: Self-pay

## 2020-03-27 ENCOUNTER — Ambulatory Visit: Payer: Medicare HMO | Attending: Internal Medicine

## 2020-03-27 DIAGNOSIS — R262 Difficulty in walking, not elsewhere classified: Secondary | ICD-10-CM | POA: Insufficient documentation

## 2020-03-27 DIAGNOSIS — R296 Repeated falls: Secondary | ICD-10-CM | POA: Insufficient documentation

## 2020-03-27 DIAGNOSIS — M6281 Muscle weakness (generalized): Secondary | ICD-10-CM | POA: Insufficient documentation

## 2020-03-27 NOTE — Therapy (Signed)
[place] MAIN [facility] SERVICES [address] [place], [place], [address] Phone: [phone]   Fax:  [phone]  Physical Therapy Treatment  Patient Details  Name: [name] Batta Fagerstrom Jr. MRN: [id number] Date of Birth: [birth year] Referring Provider (PT): Dr. Benita Stabile   Encounter Date: 03/27/2020   PT End of Session - 03/27/20 1116    Visit Number 9    Number of Visits 25    Date for PT Re-Evaluation 05/16/20    Authorization Type Initial PT eval on 02/22/2020    PT Start Time 1100    Equipment Utilized During Treatment Gait belt    Activity Tolerance No increased pain;Patient limited by fatigue    Behavior During Therapy [facility] for tasks assessed/performed           Past Medical History:  Diagnosis Date  . Arthritis   . Coronary artery disease   . Depression    STARTED AFTER  OPEN HEART SURG.  . Diabetes mellitus without complication (HCC)    Borderline  . Foot pain, bilateral   . Gout   . Hypertension   . Insomnia 10/27/2012  . Lumbosacral spinal stenosis   . Myocardial infarction ([place])   . 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 [address]ta [address]  . Restless leg syndrome   . Stroke [facility])    2008  RIGHT SIDE...HAD FOR A COUPLE OF DAYS AND THEN WENT AWAY  . Stroke ([place])    [place]  2008    Past Surgical History:  Procedure Laterality Date  . BACK SURGERY    . CARDIAC CATHETERIZATION    . COLONOSCOPY WITH PROPOFOL N/A 11/10/2018   Procedure: COLONOSCOPY WITH PROPOFOL;  Surgeon: Robert Bellow, MD;  Location: [facility] ENDOSCOPY;  Service: Endoscopy;  Laterality: N/A;  . CORONARY ARTERY BYPASS GRAFT     (2010 @ [facility]  . EYE SURGERY     ?? BLEPHOPLASTY  . HAND SURGERY     CRUSHING INJURY---1966  HAS ABOUT 6 SURGERIES THEN  . HAND SURGERY Right   . HERNIA REPAIR     BILATERAL  . LUMBAR FUSION  11/25/2011   POSTERIOR    There were no vitals filed for this visit.   Subjective Assessment - 03/27/20 1109    Subjective Patient  reports just feeling okaty    Patient is accompained by: Family member    Limitations Standing;Lifting;Walking;House hold activities    How long can you sit comfortably? No restrictions    How long can you stand comfortably? maybe a minute if I am lucky.    How long can you walk comfortably? Not far at all- maybe a 100 feet.    Patient Stated Goals Regain some strength so I can return to being as independent at possible.    Currently in Pain? Yes    Pain Score 3     Pain Location Back    Pain Descriptors / Indicators Aching    Pain Type Chronic pain    Pain Onset More than a month ago    Aggravating Factors  Prolonged standing/walking    Pain Relieving Factors rest    Multiple Pain Sites No            NEUROMUSCULAR RE-ED Gait trainer: Patient ambulated at 0.56 m/s  X 2 min 30 sec x 2 trials with 1 standing rest break. Cues from TM to take a longer step. Patient unable  to take a significant increase in step length. Time on right LE = 48% and L=52%.  STS with min BUE Support (VC for technique including to scoot forward/Hand/feet placement and to look ahead with forward lean) x 12 reps- Patient did improve with practice requiring less cues- beginning with CGA and improving to Supervision.   Side step over 2 cones at bar with BUE support - mild difficulty lifting LE up and over cone. Patient performed 10 reps each direction with min difficulty but did improve with VC and practice.   Forward/backward  step over orange hurdle x 12 reps with 1UE support- min difficulty with balance yet did improve with practice.   Pt educated throughout session about proper posture and technique with exercises. Improved exercise technique, movement at target joints, use of target muscles after min to mod verbal, visual, tactile cues.  THEREX  Nustep LE only-  Level 2 x 3 min - cues to maintain SPM at 50 or more.  Level 3 x 3 min - reminder to maintain over 50 SPM Total distance= 0.2 miles BORG RPE=  5/10                      PT Education - 03/27/20 1115    Education Details Specific exercise techniques    Person(s) Educated Patient    Methods Explanation;Demonstration;Tactile cues;Verbal cues;Handout    Comprehension Verbalized understanding;Tactile cues required;Returned demonstration;Need further instruction;Verbal cues required            PT Short Term Goals - 02/22/20 2257      PT SHORT TERM GOAL #1   Title Pt will be independent with HEP in order to improve strength and balance in order to decrease fall risk and improve function at home and work.    Baseline 02/22/2020- Patient presents with no formal HEP in place.    Time 6    Period Weeks    Status New    Target Date 04/04/20             PT Long Term Goals - 02/22/20 2258      PT LONG TERM GOAL #1   Title Patient will increase FOTO score to equal to or greater than 47 to demonstrate statistically significant improvement in mobility and quality of life.    Baseline 02/22/2020= 23    Time 12    Period Weeks    Status New    Target Date 05/16/20      PT LONG TERM GOAL #2   Title Pt will decrease 5TSTS by at least 3 seconds in order to demonstrate clinically significant improvement in LE strength.    Baseline 02/22/2020- 35.15 sec    Time 12    Period Weeks    Status New    Target Date 05/16/20      PT LONG TERM GOAL #3   Title Pt will decrease TUG to below 24 seconds/decrease in order to demonstrate decreased fall risk.    Baseline 02/22/2020= 31 sec using walker    Time 12    Period Weeks    Status New    Target Date 05/16/20      PT LONG TERM GOAL #4   Title Pt will increase 10MWT by at least 0.13 m/s in order to demonstrate clinically significant improvement in community ambulation.    Baseline 02/22/2020- 0.79m/s using Front wheeled walker.    Time 12    Status New    Target Date 05/16/20  PT LONG TERM GOAL #5   Title Patient will demonstrate ambulation > 500 on all surfaces using  walker with modified independence without rest breaks or loss of balance for improved ability to walk community distances.    Baseline 02/22/2020-Patient limited to less than 100 feet of total amb distance using front wheeled walker.    Time 12    Period Weeks    Status New    Target Date 05/16/20                 Plan - 03/27/20 1117    Clinical Impression Statement Patient will continue to benefit from skilled PT services to improve his overall strength and balance for optimal functional mobility in the home and community    Personal Factors and Comorbidities Age;Comorbidity 1;Comorbidity 2;Comorbidity 3+    Comorbidities Hypertension, CVA, Diabetes    Examination-Activity Limitations Bathing;Bed Mobility;Bend;Caring for Others;Carry;Dressing;Hygiene/Grooming;Lift;Locomotion Level;Reach Overhead;Squat;Stairs;Stand;Toileting;Transfers    Examination-Participation Restrictions Cleaning;Community Activity;Driving;Laundry;Medication Management;Meal Prep;Shop;Yard Work    [employer] officer Stable/Uncomplicated    Rehab Potential Good    PT Frequency 2x / week    PT Duration 12 weeks    PT Treatment/Interventions ADLs/Self Care Home Management;DME Instruction;Gait training;Stair training;Functional mobility training;Therapeutic activities;Therapeutic exercise;Balance training;Neuromuscular re-education;Patient/family education;Manual techniques    PT Next Visit Plan Continue with progressive strengthening and progress balance activities as appropriate.    PT Home Exercise Plan Issued HEP for standing therex today.    Consulted and Agree with Plan of Care Patient;Family member/caregiver    Family Member Consulted Spouse- [name]           Patient will benefit from skilled therapeutic intervention in order to improve the following deficits and impairments:  Abnormal gait,Cardiopulmonary status limiting activity,Decreased activity tolerance,Decreased balance,Decreased  coordination,Decreased endurance,Decreased knowledge of use of DME,Decreased mobility,Difficulty walking,Decreased strength,Hypomobility,Impaired perceived functional ability,Impaired flexibility  Visit Diagnosis: Repeated falls  Difficulty in walking, not elsewhere classified  Muscle weakness (generalized)     Problem List Patient Active Problem List   Diagnosis Date Noted  . Diabetes ([place]) 01/08/2016  . Hyperlipidemia 01/08/2016  . Pain in limb 01/08/2016  . Insomnia 10/27/2012  . Unspecified hereditary and idiopathic peripheral neuropathy 03/09/2012  . Lumbar stenosis 12/04/2011    Lewis Moccasin, PT 03/27/2020, 11:18 AM  [place] MAIN [facility] SERVICES [address] [place], [place], [address] Phone: [phone]   Fax:  [phone]  Name: [name] Ibe Bonillas Jr. MRN: [id number] Date of Birth: [birth year]

## 2020-03-29 ENCOUNTER — Ambulatory Visit: Payer: Medicare HMO

## 2020-04-02 ENCOUNTER — Ambulatory Visit: Payer: Medicare HMO | Admitting: Physical Therapy

## 2020-04-03 ENCOUNTER — Ambulatory Visit: Payer: Medicare HMO

## 2020-04-05 ENCOUNTER — Ambulatory Visit: Payer: Medicare HMO

## 2020-04-09 ENCOUNTER — Ambulatory Visit: Payer: Medicare HMO | Admitting: Physical Therapy

## 2020-04-10 ENCOUNTER — Ambulatory Visit: Payer: Medicare HMO

## 2020-04-11 ENCOUNTER — Ambulatory Visit: Payer: Medicare HMO

## 2020-04-12 ENCOUNTER — Ambulatory Visit: Payer: Medicare HMO

## 2020-04-16 ENCOUNTER — Ambulatory Visit: Payer: Medicare HMO | Admitting: Physical Therapy

## 2020-04-19 ENCOUNTER — Other Ambulatory Visit: Payer: Self-pay

## 2020-04-19 ENCOUNTER — Ambulatory Visit: Payer: Medicare HMO

## 2020-04-19 DIAGNOSIS — R296 Repeated falls: Secondary | ICD-10-CM

## 2020-04-19 DIAGNOSIS — M6281 Muscle weakness (generalized): Secondary | ICD-10-CM

## 2020-04-19 DIAGNOSIS — R262 Difficulty in walking, not elsewhere classified: Secondary | ICD-10-CM

## 2020-04-19 NOTE — Therapy (Signed)
[place] MAIN [facility] SERVICES [address] [place], [place], [address] Phone: [phone]   Fax:  [phone]  Physical Therapy Treatment/Physical Therapy Progress Note   Dates of reporting period 2/2/022  to   04/19/2020  Patient Details  Name: [name] Shavers Briggs Jr. MRN: [id number] Date of Birth: 02/14/1945 Referring Provider (PT): Dr. Benita Stabile   Encounter Date: 04/19/2020   PT End of Session - 04/19/20 1111    Visit Number 10    Number of Visits 25    Date for PT Re-Evaluation 05/16/20    Authorization Type Initial PT eval on 02/22/2020    PT Start Time 1103    PT Stop Time 1140    PT Time Calculation (min) 37 min    Equipment Utilized During Treatment Gait belt    Activity Tolerance No increased pain;Patient limited by fatigue    Behavior During Therapy [facility] for tasks assessed/performed           Past Medical History:  Diagnosis Date  . Arthritis   . Coronary artery disease   . Depression    STARTED AFTER  OPEN HEART SURG.  . Diabetes mellitus without complication (HCC)    Borderline  . Foot pain, bilateral   . Gout   . Hypertension   . Insomnia 10/27/2012  . Lumbosacral spinal stenosis   . Myocardial infarction ([place])   . 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 L[address]  . Restless leg syndrome   . Stroke [facility]    2008  RIGHT SIDE...HAD FOR A COUPLE OF DAYS AND THEN WENT AWAY  . Stroke ([place])    [place]  2008    Past Surgical History:  Procedure Laterality Date  . BACK SURGERY    . CARDIAC CATHETERIZATION    . COLONOSCOPY WITH PROPOFOL N/A 11/10/2018   Procedure: COLONOSCOPY WITH PROPOFOL;  Surgeon: Robert Bellow, MD;  Location: [facility] ENDOSCOPY;  Service: Endoscopy;  Laterality: N/A;  . CORONARY ARTERY BYPASS GRAFT     (2010 @ [facility]  . EYE SURGERY     ?? BLEPHOPLASTY  . HAND SURGERY     CRUSHING INJURY---1966  HAS ABOUT 6 SURGERIES THEN  . HAND SURGERY Right   . HERNIA REPAIR     BILATERAL  . LUMBAR  FUSION  11/25/2011   POSTERIOR    There were no vitals filed for this visit.   Subjective Assessment - 04/19/20 1110    Subjective Patient reports he is still recovering from a bronchial infection. He admits that he has not felt like doing his HEP while recovering from being sick.    Patient is accompained by: Family member    Pertinent History 75 year-old male referred to PT secondary to falls and LE weakness. Patient reports progressive weakness over past couple of months with increased difficulty walking and performing ADLs- reporting several falls and assist from his wife with ADLs.  PMH: MI, CAD s/p CABG, depression, DM, gout, HTN, insomnia, LSS s/p lumbar fusion 2013, remote CVA s deficit,    Limitations Standing;Lifting;Walking;House hold activities    How long can you sit comfortably? No restrictions    How long can you stand comfortably? maybe a minute if I am lucky.    How long can you walk comfortably? Not far at all- maybe a 100 feet.    Patient Stated Goals Regain some strength so I can return to being  as independent at possible.    Currently in Pain? No/denies    Pain Onset More than a month ago           Reassessed STG and LTGs (See goal section for details)  Patient fatigued after testing so no further treatment today.    Clinical Impression: Patient returned after being absent since 03/27/2020 due to bronchial infection. Despite being out and reporting feeling very weak- patient presented with progress in established goals- including 5x STS, TUG, 10 MWT, and improved functional endurance with much improved gait distance overall. Patient's condition has the potential to improve in response to therapy. Maximum improvement is yet to be obtained. The anticipated improvement is attainable and reasonable in a generally predictable time.                        PT Education - 04/19/20 1419    Education Details explanation of functional outcome measures.     Person(s) Educated Patient    Methods Explanation;Demonstration;Tactile cues;Verbal cues    Comprehension Verbalized understanding;Returned demonstration;Verbal cues required;Tactile cues required;Need further instruction            PT Short Term Goals - 04/19/20 1112      PT SHORT TERM GOAL #1   Title Pt will be independent with HEP in order to improve strength and balance in order to decrease fall risk and improve function at home and work.    Baseline 02/22/2020- Patient presents with no formal HEP in place. 04/19/2020- Patient reports he has been too sick over past 3 weeks. He does reports he is knowledgeable of sitting and standing LE strengthening but has not felt well enough to do them.    Time 6    Period Weeks    Status On-going    Target Date 04/04/20             PT Long Term Goals - 04/19/20 1122      PT LONG TERM GOAL #1   Title Patient will increase FOTO score to equal to or greater than 47 to demonstrate statistically significant improvement in mobility and quality of life.    Baseline 02/22/2020= 23    Time 12    Period Weeks    Status New      PT LONG TERM GOAL #2   Title Pt will decrease 5TSTS by at least 3 seconds in order to demonstrate clinically significant improvement in LE strength.    Baseline 02/22/2020- 35.15 sec. 3/31= 33.87 without UE support.    Time 12    Period Weeks    Status On-going      PT LONG TERM GOAL #3   Title Pt will decrease TUG to below 24 seconds/decrease in order to demonstrate decreased fall risk.    Baseline 02/22/2020= 31 sec using walker    Time 12    Period Weeks    Status New      PT LONG TERM GOAL #4   Title Pt will increase 10MWT by at least 0.13 m/s in order to demonstrate clinically significant improvement in community ambulation.    Baseline 02/22/2020- 0.38m/s using Front wheeled walker. 04/19/2020= 0.6 m/s    Time 12    Status New      PT LONG TERM GOAL #5   Title Patient will demonstrate ambulation > 500 on all  surfaces using walker with modified independence without rest breaks or loss of balance for improved ability to walk community distances.  Baseline 02/22/2020-Patient limited to less than 100 feet of total amb distance using front wheeled walker. 04/19/2020- Patient ambulated around 320 feet today with front wheeled walker, CGA- complaining of LE soreness/fatigue and O2 sat= 98%.    Time 12    Period Weeks    Status New                 Plan - 04/19/20 1422    Clinical Impression Statement Patient returned after being absent since 03/27/2020 due to bronchial infection. Despite being out and reporting feeling very weak- patient presented with progress in established goals- including 5x STS, TUG, 10 MWT, and improved functional endurance with much improved gait distance overall. Patient's condition has the potential to improve in response to therapy. Maximum improvement is yet to be obtained. The anticipated improvement is attainable and reasonable in a generally predictable time.    Personal Factors and Comorbidities Age;Comorbidity 1;Comorbidity 2;Comorbidity 3+    Comorbidities Hypertension, CVA, Diabetes    Examination-Activity Limitations Bathing;Bed Mobility;Bend;Caring for Others;Carry;Dressing;Hygiene/Grooming;Lift;Locomotion Level;Reach Overhead;Squat;Stairs;Stand;Toileting;Transfers    Examination-Participation Restrictions Cleaning;Community Activity;Driving;Laundry;Medication Management;Meal Prep;Shop;Yard Work    [employer] Stable/Uncomplicated    Rehab Potential Good    PT Frequency 2x / week    PT Duration 12 weeks    PT Treatment/Interventions ADLs/Self Care Home Management;DME Instruction;Gait training;Stair training;Functional mobility training;Therapeutic activities;Therapeutic exercise;Balance training;Neuromuscular re-education;Patient/family education;Manual techniques    PT Next Visit Plan Continue with progressive strengthening and progress balance  activities as appropriate.    PT Home Exercise Plan Issued HEP for standing therex today.    Consulted and Agree with Plan of Care Patient;Family member/caregiver    Family Member Consulted Spouse- [name]           Patient will benefit from skilled therapeutic intervention in order to improve the following deficits and impairments:  Abnormal gait,Cardiopulmonary status limiting activity,Decreased activity tolerance,Decreased balance,Decreased coordination,Decreased endurance,Decreased knowledge of use of DME,Decreased mobility,Difficulty walking,Decreased strength,Hypomobility,Impaired perceived functional ability,Impaired flexibility  Visit Diagnosis: Repeated falls  Difficulty in walking, not elsewhere classified  Muscle weakness (generalized)     Problem List Patient Active Problem List   Diagnosis Date Noted  . Diabetes ([place]) 01/08/2016  . Hyperlipidemia 01/08/2016  . Pain in limb 01/08/2016  . Insomnia 10/27/2012  . Unspecified hereditary and idiopathic peripheral neuropathy 03/09/2012  . Lumbar stenosis 12/04/2011    Lewis Moccasin, PT 04/19/2020, 2:28 PM  [place] MAIN [facility] SERVICES [address] [place], [place], [address] Phone: [phone]   Fax:  [phone]  Name: [name] Rotundo Correll Jr. MRN: [id number] Date of Birth: 07/24/1945

## 2020-04-24 ENCOUNTER — Ambulatory Visit: Payer: Medicare HMO | Attending: Internal Medicine

## 2020-04-24 ENCOUNTER — Other Ambulatory Visit: Payer: Self-pay

## 2020-04-24 DIAGNOSIS — R262 Difficulty in walking, not elsewhere classified: Secondary | ICD-10-CM | POA: Insufficient documentation

## 2020-04-24 DIAGNOSIS — R269 Unspecified abnormalities of gait and mobility: Secondary | ICD-10-CM | POA: Diagnosis present

## 2020-04-24 DIAGNOSIS — R296 Repeated falls: Secondary | ICD-10-CM | POA: Insufficient documentation

## 2020-04-24 DIAGNOSIS — M6281 Muscle weakness (generalized): Secondary | ICD-10-CM | POA: Diagnosis present

## 2020-04-24 DIAGNOSIS — R2689 Other abnormalities of gait and mobility: Secondary | ICD-10-CM | POA: Insufficient documentation

## 2020-04-24 DIAGNOSIS — R278 Other lack of coordination: Secondary | ICD-10-CM | POA: Diagnosis present

## 2020-04-24 NOTE — Therapy (Signed)
[place] MAIN [facility] SERVICES [address] [place], [place], [address] Phone: [phone]   Fax:  [phone]  Physical Therapy Treatment  Patient Details  Name: [name] Blatt Basurto Jr. MRN: [id number] Date of Birth: 21-Aug-1945 Referring Provider (PT): Dr. Benita Stabile   Encounter Date: 04/24/2020   PT End of Session - 04/24/20 1722    Visit Number 11    Number of Visits 25    Date for PT Re-Evaluation 05/16/20    Authorization Type Initial PT eval on 02/22/2020    PT Start Time 1101    PT Stop Time 1142    PT Time Calculation (min) 41 min    Equipment Utilized During Treatment Gait belt    Activity Tolerance No increased pain;Patient limited by fatigue    Behavior During Therapy [facility] for tasks assessed/performed           Past Medical History:  Diagnosis Date  . Arthritis   . Coronary artery disease   . Depression    STARTED AFTER  OPEN HEART SURG.  . Diabetes mellitus without complication (HCC)    Borderline  . Foot pain, bilateral   . Gout   . Hypertension   . Insomnia 10/27/2012  . Lumbosacral spinal stenosis   . Myocardial infarction ([place])   . 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 [address]  . Restless leg syndrome   . Stroke [facility])    2008  RIGHT SIDE...HAD FOR A COUPLE OF DAYS AND THEN WENT AWAY  . Stroke ([place])    [place]  2008    Past Surgical History:  Procedure Laterality Date  . BACK SURGERY    . CARDIAC CATHETERIZATION    . COLONOSCOPY WITH PROPOFOL N/A 11/10/2018   Procedure: COLONOSCOPY WITH PROPOFOL;  Surgeon: Robert Bellow, MD;  Location: [facility] ENDOSCOPY;  Service: Endoscopy;  Laterality: N/A;  . CORONARY ARTERY BYPASS GRAFT     (2010 @ [facility]  . EYE SURGERY     ?? BLEPHOPLASTY  . HAND SURGERY     CRUSHING INJURY---1966  HAS ABOUT 6 SURGERIES THEN  . HAND SURGERY Right   . HERNIA REPAIR     BILATERAL  . LUMBAR FUSION  11/25/2011   POSTERIOR    There were no vitals filed for this visit.    Subjective Assessment - 04/24/20 1721    Subjective Patient reports doing okay today but having some balance issues    Patient is accompained by: Family member    Pertinent History 75 year-old male referred to PT secondary to falls and LE weakness. Patient reports progressive weakness over past couple of months with increased difficulty walking and performing ADLs- reporting several falls and assist from his wife with ADLs.  PMH: MI, CAD s/p CABG, depression, DM, gout, HTN, insomnia, LSS s/p lumbar fusion 2013, remote CVA s deficit,    Limitations Standing;Lifting;Walking;House hold activities    How long can you sit comfortably? No restrictions    How long can you stand comfortably? maybe a minute if I am lucky.    How long can you walk comfortably? Not far at all- maybe a 100 feet.    Patient Stated Goals Regain some strength so I can return to being as independent at possible.    Currently in Pain? No/denies    Pain Onset More than a month ago  Nustep  Level 0  LE only seat at 11 for 6 min; 0.3miles (Unbilled)  Seated LE strengthening  Hip flex 3lb. AW BLE x 15 reps Hip abd 3lb. AW BLE x 15 reps Hip add squeeze with ball - 3 sec hold x 10 reps Knee ext 3lb. AW BLE x 15 reps Ankle DF 3lb. AW BLE x 10 reps Ankle PF 3lb. AW BLE x 10 reps Sit to stand without UE support- x 10 reps Patient fatigued requiring brief rest break but denied pain Pt educated throughout session about proper posture and technique with exercises. Improved exercise technique, movement at target joints, use of target muscles after min to mod verbal, visual, tactile cues.  Patient ambulated approx 200 feet using his front wheeled walker, SBA exhibiting short reciprocal steps initially with good heel to toe sequencing yet decreased over time requiring occasional VC for step length and posture- exhibiting forward flex trunk with fatigue yet no loss of balance.   Clinical Impression: Patient able to resume  some of his seated LE strengthening exercises with resistance today without pain or significant difficulty other than fatigue. Patient presented with good carryover of instruction and able to perform each exercise correctly today. He will continue to benefit from skilled PT services to improve his overall strength and balance for optimal functional mobility in the home and community                           PT Education - 04/24/20 1722    Education Details specific exercise cues    Person(s) Educated Patient    Methods Explanation;Demonstration;Tactile cues;Verbal cues    Comprehension Verbalized understanding;Tactile cues required;Returned demonstration;Need further instruction;Verbal cues required            PT Short Term Goals - 04/19/20 1112      PT SHORT TERM GOAL #1   Title Pt will be independent with HEP in order to improve strength and balance in order to decrease fall risk and improve function at home and work.    Baseline 02/22/2020- Patient presents with no formal HEP in place. 04/19/2020- Patient reports he has been too sick over past 3 weeks. He does reports he is knowledgeable of sitting and standing LE strengthening but has not felt well enough to do them.    Time 6    Period Weeks    Status On-going    Target Date 04/04/20             PT Long Term Goals - 04/19/20 1122      PT LONG TERM GOAL #1   Title Patient will increase FOTO score to equal to or greater than 47 to demonstrate statistically significant improvement in mobility and quality of life.    Baseline 02/22/2020= 23    Time 12    Period Weeks    Status New      PT LONG TERM GOAL #2   Title Pt will decrease 5TSTS by at least 3 seconds in order to demonstrate clinically significant improvement in LE strength.    Baseline 02/22/2020- 35.15 sec. 3/31= 33.87 without UE support.    Time 12    Period Weeks    Status On-going      PT LONG TERM GOAL #3   Title Pt will decrease TUG to below  24 seconds/decrease in order to demonstrate decreased fall risk.    Baseline 02/22/2020= 31 sec using walker    Time 12  Period Weeks    Status New      PT LONG TERM GOAL #4   Title Pt will increase 10MWT by at least 0.13 m/s in order to demonstrate clinically significant improvement in community ambulation.    Baseline 02/22/2020- 0.79m/s using Front wheeled walker. 04/19/2020= 0.6 m/s    Time 12    Status New      PT LONG TERM GOAL #5   Title Patient will demonstrate ambulation > 500 on all surfaces using walker with modified independence without rest breaks or loss of balance for improved ability to walk community distances.    Baseline 02/22/2020-Patient limited to less than 100 feet of total amb distance using front wheeled walker. 04/19/2020- Patient ambulated around 320 feet today with front wheeled walker, CGA- complaining of LE soreness/fatigue and O2 sat= 98%.    Time 12    Period Weeks    Status New                 Plan - 04/24/20 1723    Clinical Impression Statement Patient able to resume some of his seated LE strengthening exercises with resistance today without pain or significant difficulty other than fatigue. Patient presented with good carryover of instruction and able to perform each exercise correctly today. He will continue to benefit from skilled PT services to improve his overall strength and balance for optimal functional mobility in the home and community    Personal Factors and Comorbidities Age;Comorbidity 1;Comorbidity 2;Comorbidity 3+    Comorbidities Hypertension, CVA, Diabetes    Examination-Activity Limitations Bathing;Bed Mobility;Bend;Caring for Others;Carry;Dressing;Hygiene/Grooming;Lift;Locomotion Level;Reach Overhead;Squat;Stairs;Stand;Toileting;Transfers    Examination-Participation Restrictions Cleaning;Community Activity;Driving;Laundry;Medication Management;Meal Prep;Shop;Yard Work    [employer] officer Stable/Uncomplicated     Rehab Potential Good    PT Frequency 2x / week    PT Duration 12 weeks    PT Treatment/Interventions ADLs/Self Care Home Management;DME Instruction;Gait training;Stair training;Functional mobility training;Therapeutic activities;Therapeutic exercise;Balance training;Neuromuscular re-education;Patient/family education;Manual techniques    PT Next Visit Plan Continue with progressive strengthening and progress balance activities as appropriate.    PT Home Exercise Plan Issued HEP for standing therex today.    Consulted and Agree with Plan of Care Patient;Family member/caregiver    Family Member Consulted Spouse- [name]           Patient will benefit from skilled therapeutic intervention in order to improve the following deficits and impairments:  Abnormal gait,Cardiopulmonary status limiting activity,Decreased activity tolerance,Decreased balance,Decreased coordination,Decreased endurance,Decreased knowledge of use of DME,Decreased mobility,Difficulty walking,Decreased strength,Hypomobility,Impaired perceived functional ability,Impaired flexibility  Visit Diagnosis: Repeated falls  Difficulty in walking, not elsewhere classified  Muscle weakness (generalized)     Problem List Patient Active Problem List   Diagnosis Date Noted  . Diabetes ([place]) 01/08/2016  . Hyperlipidemia 01/08/2016  . Pain in limb 01/08/2016  . Insomnia 10/27/2012  . Unspecified hereditary and idiopathic peripheral neuropathy 03/09/2012  . Lumbar stenosis 12/04/2011    Lewis Moccasin 04/24/2020, 5:29 PM  [place] MAIN [facility] SERVICES [address] [place], [place], [address] Phone: [phone]   Fax:  [phone]  Name: Hitoshi Werts Hymon Jr. MRN: [id number] Date of Birth: [birth year]

## 2020-04-26 ENCOUNTER — Ambulatory Visit: Payer: Medicare HMO

## 2020-04-26 ENCOUNTER — Other Ambulatory Visit: Payer: Self-pay

## 2020-04-26 DIAGNOSIS — R296 Repeated falls: Secondary | ICD-10-CM | POA: Diagnosis not present

## 2020-04-26 DIAGNOSIS — M6281 Muscle weakness (generalized): Secondary | ICD-10-CM

## 2020-04-26 DIAGNOSIS — R262 Difficulty in walking, not elsewhere classified: Secondary | ICD-10-CM

## 2020-04-26 NOTE — Therapy (Signed)
[place] MAIN [facility] SERVICES [address] [place], [place], [address] Phone: [phone]   Fax:  [phone]  Physical Therapy Treatment  Patient Details  Name: [name] Feig Rathbun Jr. MRN: [id number] Date of Birth: Mcwatters 26, 1947 Referring Provider (PT): Dr. Benita Stabile   Encounter Date: 04/26/2020   PT End of Session - 04/26/20 1057    Visit Number 12    Number of Visits 25    Date for PT Re-Evaluation 05/16/20    Authorization Type Initial PT eval on 02/22/2020    PT Start Time 1053    PT Stop Time 1130    PT Time Calculation (min) 37 min    Equipment Utilized During Treatment Gait belt    Activity Tolerance No increased pain;Patient limited by fatigue    Behavior During Therapy [facility] for tasks assessed/performed           Past Medical History:  Diagnosis Date  . Arthritis   . Coronary artery disease   . Depression    STARTED AFTER  OPEN HEART SURG.  . Diabetes mellitus without complication (HCC)    Borderline  . Foot pain, bilateral   . Gout   . Hypertension   . Insomnia 10/27/2012  . Lumbosacral spinal stenosis   . Myocardial infarction ([place])   . 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 [address]  . Restless leg syndrome   . Stroke [facility])    2008  RIGHT SIDE...HAD FOR A COUPLE OF DAYS AND THEN WENT AWAY  . Stroke ([place])    [place]  2008    Past Surgical History:  Procedure Laterality Date  . BACK SURGERY    . CARDIAC CATHETERIZATION    . COLONOSCOPY WITH PROPOFOL N/A 11/10/2018   Procedure: COLONOSCOPY WITH PROPOFOL;  Surgeon: Robert Bellow, MD;  Location: [facility] ENDOSCOPY;  Service: Endoscopy;  Laterality: N/A;  . CORONARY ARTERY BYPASS GRAFT     (2010 @ [facility]  . EYE SURGERY     ?? BLEPHOPLASTY  . HAND SURGERY     CRUSHING INJURY---1966  HAS ABOUT 6 SURGERIES THEN  . HAND SURGERY Right   . HERNIA REPAIR     BILATERAL  . LUMBAR FUSION  11/25/2011   POSTERIOR    There were no vitals filed for this visit.    Subjective Assessment - 04/26/20 1056    Subjective Patient reports no new complaints. Denies any falls and reports walking with walker.    Patient is accompained by: Family member    Pertinent History 75 year-old male referred to PT secondary to falls and LE weakness. Patient reports progressive weakness over past couple of months with increased difficulty walking and performing ADLs- reporting several falls and assist from his wife with ADLs.  PMH: MI, CAD s/p CABG, depression, DM, gout, HTN, insomnia, LSS s/p lumbar fusion 2013, remote CVA s deficit,    Limitations Standing;Lifting;Walking;House hold activities    How long can you sit comfortably? No restrictions    How long can you stand comfortably? maybe a minute if I am lucky.    How long can you walk comfortably? Not far at all- maybe a 100 feet.    Patient Stated Goals Regain some strength so I can return to being as independent at possible.    Currently in Pain? No/denies    Pain Onset More than a month ago  Interventions:   Neuro re-education:   Static standing in varying foot positions  WBOS in corner - eyes open for 30 sec - No LOB or difficulty.  WBOS - eyes closed 3x 30 sec- mild increase sway yet no significant loss of balance.  NBOS- eyes open for 30 sec - x 3 trials- mild increase sway yet no loss of balance.  Staggered stance x 20 sec x3 - each foot - Minor Loss of balance - able to use corner or reach with BUE for support.   Progressed to Ant/post weight shift- with above feet positions. Patient challenged with increased unsteadiness with posterior weight shift with diminished balance reactions.       Access Code: [email or web address] URL: [email or web address] Date: 04/26/2020 Prepared by: Sande Brothers, PT  Exercises Standing Romberg to 3/4 Tandem Stance - 1 x daily - 3 x weekly - 3 sets - 20 hold Standing Balance in Corner with Eyes Closed - 1 x daily - 3 x weekly - 3 sets - 20  hold Romberg Stance with Eyes Closed - 1 x daily - 3 x weekly - 3 sets - 20 hold   Clinical Impression: Patient performed well with standing static balance activities yet became more challenged with anterior/posterior weight shifting. He was issued a handout and verbalized understanding of program today. He will continue to benefit from skilled PT services to improve his overall strength and balance for optimal functional mobility in the home and community                   PT Education - 04/26/20 1511    Education Details Balance technique education    Person(s) Educated Patient    Methods Explanation;Demonstration;Tactile cues;Verbal cues    Comprehension Verbalized understanding;Returned demonstration;Verbal cues required;Tactile cues required;Need further instruction            PT Short Term Goals - 04/19/20 1112      PT SHORT TERM GOAL #1   Title Pt will be independent with HEP in order to improve strength and balance in order to decrease fall risk and improve function at home and work.    Baseline 02/22/2020- Patient presents with no formal HEP in place. 04/19/2020- Patient reports he has been too sick over past 3 weeks. He does reports he is knowledgeable of sitting and standing LE strengthening but has not felt well enough to do them.    Time 6    Period Weeks    Status On-going    Target Date 04/04/20             PT Long Term Goals - 04/19/20 1122      PT LONG TERM GOAL #1   Title Patient will increase FOTO score to equal to or greater than 47 to demonstrate statistically significant improvement in mobility and quality of life.    Baseline 02/22/2020= 23    Time 12    Period Weeks    Status New      PT LONG TERM GOAL #2   Title Pt will decrease 5TSTS by at least 3 seconds in order to demonstrate clinically significant improvement in LE strength.    Baseline 02/22/2020- 35.15 sec. 3/31= 33.87 without UE support.    Time 12    Period Weeks    Status  On-going      PT LONG TERM GOAL #3   Title Pt will decrease TUG to below 24 seconds/decrease in order to demonstrate decreased fall risk.    Baseline  02/22/2020= 31 sec using walker    Time 12    Period Weeks    Status New      PT LONG TERM GOAL #4   Title Pt will increase 10MWT by at least 0.13 m/s in order to demonstrate clinically significant improvement in community ambulation.    Baseline 02/22/2020- 0.71m/s using Front wheeled walker. 04/19/2020= 0.6 m/s    Time 12    Status New      PT LONG TERM GOAL #5   Title Patient will demonstrate ambulation > 500 on all surfaces using walker with modified independence without rest breaks or loss of balance for improved ability to walk community distances.    Baseline 02/22/2020-Patient limited to less than 100 feet of total amb distance using front wheeled walker. 04/19/2020- Patient ambulated around 320 feet today with front wheeled walker, CGA- complaining of LE soreness/fatigue and O2 sat= 98%.    Time 12    Period Weeks    Status New                 Plan - 04/26/20 1057    Clinical Impression Statement Patient performed well with standing static balance activities yet became more challenged with anterior/posterior weight shifting. He was issued a handout and verbalized understanding of program today. He will continue to benefit from skilled PT services to improve his overall strength and balance for optimal functional mobility in the home and community    Personal Factors and Comorbidities Age;Comorbidity 1;Comorbidity 2;Comorbidity 3+    Comorbidities Hypertension, CVA, Diabetes    Examination-Activity Limitations Bathing;Bed Mobility;Bend;Caring for Others;Carry;Dressing;Hygiene/Grooming;Lift;Locomotion Level;Reach Overhead;Squat;Stairs;Stand;Toileting;Transfers    Examination-Participation Restrictions Cleaning;Community Activity;Driving;Laundry;Medication Management;Meal Prep;Shop;Yard Work    [employer]  Stable/Uncomplicated    Rehab Potential Good    PT Frequency 2x / week    PT Duration 12 weeks    PT Treatment/Interventions ADLs/Self Care Home Management;DME Instruction;Gait training;Stair training;Functional mobility training;Therapeutic activities;Therapeutic exercise;Balance training;Neuromuscular re-education;Patient/family education;Manual techniques    PT Next Visit Plan Continue with progressive strengthening and progress balance activities as appropriate.    PT Home Exercise Plan --    Consulted and Agree with Plan of Care Patient;Family member/caregiver    Family Member Consulted Spouse- [name]           Patient will benefit from skilled therapeutic intervention in order to improve the following deficits and impairments:  Abnormal gait,Cardiopulmonary status limiting activity,Decreased activity tolerance,Decreased balance,Decreased coordination,Decreased endurance,Decreased knowledge of use of DME,Decreased mobility,Difficulty walking,Decreased strength,Hypomobility,Impaired perceived functional ability,Impaired flexibility  Visit Diagnosis: Repeated falls  Difficulty in walking, not elsewhere classified  Muscle weakness (generalized)     Problem List Patient Active Problem List   Diagnosis Date Noted  . Diabetes ([place]) 01/08/2016  . Hyperlipidemia 01/08/2016  . Pain in limb 01/08/2016  . Insomnia 10/27/2012  . Unspecified hereditary and idiopathic peripheral neuropathy 03/09/2012  . Lumbar stenosis 12/04/2011    Lewis Moccasin, PT 04/26/2020, 3:24 PM  [place] MAIN [facility] SERVICES [address] [place], [place], [address] Phone: [phone]   Fax:  [phone]  Name: [name] Kronberg Goforth Jr. MRN: [id number] Date of Birth: [birth year]

## 2020-05-01 ENCOUNTER — Ambulatory Visit: Payer: Medicare HMO

## 2020-05-01 ENCOUNTER — Other Ambulatory Visit: Payer: Self-pay

## 2020-05-01 DIAGNOSIS — R296 Repeated falls: Secondary | ICD-10-CM | POA: Diagnosis not present

## 2020-05-01 DIAGNOSIS — R278 Other lack of coordination: Secondary | ICD-10-CM

## 2020-05-01 DIAGNOSIS — R269 Unspecified abnormalities of gait and mobility: Secondary | ICD-10-CM

## 2020-05-01 DIAGNOSIS — R262 Difficulty in walking, not elsewhere classified: Secondary | ICD-10-CM

## 2020-05-01 DIAGNOSIS — M6281 Muscle weakness (generalized): Secondary | ICD-10-CM

## 2020-05-01 NOTE — Therapy (Signed)
[place] MAIN [facility] SERVICES [address] [place], [place], [address] Phone: [phone]   Fax:  [phone]  Physical Therapy Treatment  Patient Details  Name: [name] Dulworth Pixley Jr. MRN: [id number] Date of Birth: 1945-11-19 Referring Provider (PT): Dr. Benita Stabile   Encounter Date: 05/01/2020   PT End of Session - 05/01/20 1111    Visit Number 13    Number of Visits 25    Date for PT Re-Evaluation 05/16/20    Authorization Type Initial PT eval on 02/22/2020    PT Start Time 1100    PT Stop Time 1145    PT Time Calculation (min) 45 min    Equipment Utilized During Treatment Gait belt    Activity Tolerance No increased pain;Patient limited by fatigue    Behavior During Therapy [facility] for tasks assessed/performed           Past Medical History:  Diagnosis Date  . Arthritis   . Coronary artery disease   . Depression    STARTED AFTER  OPEN HEART SURG.  . Diabetes mellitus without complication (HCC)    Borderline  . Foot pain, bilateral   . Gout   . Hypertension   . Insomnia 10/27/2012  . Lumbosacral spinal stenosis   . Myocardial infarction ([place])   . 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 [address]  . Restless leg syndrome   . Stroke [facility])    2008  RIGHT SIDE...HAD FOR A COUPLE OF DAYS AND THEN WENT AWAY  . Stroke ([place])    [place]  2008    Past Surgical History:  Procedure Laterality Date  . BACK SURGERY    . CARDIAC CATHETERIZATION    . COLONOSCOPY WITH PROPOFOL N/A 11/10/2018   Procedure: COLONOSCOPY WITH PROPOFOL;  Surgeon: Robert Bellow, MD;  Location: [facility] ENDOSCOPY;  Service: Endoscopy;  Laterality: N/A;  . CORONARY ARTERY BYPASS GRAFT     (2010 @ [facility]  . EYE SURGERY     ?? BLEPHOPLASTY  . HAND SURGERY     CRUSHING INJURY---1966  HAS ABOUT 6 SURGERIES THEN  . HAND SURGERY Right   . HERNIA REPAIR     BILATERAL  . LUMBAR FUSION  11/25/2011   POSTERIOR    There were no vitals filed for this visit.    Subjective Assessment - 05/01/20 1110    Subjective Patient reports doing well overall today. States he Snead even try to go fishing today if able.    Patient is accompained by: Family member    Pertinent History 75 year-old male referred to PT secondary to falls and LE weakness. Patient reports progressive weakness over past couple of months with increased difficulty walking and performing ADLs- reporting several falls and assist from his wife with ADLs.  PMH: MI, CAD s/p CABG, depression, DM, gout, HTN, insomnia, LSS s/p lumbar fusion 2013, remote CVA s deficit,    Limitations Standing;Lifting;Walking;House hold activities    How long can you sit comfortably? No restrictions    How long can you stand comfortably? maybe a minute if I am lucky.    How long can you walk comfortably? Not far at all- maybe a 100 feet.    Patient Stated Goals Regain some strength so I can return to being as independent at possible.    Currently in Pain? No/denies    Pain Onset More than a month ago  Interventions.   Sit to stand without UE support x 6 reps - Patient rates as "Hard"   Side steps over 1/2 white foam x 10 reps- difficulty coordinating movement to left - placement of feet with mild unsteadiness Forward/backward steps over 1/2 white foam-Difficulty with backward steps- requiring 1UE support initially but did improve to no UE support.  Patient rated a "Hard"  Step up without UE support- Mild difficulty coordinating movement but did improve with practice. Unsteadiness throughout yet patient able rated as "hard" both from a strength and coordination standpoint.   Mini lunge with 1UE support- x 7 reps- Patient rated as "hard" and fearful of falling requiring CGA with gait belt.    Ambulation in hallway- 515 feet today with use of RW, CGA with reciprocal steps in 4 min 52 sec - stopped and reported a 5/10 on modified BORG scale.      Clinical Impression: Patient continues to exhibit  increased overall fatigue with functional endurance/strengthening activities. He rates many of today's activities as Hard but able to complete. He is still limited with ambulation as well yet demonstrated improved overall functional endurance. He will continue to benefit from skilled PT services to improve his overall strength and balance for optimal functional mobility in the home and community                         PT Education - 05/01/20 1550    Education Details specific exercise technique    Person(s) Educated Patient    Methods Explanation;Demonstration;Tactile cues;Verbal cues    Comprehension Verbalized understanding;Tactile cues required;Returned demonstration;Need further instruction;Verbal cues required            PT Short Term Goals - 04/19/20 1112      PT SHORT TERM GOAL #1   Title Pt will be independent with HEP in order to improve strength and balance in order to decrease fall risk and improve function at home and work.    Baseline 02/22/2020- Patient presents with no formal HEP in place. 04/19/2020- Patient reports he has been too sick over past 3 weeks. He does reports he is knowledgeable of sitting and standing LE strengthening but has not felt well enough to do them.    Time 6    Period Weeks    Status On-going    Target Date 04/04/20             PT Long Term Goals - 04/19/20 1122      PT LONG TERM GOAL #1   Title Patient will increase FOTO score to equal to or greater than 47 to demonstrate statistically significant improvement in mobility and quality of life.    Baseline 02/22/2020= 23    Time 12    Period Weeks    Status New      PT LONG TERM GOAL #2   Title Pt will decrease 5TSTS by at least 3 seconds in order to demonstrate clinically significant improvement in LE strength.    Baseline 02/22/2020- 35.15 sec. 3/31= 33.87 without UE support.    Time 12    Period Weeks    Status On-going      PT LONG TERM GOAL #3   Title Pt will  decrease TUG to below 24 seconds/decrease in order to demonstrate decreased fall risk.    Baseline 02/22/2020= 31 sec using walker    Time 12    Period Weeks    Status New  PT LONG TERM GOAL #4   Title Pt will increase 10MWT by at least 0.13 m/s in order to demonstrate clinically significant improvement in community ambulation.    Baseline 02/22/2020- 0.69m/s using Front wheeled walker. 04/19/2020= 0.6 m/s    Time 12    Status New      PT LONG TERM GOAL #5   Title Patient will demonstrate ambulation > 500 on all surfaces using walker with modified independence without rest breaks or loss of balance for improved ability to walk community distances.    Baseline 02/22/2020-Patient limited to less than 100 feet of total amb distance using front wheeled walker. 04/19/2020- Patient ambulated around 320 feet today with front wheeled walker, CGA- complaining of LE soreness/fatigue and O2 sat= 98%.    Time 12    Period Weeks    Status New                 Plan - 05/01/20 1111    Clinical Impression Statement Patient continues to exhibit increased overall fatigue with functional endurance/strengthening activities. He rates many of today's activities as Hard but able to complete. He is still limited with ambulation as well yet demonstrated improved overall functional endurance. He will continue to benefit from skilled PT services to improve his overall strength and balance for optimal functional mobility in the home and community    Personal Factors and Comorbidities Age;Comorbidity 1;Comorbidity 2;Comorbidity 3+    Comorbidities Hypertension, CVA, Diabetes    Examination-Activity Limitations Bathing;Bed Mobility;Bend;Caring for Others;Carry;Dressing;Hygiene/Grooming;Lift;Locomotion Level;Reach Overhead;Squat;Stairs;Stand;Toileting;Transfers    Examination-Participation Restrictions Cleaning;Community Activity;Driving;Laundry;Medication Management;Meal Prep;Shop;Yard Work    Copy Stable/Uncomplicated    Rehab Potential Good    PT Frequency 2x / week    PT Duration 12 weeks    PT Treatment/Interventions ADLs/Self Care Home Management;DME Instruction;Gait training;Stair training;Functional mobility training;Therapeutic activities;Therapeutic exercise;Balance training;Neuromuscular re-education;Patient/family education;Manual techniques    PT Next Visit Plan Continue with progressive strengthening and progress balance activities as appropriate.    PT Home Exercise Plan no changes today.    Consulted and Agree with Plan of Care Patient;Family member/caregiver    Family Member Consulted Spouse- [name]           Patient will benefit from skilled therapeutic intervention in order to improve the following deficits and impairments:  Abnormal gait,Cardiopulmonary status limiting activity,Decreased activity tolerance,Decreased balance,Decreased coordination,Decreased endurance,Decreased knowledge of use of DME,Decreased mobility,Difficulty walking,Decreased strength,Hypomobility,Impaired perceived functional ability,Impaired flexibility  Visit Diagnosis: Abnormality of gait and mobility  Difficulty in walking, not elsewhere classified  Muscle weakness (generalized)  Repeated falls  Other lack of coordination     Problem List Patient Active Problem List   Diagnosis Date Noted  . Diabetes ([place]) 01/08/2016  . Hyperlipidemia 01/08/2016  . Pain in limb 01/08/2016  . Insomnia 10/27/2012  . Unspecified hereditary and idiopathic peripheral neuropathy 03/09/2012  . Lumbar stenosis 12/04/2011    Lewis Moccasin, PT 05/01/2020, 3:56 PM  [place] MAIN [facility] SERVICES [address] [place], [place], [address] Phone: [phone]   Fax:  [phone]  Name: [name] Lafosse Primmer Jr. MRN: [id number] Date of Birth: July 02, 1945

## 2020-05-03 ENCOUNTER — Other Ambulatory Visit: Payer: Self-pay

## 2020-05-03 ENCOUNTER — Ambulatory Visit: Payer: Medicare HMO

## 2020-05-03 DIAGNOSIS — R296 Repeated falls: Secondary | ICD-10-CM | POA: Diagnosis not present

## 2020-05-03 DIAGNOSIS — R2689 Other abnormalities of gait and mobility: Secondary | ICD-10-CM

## 2020-05-03 DIAGNOSIS — R262 Difficulty in walking, not elsewhere classified: Secondary | ICD-10-CM

## 2020-05-03 DIAGNOSIS — M6281 Muscle weakness (generalized): Secondary | ICD-10-CM

## 2020-05-03 DIAGNOSIS — R269 Unspecified abnormalities of gait and mobility: Secondary | ICD-10-CM

## 2020-05-03 DIAGNOSIS — R278 Other lack of coordination: Secondary | ICD-10-CM

## 2020-05-03 NOTE — Therapy (Signed)
[place] MAIN [facility] SERVICES [address] [place], [place], [address] Phone: [phone]   Fax:  [phone]  Physical Therapy Treatment  Patient Details  Name: [name] Hemenway Towson Jr. MRN: [id number] Date of Birth: 04-03-1945 Referring Provider (PT): Dr. Benita Stabile   Encounter Date: 05/03/2020   PT End of Session - 05/03/20 1106    Visit Number 14    Number of Visits 25    Date for PT Re-Evaluation 05/16/20    Authorization Type Initial PT eval on 02/22/2020    PT Start Time 1101    PT Stop Time 1144    PT Time Calculation (min) 43 min    Equipment Utilized During Treatment Gait belt    Activity Tolerance No increased pain;Patient limited by fatigue    Behavior During Therapy [facility] for tasks assessed/performed           Past Medical History:  Diagnosis Date  . Arthritis   . Coronary artery disease   . Depression    STARTED AFTER  OPEN HEART SURG.  . Diabetes mellitus without complication (HCC)    Borderline  . Foot pain, bilateral   . Gout   . Hypertension   . Insomnia 10/27/2012  . Lumbosacral spinal stenosis   . Myocardial infarction ([place])   . 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 [address]  . Restless leg syndrome   . Stroke [facility])    2008  RIGHT SIDE...HAD FOR A COUPLE OF DAYS AND THEN WENT AWAY  . Stroke ([place])    [place]  2008    Past Surgical History:  Procedure Laterality Date  . BACK SURGERY    . CARDIAC CATHETERIZATION    . COLONOSCOPY WITH PROPOFOL N/A 11/10/2018   Procedure: COLONOSCOPY WITH PROPOFOL;  Surgeon: Robert Bellow, MD;  Location: [facility] ENDOSCOPY;  Service: Endoscopy;  Laterality: N/A;  . CORONARY ARTERY BYPASS GRAFT     (2010 @ [facility]  . EYE SURGERY     ?? BLEPHOPLASTY  . HAND SURGERY     CRUSHING INJURY---1966  HAS ABOUT 6 SURGERIES THEN  . HAND SURGERY Right   . HERNIA REPAIR     BILATERAL  . LUMBAR FUSION  11/25/2011   POSTERIOR    There were no vitals filed for this visit.    Subjective Assessment - 05/03/20 1106    Subjective Patient reports he was fatigued after last visit and went home and rested.    Patient is accompained by: Family member    Pertinent History 75 year-old male referred to PT secondary to falls and LE weakness. Patient reports progressive weakness over past couple of months with increased difficulty walking and performing ADLs- reporting several falls and assist from his wife with ADLs.  PMH: MI, CAD s/p CABG, depression, DM, gout, HTN, insomnia, LSS s/p lumbar fusion 2013, remote CVA s deficit,    Limitations Standing;Lifting;Walking;House hold activities    How long can you sit comfortably? No restrictions    How long can you stand comfortably? maybe a minute if I am lucky.    How long can you walk comfortably? Not far at all- maybe a 100 feet.    Patient Stated Goals Regain some strength so I can return to being as independent at possible.    Currently in Pain? No/denies    Pain Onset More than a month ago  Interventions.   Nustep L3 LE only x 5 min at 0.19 mi-  Rates 3-4/10 on    Therapeutic activity:  Gait indoor/outdoors using front wheeled walker with CGA- Patient ambulated using his RW across stable and unstable surface outside. Negotiating changing surfaces from grass to sidewalk, across brick with turns and obstacles in pathway without LOB. He was able to participate well - exhibiting good ability to negotiate curbs, [facility] terrrain, pine straw, Able to sit to stand from bench with good use of UE support. He required 2 short rest breaks- maintain 92% O2 and HR= 78-117 bpm measured today. He was limited by fatigue only today.       Clinical Impression: Patient performed well outdoors today with use of walker. He demonstrated safe mobility using his walker however continues to exhibit fatigue with distances over 300 feet requiring short rest breaks- Maintain good vitals and stated just tiring today.  He will continue  to benefit from skilled PT services to improve his overall strength and balance for optimal functional mobility in the home and community                                              PT Education - 05/04/20 0723    Education Details Gait cues for safety with outdoor mobility    Person(s) Educated Patient    Methods Explanation;Verbal cues    Comprehension Verbalized understanding;Returned demonstration            PT Short Term Goals - 04/19/20 1112      PT SHORT TERM GOAL #1   Title Pt will be independent with HEP in order to improve strength and balance in order to decrease fall risk and improve function at home and work.    Baseline 02/22/2020- Patient presents with no formal HEP in place. 04/19/2020- Patient reports he has been too sick over past 3 weeks. He does reports he is knowledgeable of sitting and standing LE strengthening but has not felt well enough to do them.    Time 6    Period Weeks    Status On-going    Target Date 04/04/20             PT Long Term Goals - 04/19/20 1122      PT LONG TERM GOAL #1   Title Patient will increase FOTO score to equal to or greater than 47 to demonstrate statistically significant improvement in mobility and quality of life.    Baseline 02/22/2020= 23    Time 12    Period Weeks    Status New      PT LONG TERM GOAL #2   Title Pt will decrease 5TSTS by at least 3 seconds in order to demonstrate clinically significant improvement in LE strength.    Baseline 02/22/2020- 35.15 sec. 3/31= 33.87 without UE support.    Time 12    Period Weeks    Status On-going      PT LONG TERM GOAL #3   Title Pt will decrease TUG to below 24 seconds/decrease in order to demonstrate decreased fall risk.    Baseline 02/22/2020= 31 sec using walker    Time 12    Period Weeks    Status New      PT LONG TERM GOAL #4   Title Pt will increase 10MWT by at least 0.13 m/s in order  to demonstrate clinically significant  improvement in community ambulation.    Baseline 02/22/2020- 0.25m/s using Front wheeled walker. 04/19/2020= 0.6 m/s    Time 12    Status New      PT LONG TERM GOAL #5   Title Patient will demonstrate ambulation > 500 on all surfaces using walker with modified independence without rest breaks or loss of balance for improved ability to walk community distances.    Baseline 02/22/2020-Patient limited to less than 100 feet of total amb distance using front wheeled walker. 04/19/2020- Patient ambulated around 320 feet today with front wheeled walker, CGA- complaining of LE soreness/fatigue and O2 sat= 98%.    Time 12    Period Weeks    Status New                 Plan - 05/03/20 1107    Clinical Impression Statement Patient performed well outdoors today with use of walker. He demonstrated safe mobility using his walker however continues to exhibit fatigue with distances over 300 feet requiring short rest breaks- Maintain good vitals and stated just tiring today.  He will continue to benefit from skilled PT services to improve his overall strength and balance for optimal functional mobility in the home and community    Personal Factors and Comorbidities Age;Comorbidity 1;Comorbidity 2;Comorbidity 3+    Comorbidities Hypertension, CVA, Diabetes    Examination-Activity Limitations Bathing;Bed Mobility;Bend;Caring for Others;Carry;Dressing;Hygiene/Grooming;Lift;Locomotion Level;Reach Overhead;Squat;Stairs;Stand;Toileting;Transfers    Examination-Participation Restrictions Cleaning;Community Activity;Driving;Laundry;Medication Management;Meal Prep;Shop;Yard Work    [employer] Stable/Uncomplicated    Rehab Potential Good    PT Frequency 2x / week    PT Duration 12 weeks    PT Treatment/Interventions ADLs/Self Care Home Management;DME Instruction;Gait training;Stair training;Functional mobility training;Therapeutic activities;Therapeutic exercise;Balance training;Neuromuscular  re-education;Patient/family education;Manual techniques    PT Next Visit Plan Continue with progressive strengthening and progress balance activities as appropriate.    PT Home Exercise Plan no changes today.    Consulted and Agree with Plan of Care Patient;Family member/caregiver    Family Member Consulted Spouse- [name]           Patient will benefit from skilled therapeutic intervention in order to improve the following deficits and impairments:  Abnormal gait,Cardiopulmonary status limiting activity,Decreased activity tolerance,Decreased balance,Decreased coordination,Decreased endurance,Decreased knowledge of use of DME,Decreased mobility,Difficulty walking,Decreased strength,Hypomobility,Impaired perceived functional ability,Impaired flexibility  Visit Diagnosis: Abnormality of gait and mobility  Difficulty in walking, not elsewhere classified  Muscle weakness (generalized)  Other abnormalities of gait and mobility  Other lack of coordination     Problem List Patient Active Problem List   Diagnosis Date Noted  . Diabetes ([place]) 01/08/2016  . Hyperlipidemia 01/08/2016  . Pain in limb 01/08/2016  . Insomnia 10/27/2012  . Unspecified hereditary and idiopathic peripheral neuropathy 03/09/2012  . Lumbar stenosis 12/04/2011    Lewis Moccasin, PT 05/04/2020, 7:30 AM  [place] MAIN [facility] SERVICES [address] [place], [place], [address] Phone: [phone]   Fax:  [phone]  Name: [name] Willis Feltes Jr. MRN: [id number] Date of Birth: [birth year]

## 2020-05-08 ENCOUNTER — Ambulatory Visit: Payer: Medicare HMO

## 2020-05-08 ENCOUNTER — Other Ambulatory Visit: Payer: Self-pay

## 2020-05-08 DIAGNOSIS — R262 Difficulty in walking, not elsewhere classified: Secondary | ICD-10-CM

## 2020-05-08 DIAGNOSIS — R296 Repeated falls: Secondary | ICD-10-CM | POA: Diagnosis not present

## 2020-05-08 DIAGNOSIS — R269 Unspecified abnormalities of gait and mobility: Secondary | ICD-10-CM

## 2020-05-08 DIAGNOSIS — M6281 Muscle weakness (generalized): Secondary | ICD-10-CM

## 2020-05-08 NOTE — Therapy (Signed)
[place] MAIN [facility] SERVICES [address] [place], [place], [address] Phone: [phone]   Fax:  [phone]  Physical Therapy Treatment  Patient Details  Name: [name] Stan Goodie Jr. MRN: [id number] Date of Birth: 1945/02/13 Referring Provider (PT): Dr. Benita Stabile   Encounter Date: 05/08/2020   PT End of Session - 05/08/20 1104    Visit Number 15    Number of Visits 25    Date for PT Re-Evaluation 05/16/20    Authorization Type Initial PT eval on 02/22/2020    PT Start Time 1100    PT Stop Time 1143    PT Time Calculation (min) 43 min    Equipment Utilized During Treatment Gait belt    Activity Tolerance No increased pain;Patient limited by fatigue    Behavior During Therapy [facility] for tasks assessed/performed           Past Medical History:  Diagnosis Date  . Arthritis   . Coronary artery disease   . Depression    STARTED AFTER  OPEN HEART SURG.  . Diabetes mellitus without complication (HCC)    Borderline  . Foot pain, bilateral   . Gout   . Hypertension   . Insomnia 10/27/2012  . Lumbosacral spinal stenosis   . Myocardial infarction ([place])   . 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 [address]  . Restless leg syndrome   . Stroke [facility])    2008  RIGHT SIDE...HAD FOR A COUPLE OF DAYS AND THEN WENT AWAY  . Stroke ([place])    [place]  2008    Past Surgical History:  Procedure Laterality Date  . BACK SURGERY    . CARDIAC CATHETERIZATION    . COLONOSCOPY WITH PROPOFOL N/A 11/10/2018   Procedure: COLONOSCOPY WITH PROPOFOL;  Surgeon: Robert Bellow, MD;  Location: [facility] ENDOSCOPY;  Service: Endoscopy;  Laterality: N/A;  . CORONARY ARTERY BYPASS GRAFT     (2010 @ [facility]  . EYE SURGERY     ?? BLEPHOPLASTY  . HAND SURGERY     CRUSHING INJURY---1966  HAS ABOUT 6 SURGERIES THEN  . HAND SURGERY Right   . HERNIA REPAIR     BILATERAL  . LUMBAR FUSION  11/25/2011   POSTERIOR    There were no vitals filed for this visit.    Subjective Assessment - 05/08/20 1103    Subjective Patient denies any new issues. Reports he was tired after all the walking last session .    Patient is accompained by: Family member    Pertinent History 75 year-old male referred to PT secondary to falls and LE weakness. Patient reports progressive weakness over past couple of months with increased difficulty walking and performing ADLs- reporting several falls and assist from his wife with ADLs.  PMH: MI, CAD s/p CABG, depression, DM, gout, HTN, insomnia, LSS s/p lumbar fusion 2013, remote CVA s deficit,    Limitations Standing;Lifting;Walking;House hold activities    How long can you sit comfortably? No restrictions    How long can you stand comfortably? maybe a minute if I am lucky.    How long can you walk comfortably? Not far at all- maybe a 100 feet.    Patient Stated Goals Regain some strength so I can return to being as independent at possible.    Currently in Pain? No/denies    Pain Onset More than a month ago  Nustep  Level 3  LE only seat at 11 for 5 min; 0.16 miles (Unbilled) O2 sat= 97% and HR=83 and BORG= 4/10  Step up onto 1st step with no rail and no UE support BLE x 10 reps each- mild difficulty coordinating movement.   Seated LE strengthening:  Seated hip flex/abd (lifting foot up/over hedgehog) BLE using 2lb AW x 10 reps each Seated LAQ BLE 2lb. X 10 reps each.   Sit to stand without UE support- x 10 reps- 5/10 on BORG Patient fatigued requiring brief rest break but denied pain  Static stand on rockerboard (placed in ant/post)  - trying to maintain neutral standing without ant/post sway- hold x 20 sec x 4 trials- improved balance with each trial. Ant/post movement on rockerboard  x 20 reps x 2 trials ( increased post sway initially with min assist to keep from falling backward- improved with practice)  Static stand on rocker board (placed in lateral wt. Shifting position) - no significant   Difficulty and no LOB.    Pt educated throughout session about proper posture and technique with exercises. Improved exercise technique, movement at target joints, use of target muscles after min to mod verbal, visual, tactile cues.     Clinical Impression: Patient fatigued throughout session yet able to complete all activities with VCs and visual demo. He did present with some posterior sway with mild difficulty correcting but did improve overall with practice.He will continue to benefit from skilled PT services to improve his overall strength and balance for optimal functional mobility in the home and community                                       PT Education - 05/09/20 [phone]    Education Details specific exercise form for safety with balance    Person(s) Educated Patient    Methods Explanation;Demonstration;Tactile cues;Verbal cues    Comprehension Verbalized understanding;Tactile cues required;Returned demonstration;Verbal cues required            PT Short Term Goals - 04/19/20 1112      PT SHORT TERM GOAL #1   Title Pt will be independent with HEP in order to improve strength and balance in order to decrease fall risk and improve function at home and work.    Baseline 02/22/2020- Patient presents with no formal HEP in place. 04/19/2020- Patient reports he has been too sick over past 3 weeks. He does reports he is knowledgeable of sitting and standing LE strengthening but has not felt well enough to do them.    Time 6    Period Weeks    Status On-going    Target Date 04/04/20             PT Long Term Goals - 04/19/20 1122      PT LONG TERM GOAL #1   Title Patient will increase FOTO score to equal to or greater than 47 to demonstrate statistically significant improvement in mobility and quality of life.    Baseline 02/22/2020= 23    Time 12    Period Weeks    Status New      PT LONG TERM GOAL #2   Title Pt will decrease 5TSTS by at least 3  seconds in order to demonstrate clinically significant improvement in LE strength.    Baseline 02/22/2020- 35.15 sec. 3/31= 33.87 without UE support.    Time 12  Period Weeks    Status On-going      PT LONG TERM GOAL #3   Title Pt will decrease TUG to below 24 seconds/decrease in order to demonstrate decreased fall risk.    Baseline 02/22/2020= 31 sec using walker    Time 12    Period Weeks    Status New      PT LONG TERM GOAL #4   Title Pt will increase 10MWT by at least 0.13 m/s in order to demonstrate clinically significant improvement in community ambulation.    Baseline 02/22/2020- 0.74m/s using Front wheeled walker. 04/19/2020= 0.6 m/s    Time 12    Status New      PT LONG TERM GOAL #5   Title Patient will demonstrate ambulation > 500 on all surfaces using walker with modified independence without rest breaks or loss of balance for improved ability to walk community distances.    Baseline 02/22/2020-Patient limited to less than 100 feet of total amb distance using front wheeled walker. 04/19/2020- Patient ambulated around 320 feet today with front wheeled walker, CGA- complaining of LE soreness/fatigue and O2 sat= 98%.    Time 12    Period Weeks    Status New                 Plan - 05/08/20 1104    Clinical Impression Statement Patient fatigued throughout session yet able to complete all activities with VCs and visual demo. He did present with some posterior sway with mild difficulty correcting but did improve overall with practice.He will continue to benefit from skilled PT services to improve his overall strength and balance for optimal functional mobility in the home and community    Personal Factors and Comorbidities Age;Comorbidity 1;Comorbidity 2;Comorbidity 3+    Comorbidities Hypertension, CVA, Diabetes    Examination-Activity Limitations Bathing;Bed Mobility;Bend;Caring for Others;Carry;Dressing;Hygiene/Grooming;Lift;Locomotion Level;Reach  Overhead;Squat;Stairs;Stand;Toileting;Transfers    Examination-Participation Restrictions Cleaning;Community Activity;Driving;Laundry;Medication Management;Meal Prep;Shop;Yard Work    [employer] officer Stable/Uncomplicated    Rehab Potential Good    PT Frequency 2x / week    PT Duration 12 weeks    PT Treatment/Interventions ADLs/Self Care Home Management;DME Instruction;Gait training;Stair training;Functional mobility training;Therapeutic activities;Therapeutic exercise;Balance training;Neuromuscular re-education;Patient/family education;Manual techniques    PT Next Visit Plan Continue with progressive strengthening and progress balance activities as appropriate.    PT Home Exercise Plan no changes today.    Consulted and Agree with Plan of Care Patient;Family member/caregiver    Family Member Consulted Spouse- [name]           Patient will benefit from skilled therapeutic intervention in order to improve the following deficits and impairments:  Abnormal gait,Cardiopulmonary status limiting activity,Decreased activity tolerance,Decreased balance,Decreased coordination,Decreased endurance,Decreased knowledge of use of DME,Decreased mobility,Difficulty walking,Decreased strength,Hypomobility,Impaired perceived functional ability,Impaired flexibility  Visit Diagnosis: Abnormality of gait and mobility  Difficulty in walking, not elsewhere classified  Muscle weakness (generalized)     Problem List Patient Active Problem List   Diagnosis Date Noted  . Diabetes ([place]) 01/08/2016  . Hyperlipidemia 01/08/2016  . Pain in limb 01/08/2016  . Insomnia 10/27/2012  . Unspecified hereditary and idiopathic peripheral neuropathy 03/09/2012  . Lumbar stenosis 12/04/2011    Lewis Moccasin, PT 05/09/2020, 6:48 AM  [place] MAIN [facility] SERVICES [address] [place], [place], [address] Phone: [phone]   Fax:  [phone]  Name:  Manford Sprong Howton Jr. MRN: [id number] Date of Birth: 1945/05/28

## 2020-05-10 ENCOUNTER — Ambulatory Visit: Payer: Medicare HMO

## 2020-05-10 ENCOUNTER — Other Ambulatory Visit: Payer: Self-pay

## 2020-05-15 ENCOUNTER — Ambulatory Visit: Payer: Medicare HMO

## 2020-05-15 ENCOUNTER — Other Ambulatory Visit: Payer: Self-pay

## 2020-05-15 DIAGNOSIS — R278 Other lack of coordination: Secondary | ICD-10-CM

## 2020-05-15 DIAGNOSIS — R269 Unspecified abnormalities of gait and mobility: Secondary | ICD-10-CM

## 2020-05-15 DIAGNOSIS — R296 Repeated falls: Secondary | ICD-10-CM

## 2020-05-15 DIAGNOSIS — R262 Difficulty in walking, not elsewhere classified: Secondary | ICD-10-CM

## 2020-05-15 DIAGNOSIS — M6281 Muscle weakness (generalized): Secondary | ICD-10-CM

## 2020-05-15 NOTE — Therapy (Signed)
[place] MAIN [facility] SERVICES [address] [place], [place], [address] Phone: [phone]   Fax:  [phone]  Physical Therapy Treatment  Patient Details  Name: [name] Quezada Weekly Jr. MRN: [id number] Date of Birth: 08-06-1945 Referring Provider (PT): Dr. Benita Stabile   Encounter Date: 05/15/2020   PT End of Session - 05/15/20 1111    Visit Number 16    Number of Visits 25    Date for PT Re-Evaluation 05/16/20    Authorization Type Initial PT eval on 02/22/2020    PT Start Time 1100    PT Stop Time 1143    PT Time Calculation (min) 43 min    Equipment Utilized During Treatment Gait belt    Activity Tolerance No increased pain;Patient limited by fatigue    Behavior During Therapy [facility] for tasks assessed/performed           Past Medical History:  Diagnosis Date  . Arthritis   . Coronary artery disease   . Depression    STARTED AFTER  OPEN HEART SURG.  . Diabetes mellitus without complication (HCC)    Borderline  . Foot pain, bilateral   . Gout   . Hypertension   . Insomnia 10/27/2012  . Lumbosacral spinal stenosis   . Myocardial infarction ([place])   . 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 [address]  . Restless leg syndrome   . Stroke [facility])    2008  RIGHT SIDE...HAD FOR A COUPLE OF DAYS AND THEN WENT AWAY  . Stroke ([place])    [place]  2008    Past Surgical History:  Procedure Laterality Date  . BACK SURGERY    . CARDIAC CATHETERIZATION    . COLONOSCOPY WITH PROPOFOL N/A 11/10/2018   Procedure: COLONOSCOPY WITH PROPOFOL;  Surgeon: Robert Bellow, MD;  Location: [facility] ENDOSCOPY;  Service: Endoscopy;  Laterality: N/A;  . CORONARY ARTERY BYPASS GRAFT     (2010 @ [facility]  . EYE SURGERY     ?? BLEPHOPLASTY  . HAND SURGERY     CRUSHING INJURY---1966  HAS ABOUT 6 SURGERIES THEN  . HAND SURGERY Right   . HERNIA REPAIR     BILATERAL  . LUMBAR FUSION  11/25/2011   POSTERIOR    There were no vitals filed for this visit.    Subjective Assessment - 05/15/20 1109    Subjective Patient reports fall last week- was asleep and rolled out of bed. Patient denied any fall and wife able to assist    Patient is accompained by: Family member    Pertinent History 75 year-old male referred to PT secondary to falls and LE weakness. Patient reports progressive weakness over past couple of months with increased difficulty walking and performing ADLs- reporting several falls and assist from his wife with ADLs.  PMH: MI, CAD s/p CABG, depression, DM, gout, HTN, insomnia, LSS s/p lumbar fusion 2013, remote CVA s deficit,    Limitations Standing;Lifting;Walking;House hold activities    How long can you sit comfortably? No restrictions    How long can you stand comfortably? maybe a minute if I am lucky.    How long can you walk comfortably? Not far at all- maybe a 100 feet.    Patient Stated Goals Regain some strength so I can return to being as independent at possible.    Currently in Pain? No/denies    Pain Onset More than a month  ago                 Nustep  Level 3  LE only seat at 10 for 5 min; 0.14 miles (Unbilled) O2 sat= 99% and HR=86 and BORG= 6/10 (Hard)  Step up onto 1st step with no rail and no UE support BLE x 10 reps each- mild difficulty coordinating movement.   Seated LE strengthening:  Seated hip flex/abdadd (lifting foot up and onto Green theraband step) BLE using 2.5 lb AW x 10 reps BLE  Seated LAQ BLE 2.5 lb. X 10 reps each.  Seated hip march 2.5lb x 10 reps BLE.  Ham curl with green TB BLE x 10 reps each.   Sit to stand with UE support- x 5 reps and 5 reps without UE - 5/10 on BORG Patient fatigued today and unable to perform more than 5 reps without assistance.   Pt educated throughout session about proper posture and technique with exercises. Improved exercise technique, movement at target joints, use of target muscles after min to mod verbal, visual, tactile cues.     Clinical Impression: Patient  presented with more overall fatigue today so focused on seated LE strengthening exercises. He was able to complete the seated exercises without undo fatigue. He will continue to benefit from skilled PT services to improve his overall strength and balance for optimal functional mobility in the home and community                                  PT Education - 05/16/20 0840    Education Details Exercise form    Person(s) Educated Patient    Methods Explanation;Demonstration;Tactile cues;Verbal cues    Comprehension Verbalized understanding;Returned demonstration;Verbal cues required;Need further instruction;Tactile cues required            PT Short Term Goals - 04/19/20 1112      PT SHORT TERM GOAL #1   Title Pt will be independent with HEP in order to improve strength and balance in order to decrease fall risk and improve function at home and work.    Baseline 02/22/2020- Patient presents with no formal HEP in place. 04/19/2020- Patient reports he has been too sick over past 3 weeks. He does reports he is knowledgeable of sitting and standing LE strengthening but has not felt well enough to do them.    Time 6    Period Weeks    Status On-going    Target Date 04/04/20             PT Long Term Goals - 04/19/20 1122      PT LONG TERM GOAL #1   Title Patient will increase FOTO score to equal to or greater than 47 to demonstrate statistically significant improvement in mobility and quality of life.    Baseline 02/22/2020= 23    Time 12    Period Weeks    Status New      PT LONG TERM GOAL #2   Title Pt will decrease 5TSTS by at least 3 seconds in order to demonstrate clinically significant improvement in LE strength.    Baseline 02/22/2020- 35.15 sec. 3/31= 33.87 without UE support.    Time 12    Period Weeks    Status On-going      PT LONG TERM GOAL #3   Title Pt will decrease TUG to below 24 seconds/decrease in order to demonstrate decreased fall risk.  Baseline 02/22/2020= 31 sec using walker    Time 12    Period Weeks    Status New      PT LONG TERM GOAL #4   Title Pt will increase 10MWT by at least 0.13 m/s in order to demonstrate clinically significant improvement in community ambulation.    Baseline 02/22/2020- 0.67m/s using Front wheeled walker. 04/19/2020= 0.6 m/s    Time 12    Status New      PT LONG TERM GOAL #5   Title Patient will demonstrate ambulation > 500 on all surfaces using walker with modified independence without rest breaks or loss of balance for improved ability to walk community distances.    Baseline 02/22/2020-Patient limited to less than 100 feet of total amb distance using front wheeled walker. 04/19/2020- Patient ambulated around 320 feet today with front wheeled walker, CGA- complaining of LE soreness/fatigue and O2 sat= 98%.    Time 12    Period Weeks    Status New                 Plan - 05/15/20 1111    Clinical Impression Statement Patient presented with more overall fatigue today so focused on seated LE strengthening exercises. He was able to complete the seated exercises without undo fatigue. He will continue to benefit from skilled PT services to improve his overall strength and balance for optimal functional mobility in the home and community    Personal Factors and Comorbidities Age;Comorbidity 1;Comorbidity 2;Comorbidity 3+    Comorbidities Hypertension, CVA, Diabetes    Examination-Activity Limitations Bathing;Bed Mobility;Bend;Caring for Others;Carry;Dressing;Hygiene/Grooming;Lift;Locomotion Level;Reach Overhead;Squat;Stairs;Stand;Toileting;Transfers    Examination-Participation Restrictions Cleaning;Community Activity;Driving;Laundry;Medication Management;Meal Prep;Shop;Yard Work    [employer] Stable/Uncomplicated    Rehab Potential Good    PT Frequency 2x / week    PT Duration 12 weeks    PT Treatment/Interventions ADLs/Self Care Home Management;DME Instruction;Gait  training;Stair training;Functional mobility training;Therapeutic activities;Therapeutic exercise;Balance training;Neuromuscular re-education;Patient/family education;Manual techniques    PT Next Visit Plan Continue with progressive strengthening and progress balance activities as appropriate. Recert visit next visit.    PT Home Exercise Plan no changes today.    Consulted and Agree with Plan of Care Patient    Family Member Consulted --           Patient will benefit from skilled therapeutic intervention in order to improve the following deficits and impairments:  Abnormal gait,Cardiopulmonary status limiting activity,Decreased activity tolerance,Decreased balance,Decreased coordination,Decreased endurance,Decreased knowledge of use of DME,Decreased mobility,Difficulty walking,Decreased strength,Hypomobility,Impaired perceived functional ability,Impaired flexibility  Visit Diagnosis: Abnormality of gait and mobility  Difficulty in walking, not elsewhere classified  Muscle weakness (generalized)  Other lack of coordination  Repeated falls     Problem List Patient Active Problem List   Diagnosis Date Noted  . Diabetes ([place]) 01/08/2016  . Hyperlipidemia 01/08/2016  . Pain in limb 01/08/2016  . Insomnia 10/27/2012  . Unspecified hereditary and idiopathic peripheral neuropathy 03/09/2012  . Lumbar stenosis 12/04/2011    Lewis Moccasin, PT 05/16/2020, 8:48 AM  [place] MAIN [facility] SERVICES [address] [place], [place], [address] Phone: [phone]   Fax:  [phone]  Name: [name] Hixon Odden Jr. MRN: [id number] Date of Birth: 17-Apr-1945

## 2020-05-17 ENCOUNTER — Other Ambulatory Visit: Payer: Self-pay

## 2020-05-17 ENCOUNTER — Ambulatory Visit: Payer: Medicare HMO

## 2020-05-17 DIAGNOSIS — M6281 Muscle weakness (generalized): Secondary | ICD-10-CM

## 2020-05-17 DIAGNOSIS — R262 Difficulty in walking, not elsewhere classified: Secondary | ICD-10-CM

## 2020-05-17 DIAGNOSIS — R278 Other lack of coordination: Secondary | ICD-10-CM

## 2020-05-17 DIAGNOSIS — R296 Repeated falls: Secondary | ICD-10-CM | POA: Diagnosis not present

## 2020-05-17 DIAGNOSIS — R269 Unspecified abnormalities of gait and mobility: Secondary | ICD-10-CM

## 2020-05-17 NOTE — Therapy (Signed)
[place] MAIN [facility] SERVICES [address] [place], [place], [address] Phone: [phone]   Fax:  [phone]  Physical Therapy Treatment  Patient Details  Name: [name] Pettet Burgueno Jr. MRN: [id number] Date of Birth: October 12, 1945 Referring Provider (PT): Dr. Benita Stabile   Encounter Date: 05/17/2020   PT End of Session - 05/17/20 1111    Visit Number 17    Number of Visits 25    Date for PT Re-Evaluation 05/16/20    Authorization Type Initial PT eval on 02/22/2020    PT Start Time 1104    PT Stop Time 1144    PT Time Calculation (min) 40 min    Equipment Utilized During Treatment Gait belt    Activity Tolerance No increased pain;Patient limited by fatigue    Behavior During Therapy [facility] for tasks assessed/performed           Past Medical History:  Diagnosis Date  . Arthritis   . Coronary artery disease   . Depression    STARTED AFTER  OPEN HEART SURG.  . Diabetes mellitus without complication (HCC)    Borderline  . Foot pain, bilateral   . Gout   . Hypertension   . Insomnia 10/27/2012  . Lumbosacral spinal stenosis   . Myocardial infarction ([place])   . 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 [address]  . Restless leg syndrome   . Stroke [facility])    2008  RIGHT SIDE...HAD FOR A COUPLE OF DAYS AND THEN WENT AWAY  . Stroke ([place])    [place]  2008    Past Surgical History:  Procedure Laterality Date  . BACK SURGERY    . CARDIAC CATHETERIZATION    . COLONOSCOPY WITH PROPOFOL N/A 11/10/2018   Procedure: COLONOSCOPY WITH PROPOFOL;  Surgeon: Robert Bellow, MD;  Location: [facility] ENDOSCOPY;  Service: Endoscopy;  Laterality: N/A;  . CORONARY ARTERY BYPASS GRAFT     (2010 @ [facility]  . EYE SURGERY     ?? BLEPHOPLASTY  . HAND SURGERY     CRUSHING INJURY---1966  HAS ABOUT 6 SURGERIES THEN  . HAND SURGERY Right   . HERNIA REPAIR     BILATERAL  . LUMBAR FUSION  11/25/2011   POSTERIOR    There were no vitals filed for this visit.    Subjective Assessment - 05/17/20 1109    Subjective Patient reports feeling "like crap" When asked to be more specific- He reports "just tired with little energy."    Patient is accompained by: Family member    Pertinent History 75 year-old male referred to PT secondary to falls and LE weakness. Patient reports progressive weakness over past couple of months with increased difficulty walking and performing ADLs- reporting several falls and assist from his wife with ADLs.  PMH: MI, CAD s/p CABG, depression, DM, gout, HTN, insomnia, LSS s/p lumbar fusion 2013, remote CVA s deficit,    Limitations Standing;Lifting;Walking;House hold activities    How long can you sit comfortably? No restrictions    How long can you stand comfortably? maybe a minute if I am lucky.    How long can you walk comfortably? Not far at all- maybe a 100 feet.    Patient Stated Goals Regain some strength so I can return to being as independent at possible.    Currently in Pain? No/denies    Pain Onset More than a month ago  Interventions:   Neuromuscular re-ed:   Step up onto 6" step BLE x 10 reps each without UE support  Dual task- static stand with Dynamic cone activities- Patient able to stand on firm surface- feet narrowed- Reach across body with right arm at chest height to pick up cone then reach back across body to outstretch arm and place cone onto another surface. Right to left then left to right x 10 reps x 1 set with feet narrowed then 1 set on blue airex pad with feet widened then one last round with feet narrowed on blue airex pad. Patient exhibited unsteadiness with use of pad and 2 episodes of LOB - posteriorly requring min assist to recover and keep from falling backward.    Staic stand on 1/2 white foam roll (flat end down) x 20 sec x 5 trials- Increased loss of balance posteriorly- able to improve with practice today.  Static stand on 1/2 white foam roll (curve end down) x 20 sec  x 5 trials- difficulty maintaining balance but did improve overall with practice.       Clinical Impression: Patient was challenged with balance activities and exhibited more posterior lean/loss of balance than in previous sessions. He experienced difficulty with balance reaction today and he will continue to benefit from skilled PT services to improve his overall strength and balance for optimal functional mobility in the home and community               PT Education - 05/17/20 2307    Education Details specific exercise technique    Person(s) Educated Patient    Methods Explanation;Demonstration;Tactile cues;Verbal cues    Comprehension Verbalized understanding;Tactile cues required;Returned demonstration;Need further instruction;Verbal cues required            PT Short Term Goals - 04/19/20 1112      PT SHORT TERM GOAL #1   Title Pt will be independent with HEP in order to improve strength and balance in order to decrease fall risk and improve function at home and work.    Baseline 02/22/2020- Patient presents with no formal HEP in place. 04/19/2020- Patient reports he has been too sick over past 3 weeks. He does reports he is knowledgeable of sitting and standing LE strengthening but has not felt well enough to do them.    Time 6    Period Weeks    Status On-going    Target Date 04/04/20             PT Long Term Goals - 04/19/20 1122      PT LONG TERM GOAL #1   Title Patient will increase FOTO score to equal to or greater than 47 to demonstrate statistically significant improvement in mobility and quality of life.    Baseline 02/22/2020= 23    Time 12    Period Weeks    Status New      PT LONG TERM GOAL #2   Title Pt will decrease 5TSTS by at least 3 seconds in order to demonstrate clinically significant improvement in LE strength.    Baseline 02/22/2020- 35.15 sec. 3/31= 33.87 without UE support.    Time 12    Period Weeks    Status On-going      PT LONG  TERM GOAL #3   Title Pt will decrease TUG to below 24 seconds/decrease in order to demonstrate decreased fall risk.    Baseline 02/22/2020= 31 sec using walker    Time 12    Period  Weeks    Status New      PT LONG TERM GOAL #4   Title Pt will increase 10MWT by at least 0.13 m/s in order to demonstrate clinically significant improvement in community ambulation.    Baseline 02/22/2020- 0.17m/s using Front wheeled walker. 04/19/2020= 0.6 m/s    Time 12    Status New      PT LONG TERM GOAL #5   Title Patient will demonstrate ambulation > 500 on all surfaces using walker with modified independence without rest breaks or loss of balance for improved ability to walk community distances.    Baseline 02/22/2020-Patient limited to less than 100 feet of total amb distance using front wheeled walker. 04/19/2020- Patient ambulated around 320 feet today with front wheeled walker, CGA- complaining of LE soreness/fatigue and O2 sat= 98%.    Time 12    Period Weeks    Status New                 Plan - 05/17/20 1134    Clinical Impression Statement Patient was challenged with balance activities and exhibited more posterior lean/loss of balance than in previous sessions. He experienced difficulty with balance reaction today and he will continue to benefit from skilled PT services to improve his overall strength and balance for optimal functional mobility in the home and community    Personal Factors and Comorbidities Age;Comorbidity 1;Comorbidity 2;Comorbidity 3+    Comorbidities Hypertension, CVA, Diabetes    Examination-Activity Limitations Bathing;Bed Mobility;Bend;Caring for Others;Carry;Dressing;Hygiene/Grooming;Lift;Locomotion Level;Reach Overhead;Squat;Stairs;Stand;Toileting;Transfers    Examination-Participation Restrictions Cleaning;Community Activity;Driving;Laundry;Medication Management;Meal Prep;Shop;Yard Work    [employer] Stable/Uncomplicated    Rehab Potential Good     PT Frequency 2x / week    PT Duration 12 weeks    PT Treatment/Interventions ADLs/Self Care Home Management;DME Instruction;Gait training;Stair training;Functional mobility training;Therapeutic activities;Therapeutic exercise;Balance training;Neuromuscular re-education;Patient/family education;Manual techniques    PT Next Visit Plan Continue with progressive strengthening and progress balance activities as appropriate. Recert visit next visit.    PT Home Exercise Plan no changes today.    Consulted and Agree with Plan of Care Patient           Patient will benefit from skilled therapeutic intervention in order to improve the following deficits and impairments:  Abnormal gait,Cardiopulmonary status limiting activity,Decreased activity tolerance,Decreased balance,Decreased coordination,Decreased endurance,Decreased knowledge of use of DME,Decreased mobility,Difficulty walking,Decreased strength,Hypomobility,Impaired perceived functional ability,Impaired flexibility  Visit Diagnosis: Abnormality of gait and mobility  Difficulty in walking, not elsewhere classified  Muscle weakness (generalized)  Other lack of coordination     Problem List Patient Active Problem List   Diagnosis Date Noted  . Diabetes ([place]) 01/08/2016  . Hyperlipidemia 01/08/2016  . Pain in limb 01/08/2016  . Insomnia 10/27/2012  . Unspecified hereditary and idiopathic peripheral neuropathy 03/09/2012  . Lumbar stenosis 12/04/2011    Lewis Moccasin, PT 05/18/2020, 8:34 AM  [place] MAIN [facility] SERVICES [address] [place], [place], [address] Phone: [phone]   Fax:  [phone]  Name: Lynette Noah Lakins Jr. MRN: [id number] Date of Birth: 1945/12/09

## 2020-05-22 ENCOUNTER — Ambulatory Visit: Payer: Medicare HMO

## 2020-05-24 ENCOUNTER — Ambulatory Visit: Payer: Medicare HMO

## 2020-05-28 ENCOUNTER — Ambulatory Visit: Payer: Medicare HMO

## 2020-05-30 ENCOUNTER — Ambulatory Visit: Payer: Medicare HMO

## 2020-06-04 ENCOUNTER — Ambulatory Visit: Payer: Medicare HMO

## 2020-06-06 ENCOUNTER — Ambulatory Visit: Payer: Medicare HMO

## 2020-06-11 ENCOUNTER — Ambulatory Visit: Payer: Medicare HMO

## 2020-06-13 ENCOUNTER — Ambulatory Visit: Payer: Medicare HMO

## 2020-06-20 ENCOUNTER — Ambulatory Visit: Payer: Medicare HMO

## 2020-11-17 ENCOUNTER — Encounter: Payer: Self-pay | Admitting: Emergency Medicine

## 2020-11-17 ENCOUNTER — Inpatient Hospital Stay
Admission: EM | Admit: 2020-11-17 | Discharge: 2020-11-29 | DRG: 193 | Disposition: A | Discharge status: Skilled Nursing Facility | Payer: Medicare HMO | Attending: Internal Medicine | Admitting: Internal Medicine

## 2020-11-17 ENCOUNTER — Emergency Department: Payer: Medicare HMO

## 2020-11-17 ENCOUNTER — Other Ambulatory Visit: Payer: Self-pay

## 2020-11-17 DIAGNOSIS — R45851 Suicidal ideations: Secondary | ICD-10-CM | POA: Diagnosis present

## 2020-11-17 DIAGNOSIS — I2510 Atherosclerotic heart disease of native coronary artery without angina pectoris: Secondary | ICD-10-CM | POA: Diagnosis present

## 2020-11-17 DIAGNOSIS — R0902 Hypoxemia: Secondary | ICD-10-CM

## 2020-11-17 DIAGNOSIS — G621 Alcoholic polyneuropathy: Secondary | ICD-10-CM

## 2020-11-17 DIAGNOSIS — I10 Essential (primary) hypertension: Secondary | ICD-10-CM | POA: Diagnosis present

## 2020-11-17 DIAGNOSIS — I5033 Acute on chronic diastolic (congestive) heart failure: Secondary | ICD-10-CM | POA: Diagnosis present

## 2020-11-17 DIAGNOSIS — J9621 Acute and chronic respiratory failure with hypoxia: Secondary | ICD-10-CM | POA: Diagnosis present

## 2020-11-17 DIAGNOSIS — J9601 Acute respiratory failure with hypoxia: Secondary | ICD-10-CM

## 2020-11-17 DIAGNOSIS — G928 Other toxic encephalopathy: Secondary | ICD-10-CM | POA: Diagnosis present

## 2020-11-17 DIAGNOSIS — G9341 Metabolic encephalopathy: Secondary | ICD-10-CM

## 2020-11-17 DIAGNOSIS — Z981 Arthrodesis status: Secondary | ICD-10-CM

## 2020-11-17 DIAGNOSIS — I480 Paroxysmal atrial fibrillation: Secondary | ICD-10-CM | POA: Diagnosis present

## 2020-11-17 DIAGNOSIS — J209 Acute bronchitis, unspecified: Secondary | ICD-10-CM | POA: Diagnosis present

## 2020-11-17 DIAGNOSIS — E669 Obesity, unspecified: Secondary | ICD-10-CM | POA: Diagnosis present

## 2020-11-17 DIAGNOSIS — J9602 Acute respiratory failure with hypercapnia: Secondary | ICD-10-CM | POA: Diagnosis not present

## 2020-11-17 DIAGNOSIS — J189 Pneumonia, unspecified organism: Secondary | ICD-10-CM | POA: Diagnosis present

## 2020-11-17 DIAGNOSIS — E0822 Diabetes mellitus due to underlying condition with diabetic chronic kidney disease: Secondary | ICD-10-CM

## 2020-11-17 DIAGNOSIS — E1142 Type 2 diabetes mellitus with diabetic polyneuropathy: Secondary | ICD-10-CM | POA: Diagnosis present

## 2020-11-17 DIAGNOSIS — F331 Major depressive disorder, recurrent, moderate: Secondary | ICD-10-CM | POA: Diagnosis not present

## 2020-11-17 DIAGNOSIS — Z833 Family history of diabetes mellitus: Secondary | ICD-10-CM | POA: Diagnosis not present

## 2020-11-17 DIAGNOSIS — I252 Old myocardial infarction: Secondary | ICD-10-CM | POA: Diagnosis not present

## 2020-11-17 DIAGNOSIS — I110 Hypertensive heart disease with heart failure: Secondary | ICD-10-CM | POA: Diagnosis present

## 2020-11-17 DIAGNOSIS — Z23 Encounter for immunization: Secondary | ICD-10-CM

## 2020-11-17 DIAGNOSIS — Z6836 Body mass index (BMI) 36.0-36.9, adult: Secondary | ICD-10-CM

## 2020-11-17 DIAGNOSIS — Z87891 Personal history of nicotine dependence: Secondary | ICD-10-CM | POA: Diagnosis not present

## 2020-11-17 DIAGNOSIS — E119 Type 2 diabetes mellitus without complications: Secondary | ICD-10-CM

## 2020-11-17 DIAGNOSIS — G629 Polyneuropathy, unspecified: Secondary | ICD-10-CM

## 2020-11-17 DIAGNOSIS — A419 Sepsis, unspecified organism: Secondary | ICD-10-CM

## 2020-11-17 DIAGNOSIS — Z7189 Other specified counseling: Secondary | ICD-10-CM

## 2020-11-17 DIAGNOSIS — R4182 Altered mental status, unspecified: Secondary | ICD-10-CM

## 2020-11-17 DIAGNOSIS — E1169 Type 2 diabetes mellitus with other specified complication: Secondary | ICD-10-CM | POA: Diagnosis not present

## 2020-11-17 DIAGNOSIS — Z20822 Contact with and (suspected) exposure to covid-19: Secondary | ICD-10-CM | POA: Diagnosis present

## 2020-11-17 DIAGNOSIS — Z8673 Personal history of transient ischemic attack (TIA), and cerebral infarction without residual deficits: Secondary | ICD-10-CM

## 2020-11-17 DIAGNOSIS — R0602 Shortness of breath: Secondary | ICD-10-CM

## 2020-11-17 DIAGNOSIS — Z66 Do not resuscitate: Secondary | ICD-10-CM | POA: Diagnosis present

## 2020-11-17 DIAGNOSIS — G250 Essential tremor: Secondary | ICD-10-CM | POA: Diagnosis present

## 2020-11-17 DIAGNOSIS — N1832 Diabetes mellitus due to underlying condition with diabetic chronic kidney disease: Secondary | ICD-10-CM

## 2020-11-17 DIAGNOSIS — Z951 Presence of aortocoronary bypass graft: Secondary | ICD-10-CM

## 2020-11-17 DIAGNOSIS — F32A Depression, unspecified: Secondary | ICD-10-CM | POA: Diagnosis present

## 2020-11-17 DIAGNOSIS — E785 Hyperlipidemia, unspecified: Secondary | ICD-10-CM | POA: Diagnosis present

## 2020-11-17 DIAGNOSIS — J9622 Acute and chronic respiratory failure with hypercapnia: Secondary | ICD-10-CM | POA: Diagnosis present

## 2020-11-17 DIAGNOSIS — R4189 Other symptoms and signs involving cognitive functions and awareness: Secondary | ICD-10-CM | POA: Diagnosis present

## 2020-11-17 DIAGNOSIS — Z515 Encounter for palliative care: Secondary | ICD-10-CM | POA: Diagnosis not present

## 2020-11-17 LAB — CBC
HCT: 39.3 % (ref 39.0–52.0)
Hemoglobin: 13.3 g/dL (ref 13.0–17.0)
MCH: 33.1 pg (ref 26.0–34.0)
MCHC: 33.8 g/dL (ref 30.0–36.0)
MCV: 97.8 fL (ref 80.0–100.0)
Platelets: 199 10*3/uL (ref 150–400)
RBC: 4.02 MIL/uL — ABNORMAL LOW (ref 4.22–5.81)
RDW: 13.2 % (ref 11.5–15.5)
WBC: 9.7 10*3/uL (ref 4.0–10.5)
nRBC: 0.2 % (ref 0.0–0.2)

## 2020-11-17 LAB — URINALYSIS, COMPLETE (UACMP) WITH MICROSCOPIC
Bacteria, UA: NONE SEEN
Bilirubin Urine: NEGATIVE
Glucose, UA: NEGATIVE mg/dL
Ketones, ur: NEGATIVE mg/dL
Leukocytes,Ua: NEGATIVE
Nitrite: NEGATIVE
Protein, ur: 30 mg/dL — AB
RBC / HPF: >50 RBC/hpf — ABNORMAL HIGH (ref 0–5)
Specific Gravity, Urine: 1.015 (ref 1.005–1.030)
pH: 6 (ref 5.0–8.0)

## 2020-11-17 LAB — COMPREHENSIVE METABOLIC PANEL
ALT: 21 U/L (ref 0–44)
AST: 24 U/L (ref 15–41)
Albumin: 2.9 g/dL — ABNORMAL LOW (ref 3.5–5.0)
Alkaline Phosphatase: 32 U/L — ABNORMAL LOW (ref 38–126)
Anion gap: 10 (ref 5–15)
BUN: 10 mg/dL (ref 8–23)
CO2: 26 mmol/L (ref 22–32)
Calcium: 9.7 mg/dL (ref 8.9–10.3)
Chloride: 99 mmol/L (ref 98–111)
Creatinine, Ser: 0.88 mg/dL (ref 0.61–1.24)
GFR, Estimated: >60 mL/min (ref >60)
Glucose, Bld: 153 mg/dL — ABNORMAL HIGH (ref 70–99)
Potassium: 3.2 mmol/L — ABNORMAL LOW (ref 3.5–5.1)
Sodium: 135 mmol/L (ref 135–145)
Total Bilirubin: 0.9 mg/dL (ref 0.3–1.2)
Total Protein: 6.4 g/dL — ABNORMAL LOW (ref 6.5–8.1)

## 2020-11-17 LAB — LACTIC ACID, PLASMA
Lactic Acid, Venous: 2.3 mmol/L (ref 0.5–1.9)
Lactic Acid, Venous: 1.4 mmol/L (ref 0.5–1.9)

## 2020-11-17 LAB — RESP PANEL BY RT-PCR (FLU A&B, COVID) ARPGX2
Influenza A by PCR: NEGATIVE
Influenza B by PCR: NEGATIVE
SARS Coronavirus 2 by RT PCR: NEGATIVE

## 2020-11-17 LAB — BRAIN NATRIURETIC PEPTIDE: B Natriuretic Peptide: 331 pg/mL — ABNORMAL HIGH (ref 0.0–100.0)

## 2020-11-17 LAB — DIGOXIN LEVEL: Digoxin Level: 0.2 ng/mL — ABNORMAL LOW (ref 0.8–2.0)

## 2020-11-17 LAB — PROCALCITONIN: Procalcitonin: 0.33 ng/mL

## 2020-11-17 LAB — TROPONIN I (HIGH SENSITIVITY): Troponin I (High Sensitivity): 13 ng/L (ref <18)

## 2020-11-17 MED ORDER — FOLIC ACID 1 MG PO TABS
1.0000 mg | ORAL_TABLET | Freq: Every day | ORAL | Status: DC
  Administered 2020-11-18 – 2020-11-29 (×12): 1 mg via ORAL
  Filled 2020-11-17 (×12): qty 1

## 2020-11-17 MED ORDER — THIAMINE HCL 100 MG PO TABS: 100.0000 mg | ORAL_TABLET | Freq: Every day | ORAL | Status: DC

## 2020-11-17 MED ORDER — LACTATED RINGERS IV SOLN: INTRAVENOUS | Status: AC

## 2020-11-17 MED ORDER — SODIUM CHLORIDE 0.9 % IV SOLN
2.0000 g | INTRAVENOUS | Status: AC
  Administered 2020-11-17 – 2020-11-21 (×5): 2 g via INTRAVENOUS
  Filled 2020-11-17 (×4): qty 20
  Filled 2020-11-17: qty 2

## 2020-11-17 MED ORDER — ENOXAPARIN SODIUM 40 MG/0.4ML IJ SOSY: 40.0000 mg | PREFILLED_SYRINGE | INTRAMUSCULAR | Status: DC

## 2020-11-17 MED ORDER — HALOPERIDOL LACTATE 5 MG/ML IJ SOLN: 2.0000 mg | Freq: Four times a day (QID) | INTRAMUSCULAR | Status: AC | PRN

## 2020-11-17 MED ORDER — ACETAMINOPHEN 650 MG RE SUPP
650.0000 mg | Freq: Four times a day (QID) | RECTAL | Status: DC | PRN
  Filled 2020-11-17: qty 1

## 2020-11-17 MED ORDER — POTASSIUM CHLORIDE CRYS ER 20 MEQ PO TBCR
40.0000 meq | EXTENDED_RELEASE_TABLET | Freq: Once | ORAL | Status: AC
  Administered 2020-11-17: 40 meq via ORAL
  Filled 2020-11-17: qty 2

## 2020-11-17 MED ORDER — HYDROCODONE-ACETAMINOPHEN 5-325 MG PO TABS
1.0000 | ORAL_TABLET | Freq: Four times a day (QID) | ORAL | Status: DC | PRN
Start: 2020-11-17 — End: 2020-11-18

## 2020-11-17 MED ORDER — POTASSIUM CHLORIDE 10 MEQ/100ML IV SOLN
10.0000 meq | INTRAVENOUS | Status: AC
Start: 2020-11-17 — End: 2020-11-17
  Administered 2020-11-17 (×3): 10 meq via INTRAVENOUS
  Filled 2020-11-17 (×3): qty 100

## 2020-11-17 MED ORDER — LORAZEPAM 1 MG PO TABS: 1.0000 mg | ORAL_TABLET | ORAL | Status: AC | PRN

## 2020-11-17 MED ORDER — APIXABAN 5 MG PO TABS
5.0000 mg | ORAL_TABLET | Freq: Two times a day (BID) | ORAL | Status: DC
  Administered 2020-11-18 – 2020-11-29 (×23): 5 mg via ORAL
  Filled 2020-11-17 (×24): qty 1

## 2020-11-17 MED ORDER — LORAZEPAM 2 MG/ML IJ SOLN: 1.0000 mg | INTRAMUSCULAR | Status: AC | PRN

## 2020-11-17 MED ORDER — ACETAMINOPHEN 325 MG PO TABS: 650.0000 mg | ORAL_TABLET | Freq: Four times a day (QID) | ORAL | Status: DC | PRN

## 2020-11-17 MED ORDER — ONDANSETRON HCL 4 MG PO TABS: 4.0000 mg | ORAL_TABLET | Freq: Four times a day (QID) | ORAL | Status: DC | PRN

## 2020-11-17 MED ORDER — HALOPERIDOL LACTATE 5 MG/ML IJ SOLN: 5.0000 mg | Freq: Four times a day (QID) | INTRAMUSCULAR | Status: DC | PRN

## 2020-11-17 MED ORDER — SIMVASTATIN 20 MG PO TABS
40.0000 mg | ORAL_TABLET | Freq: Every day | ORAL | Status: DC
  Administered 2020-11-18 – 2020-11-28 (×11): 40 mg via ORAL
  Filled 2020-11-17 (×11): qty 2

## 2020-11-17 MED ORDER — LACTATED RINGERS IV BOLUS
1000.0000 mL | Freq: Once | INTRAVENOUS | Status: AC
  Administered 2020-11-17: 1000 mL via INTRAVENOUS

## 2020-11-17 MED ORDER — FENOFIBRATE 160 MG PO TABS
160.0000 mg | ORAL_TABLET | Freq: Every day | ORAL | Status: DC
  Administered 2020-11-18 – 2020-11-29 (×12): 160 mg via ORAL
  Filled 2020-11-17 (×12): qty 1

## 2020-11-17 MED ORDER — ONDANSETRON HCL 4 MG/2ML IJ SOLN: 4.0000 mg | Freq: Four times a day (QID) | INTRAMUSCULAR | Status: DC | PRN

## 2020-11-17 MED ORDER — SODIUM CHLORIDE 0.9 % IV SOLN
500.0000 mg | INTRAVENOUS | Status: AC
  Administered 2020-11-17 – 2020-11-21 (×5): 500 mg via INTRAVENOUS
  Filled 2020-11-17 (×5): qty 500

## 2020-11-17 MED ORDER — SENNOSIDES-DOCUSATE SODIUM 8.6-50 MG PO TABS: 1.0000 | ORAL_TABLET | Freq: Every evening | ORAL | Status: DC | PRN

## 2020-11-17 MED ORDER — DULOXETINE HCL 30 MG PO CPEP
30.0000 mg | ORAL_CAPSULE | Freq: Two times a day (BID) | ORAL | Status: DC
  Administered 2020-11-18 – 2020-11-29 (×23): 30 mg via ORAL
  Filled 2020-11-17 (×24): qty 1

## 2020-11-17 MED ORDER — ALBUTEROL SULFATE (2.5 MG/3ML) 0.083% IN NEBU
2.5000 mg | INHALATION_SOLUTION | Freq: Four times a day (QID) | RESPIRATORY_TRACT | Status: DC
  Administered 2020-11-17 – 2020-11-28 (×42): 2.5 mg via RESPIRATORY_TRACT
  Filled 2020-11-17 (×42): qty 3

## 2020-11-17 MED ORDER — FOLIC ACID 1 MG PO TABS: 1.0000 mg | ORAL_TABLET | Freq: Every day | ORAL | Status: DC

## 2020-11-17 MED ORDER — BISACODYL 5 MG PO TBEC
5.0000 mg | DELAYED_RELEASE_TABLET | Freq: Every day | ORAL | Status: DC | PRN
Start: 2020-11-17 — End: 2020-11-19

## 2020-11-17 MED ORDER — ADULT MULTIVITAMIN W/MINERALS CH
1.0000 | ORAL_TABLET | Freq: Every day | ORAL | Status: DC
  Administered 2020-11-18 – 2020-11-29 (×12): 1 via ORAL
  Filled 2020-11-17 (×12): qty 1

## 2020-11-17 MED ORDER — METOPROLOL SUCCINATE ER 50 MG PO TB24: 100.0000 mg | ORAL_TABLET | Freq: Two times a day (BID) | ORAL | Status: DC

## 2020-11-17 MED ORDER — VITAMIN D 25 MCG (1000 UNIT) PO TABS
1000.0000 [IU] | ORAL_TABLET | Freq: Every day | ORAL | Status: DC
  Administered 2020-11-18 – 2020-11-22 (×5): 1000 [IU] via ORAL
  Filled 2020-11-17 (×5): qty 1

## 2020-11-17 MED ORDER — FUROSEMIDE 10 MG/ML IJ SOLN
40.0000 mg | Freq: Once | INTRAMUSCULAR | Status: AC
  Administered 2020-11-17: 40 mg via INTRAVENOUS
  Filled 2020-11-17: qty 4

## 2020-11-17 MED ORDER — GUAIFENESIN ER 600 MG PO TB12
1200.0000 mg | ORAL_TABLET | Freq: Two times a day (BID) | ORAL | Status: DC
  Administered 2020-11-18 – 2020-11-21 (×7): 1200 mg via ORAL
  Filled 2020-11-17 (×10): qty 2

## 2020-11-17 NOTE — Assessment & Plan Note (Signed)
Continue fenofibrate and metoprolol as at home. Troponin is pending. EKG shows atrial fibrillation which is chronic with a controlled heart rate. Monitor on telemetry.
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
## 2020-11-17 NOTE — Assessment & Plan Note (Deleted)
The patient is on metformin only at home. Will check HbA1c.

## 2020-11-17 NOTE — Assessment & Plan Note (Signed)
Continue fenofibrate 
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
## 2020-11-17 NOTE — Sepsis Progress Note (Signed)
Elink is monitoring this code sepsis 

## 2020-11-17 NOTE — Assessment & Plan Note (Signed)
The patient is on metformin only at home. Will check HbA1c.

## 2020-11-17 NOTE — Assessment & Plan Note (Signed)
Continue metoprolol as at home. Hold irbesartan until warranted. Monitor.

## 2020-11-17 NOTE — Assessment & Plan Note (Signed)
The patient has a wet sounding cough that is hacking. It occurs with deep respirations and speech. He is not producing a lot of sputum. CXR demonstrates abnormalities at the left base, but otherwise no pulmonary edema or infiltrate. Pneumonia seems unlikely with negative procalcitonin of 0.33, No WBC, and no fevers. Will treat this as acute bronchitis with azithromycin to cover for atypical organisms. The patient will receive nebulizer treatments and prednisone. Monitor.
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
## 2020-11-17 NOTE — Consult Note (Signed)
CODE SEPSIS - PHARMACY COMMUNICATION  **Broad Spectrum Antibiotics should be administered within 1 hour of Sepsis diagnosis**  Time Code Sepsis Called/Page Received: 7366  Antibiotics Ordered:  Azithromycin and cefepime  Time of 1st antibiotic administration: 1641  Additional action taken by pharmacy: none  If necessary, Name of Provider/Nurse Contacted: n/a    Laird Runnion Rodriguez-Guzman PharmD, BCPS 11/17/2020 4:41 PM

## 2020-11-17 NOTE — ED Triage Notes (Signed)
Pt via EMS from home. Pt stays at home with his wife. Pt has been having a decrease in mobility and multiple fall since last month. Pt has a hx of neuropathy. Also c/o productive cough and weakness. Pt O2 in the low 90s, pt placed on 2L and O2 now 94% by EMS. Pt is alert but confused. Denies pain.

## 2020-11-17 NOTE — Assessment & Plan Note (Signed)
Multifactorial. Patient has DM and also drinks a fair amount. This has resulted in a great deal of difficulty with walking and standing due to pain in his feet.

## 2020-11-17 NOTE — Assessment & Plan Note (Signed)
Although the patient presents without fevers, elevation of procalcitonin or WBC, his CXR finding of a possible infiltrate [place] indicate early pneumonia. In cases such as these it would not be unusual for procalcitonin to increased over the next day or two as infection evolves. Will start the patient on IV rocephin and azithromycin empirically. These should be stopped if there is not further evidence of development of pneumonia in the next couple of days.

## 2020-11-17 NOTE — ED Notes (Signed)
CRITICAL VALUE STICKER  CRITICAL VALUE: lactic acid 2.3  RECEIVER (on-site recipient of call): Ocie Doyne, RN  DATE & TIME NOTIFIED: 11/17/2020 1700  MESSENGER (representative from lab):Colletta [place], RN  MD NOTIFIED: [place]: 1700  RESPONSE:
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
## 2020-11-18 ENCOUNTER — Inpatient Hospital Stay
Admit: 2020-11-18 | Discharge: 2020-11-18 | Disposition: A | Discharge status: Home / Self Care | Payer: Medicare HMO | Attending: Internal Medicine | Admitting: Internal Medicine

## 2020-11-18 ENCOUNTER — Inpatient Hospital Stay: Payer: Medicare HMO

## 2020-11-18 DIAGNOSIS — J189 Pneumonia, unspecified organism: Secondary | ICD-10-CM | POA: Diagnosis not present

## 2020-11-18 DIAGNOSIS — J9602 Acute respiratory failure with hypercapnia: Secondary | ICD-10-CM | POA: Diagnosis present

## 2020-11-18 DIAGNOSIS — J9601 Acute respiratory failure with hypoxia: Secondary | ICD-10-CM | POA: Diagnosis not present

## 2020-11-18 DIAGNOSIS — G928 Other toxic encephalopathy: Secondary | ICD-10-CM | POA: Diagnosis not present

## 2020-11-18 DIAGNOSIS — J209 Acute bronchitis, unspecified: Secondary | ICD-10-CM | POA: Diagnosis not present

## 2020-11-18 LAB — COMPREHENSIVE METABOLIC PANEL
ALT: 19 U/L (ref 0–44)
AST: 20 U/L (ref 15–41)
Albumin: 2.8 g/dL — ABNORMAL LOW (ref 3.5–5.0)
Alkaline Phosphatase: 33 U/L — ABNORMAL LOW (ref 38–126)
Anion gap: 9 (ref 5–15)
BUN: 11 mg/dL (ref 8–23)
CO2: 30 mmol/L (ref 22–32)
Calcium: 8.9 mg/dL (ref 8.9–10.3)
Chloride: 98 mmol/L (ref 98–111)
Creatinine, Ser: 0.73 mg/dL (ref 0.61–1.24)
GFR, Estimated: >60 mL/min (ref >60)
Glucose, Bld: 126 mg/dL — ABNORMAL HIGH (ref 70–99)
Potassium: 3.2 mmol/L — ABNORMAL LOW (ref 3.5–5.1)
Sodium: 137 mmol/L (ref 135–145)
Total Bilirubin: 0.8 mg/dL (ref 0.3–1.2)
Total Protein: 6 g/dL — ABNORMAL LOW (ref 6.5–8.1)

## 2020-11-18 LAB — HEMOGLOBIN A1C
Hgb A1c MFr Bld: 6 % — ABNORMAL HIGH (ref 4.8–5.6)
Mean Plasma Glucose: 125.5 mg/dL

## 2020-11-18 LAB — POTASSIUM: Potassium: 3.4 mmol/L — ABNORMAL LOW (ref 3.5–5.1)

## 2020-11-18 LAB — CBC
HCT: 35.6 % — ABNORMAL LOW (ref 39.0–52.0)
Hemoglobin: 12.4 g/dL — ABNORMAL LOW (ref 13.0–17.0)
MCH: 33.9 pg (ref 26.0–34.0)
MCHC: 34.8 g/dL (ref 30.0–36.0)
MCV: 97.3 fL (ref 80.0–100.0)
Platelets: 162 10*3/uL (ref 150–400)
RBC: 3.66 MIL/uL — ABNORMAL LOW (ref 4.22–5.81)
RDW: 13.5 % (ref 11.5–15.5)
WBC: 6.5 10*3/uL (ref 4.0–10.5)
nRBC: 0 % (ref 0.0–0.2)

## 2020-11-18 LAB — PROTIME-INR
INR: 1.2 (ref 0.8–1.2)
Prothrombin Time: 15.5 seconds — ABNORMAL HIGH (ref 11.4–15.2)

## 2020-11-18 LAB — BRAIN NATRIURETIC PEPTIDE: B Natriuretic Peptide: 421.5 pg/mL — ABNORMAL HIGH (ref 0.0–100.0)

## 2020-11-18 LAB — PROCALCITONIN: Procalcitonin: 0.21 ng/mL

## 2020-11-18 LAB — TROPONIN I (HIGH SENSITIVITY): Troponin I (High Sensitivity): 14 ng/L (ref <18)

## 2020-11-18 MED ORDER — INFLUENZA VAC A&B SA ADJ QUAD 0.5 ML IM PRSY
0.5000 mL | PREFILLED_SYRINGE | Freq: Once | INTRAMUSCULAR | Status: AC
  Administered 2020-11-28: 0.5 mL via INTRAMUSCULAR
  Filled 2020-11-18: qty 0.5

## 2020-11-18 MED ORDER — POTASSIUM CHLORIDE CRYS ER 20 MEQ PO TBCR
40.0000 meq | EXTENDED_RELEASE_TABLET | Freq: Once | ORAL | Status: AC
  Administered 2020-11-18: 40 meq via ORAL
  Filled 2020-11-18: qty 2

## 2020-11-18 MED ORDER — FUROSEMIDE 10 MG/ML IJ SOLN
40.0000 mg | INTRAMUSCULAR | Status: AC
  Administered 2020-11-18: 40 mg via INTRAVENOUS
  Filled 2020-11-18: qty 4

## 2020-11-18 MED ORDER — SODIUM CHLORIDE 0.9 % IV SOLN
INTRAVENOUS | Status: DC | PRN
  Administered 2020-11-18: 10 mL/h via INTRAVENOUS

## 2020-11-18 MED ORDER — PERFLUTREN LIPID MICROSPHERE
1.0000 mL | INTRAVENOUS | Status: AC | PRN
  Administered 2020-11-18: 3 mL via INTRAVENOUS
  Filled 2020-11-18: qty 10

## 2020-11-18 NOTE — Evaluation (Signed)
Clinical/Bedside Swallow Evaluation Patient Details  Name: [name] Squier Fout Jr. MRN: [id number] Date of Birth: 1946/01/04  Today's Date: 11/18/2020 Time: SLP Start Time (ACUTE ONLY): 1138 SLP Stop Time (ACUTE ONLY): 1155 SLP Time Calculation (min) (ACUTE ONLY): 17 min  Past Medical History:  Past Medical History:  Diagnosis Date   Arthritis    Coronary artery disease    Depression    STARTED AFTER  OPEN HEART SURG.   Diabetes mellitus without complication ([place])    Borderline   Foot pain, bilateral    Gout    Hypertension    Insomnia 10/27/2012   Lumbosacral spinal stenosis    Myocardial infarction ([place])    Neuropathy    FIBRO        SEES  DR. Raliegh Ip  WILLIS   Restless leg syndrome    Stroke [facility])    2008  RIGHT SIDE...HAD FOR A COUPLE OF DAYS AND THEN WENT AWAY   Stroke ([place])    WENT TO [place]  2008   Past Surgical History:  Past Surgical History:  Procedure Laterality Date   BACK SURGERY     CARDIAC CATHETERIZATION     COLONOSCOPY WITH PROPOFOL N/A 11/10/2018   Procedure: COLONOSCOPY WITH PROPOFOL;  Surgeon: Robert Bellow, MD;  Location: [facility];  Service: Endoscopy;  Laterality: N/A;   CORONARY ARTERY BYPASS GRAFT     (2010 @ [facility]   EYE SURGERY     ?? BLEPHOPLASTY   HAND SURGERY     CRUSHING INJURY---1966  HAS ABOUT 6 SURGERIES THEN   HAND SURGERY Right    HERNIA REPAIR     BILATERAL   LUMBAR FUSION  11/25/2011   POSTERIOR   HPI:  Per Physician's H&P "The patient is a 75 yr old man who according to his wife has intermittent episodes of confusion and disorientation at baseline. He has been increasingly short of breath with a progressively worsening cough for 4 days. He has been refusing to go to the doctor and accused her of trying to kill him last night when she wanted to call EMS. He has not been eating or drinking well. She also states that although he has been taking his usual medications for the last couple of days, that he had gone for a few days without  taking them. He has a loose sounding cough that is worse with speech, deep respirations, or activity. He is very inactive at baseline due to peripheral neuropathy in his feet that gives him a great deal of pain. He has not smoked in years. He does have a "few drinks a day" per his wife.      He has a past medical history significant for CAD, PAF, DM II, Peripheral neuropathy, Hypertension, and history of stroke.      In the ED he was found to have be hypertensive with SBP of 176, he was in atrial fibrillation with a HR of 92, and a RR of 20. He is saturating in the mid-nineties on 2L O2 by nasal cannula. SaO2 without O2 is not documented. CXR demonstrated a mild non-specific left lower lobe interstitial opacity which Carino represent early pneumonia. Labwork has demonstrated a normal WBC at 9.2. Procalcitonin is normal at 0.33. Potassium is low at 3.2. Albumin is low at 2.9. BNP is elevated at 331.0. There is no previous value available for comparison. Lactic acid initially was 2.3, but came down to 1.4 with IV fluids.      The patient denies  fevers, chills, nausea, vomiting, abdominal pain, increasing abdominal girth, focal neurological changes, headache, chest pain, joint swelling, peripheral edema, diarrhea. He does tend to be constipated. No rashes, sores, or lesions."    Assessment / Plan / Recommendation  Clinical Impression  Pt seen for clincial swallowing evaluation. Pt lethargic, but able to rouse for safe PO intake. Per wife, pt with coughing episode prior to breakfast this AM which continued throughout breakfast. Wife suspected pt's breathing treatment "broke up some of his congestion." Wife denies pt with difficulty swallowing at baseline. Episode this AM prompted SLP consult.  Pt given trials of solid (1 cracker), pureed (~2 oz). and water (~4 oz total via single and consective straw sips). Oral phase was functional for consistencies given. Pharyngeal swallow appeared functional with no overt or  suble s/sx pharyngeal dysphagia. To palpation, timely swallow initiation and seemingly adequate laryngeal elevation. No change to vocal quality across trials.   Per chart review, temp and WBC WNL. CXR 11/18/20 "Cardiomegaly. Central pulmonary vessels are more prominent which Shelp be due to poor inspiration or suggest CHF. Increased interstitial markings are seen in parahilar regions and lower lung fields, more so on the left side suggesting asymmetric pulmonary edema or underlying interstitial pneumonitis. There is no focal pulmonary consolidation or significant pleural effusion."  Recommend continuation of current diet with standard aspiration precautions and meds crushed in applesauce for ease of swallowing. Pt would benefit from assistance with feeding given AMS/lethargy.  Pt, pt's wife, and RN made aware of results, recommendations, and POC. ?full understanding by pt. Wife verbalized understanding/agreement.  SLP to sign off as pt has no acute SLP needs at this time. Please reconsult should pt exhibit increased difficulty swallowing or changes to lung status concerning for aspiration PNA.  SLP Visit Diagnosis: Dysphagia, unspecified (R13.10)    Aspiration Risk  Mild aspiration risk    Diet Recommendation Regular;Thin liquid   Medication Administration: Crushed with puree Supervision: Staff to assist with self feeding Compensations: Small sips/bites;Slow rate (alert/awake) Postural Changes: Seated upright at 90 degrees    Other  Recommendations Oral Care Recommendations: Oral care BID    Recommendations for follow up therapy are one component of a multi-disciplinary discharge planning process, led by the attending physician.  Recommendations Ivins be updated based on patient status, additional functional criteria and insurance authorization.  Follow up Recommendations None        Swallow Study   General Date of Onset: 11/17/20 HPI: Per Physician's H&P "The patient is a 75 yr old man  who according to his wife has intermittent episodes of confusion and disorientation at baseline. He has been increasingly short of breath with a progressively worsening cough for 4 days. He has been refusing to go to the doctor and accused her of trying to kill him last night when she wanted to call EMS. He has not been eating or drinking well. She also states that although he has been taking his usual medications for the last couple of days, that he had gone for a few days without taking them. He has a loose sounding cough that is worse with speech, deep respirations, or activity. He is very inactive at baseline due to peripheral neuropathy in his feet that gives him a great deal of pain. He has not smoked in years. He does have a "few drinks a day" per his wife.      He has a past medical history significant for CAD, PAF, DM II, Peripheral neuropathy, Hypertension, and history  of stroke.      In the ED he was found to have be hypertensive with SBP of 176, he was in atrial fibrillation with a HR of 92, and a RR of 20. He is saturating in the mid-nineties on 2L O2 by nasal cannula. SaO2 without O2 is not documented. CXR demonstrated a mild non-specific left lower lobe interstitial opacity which Bley represent early pneumonia. Labwork has demonstrated a normal WBC at 9.2. Procalcitonin is normal at 0.33. Potassium is low at 3.2. Albumin is low at 2.9. BNP is elevated at 331.0. There is no previous value available for comparison. Lactic acid initially was 2.3, but came down to 1.4 with IV fluids.      The patient denies fevers, chills, nausea, vomiting, abdominal pain, increasing abdominal girth, focal neurological changes, headache, chest pain, joint swelling, peripheral edema, diarrhea. He does tend to be constipated. No rashes, sores, or lesions." Type of Study: Bedside Swallow Evaluation Previous Swallow Assessment: unknown Diet Prior to this Study: Regular;Thin liquids Temperature Spikes Noted: No Respiratory  Status: Nasal cannula (2L/min) History of Recent Intubation: No Behavior/Cognition: Lethargic/Drowsy Oral Cavity Assessment: Within Functional Limits Oral Cavity - Dentition: Adequate natural dentition Self-Feeding Abilities: Needs assist;Total assist (due to lethargy/AMS; wife reports pt typically feeds self) Patient Positioning: Upright in bed Baseline Vocal Quality: Normal Volitional Cough: Strong Volitional Swallow: Unable to elicit (due to lethargy)    Oral/Motor/Sensory Function Overall Oral Motor/Sensory Function: Within functional limits      Thin Liquid Thin Liquid: Within functional limits Presentation: Straw Other Comments: single and sequental sips; ~4oz total    Puree Puree: Within functional limits Presentation: Spoon Other Comments: ~2 oz applesauce via teaspoons   Solid    Cherrie Gauze, M.S., CCC-SLP    Solid: Within functional limits Other Comments: x1 cracker      Quintella Baton 11/18/2020,12:23 PM

## 2020-11-18 NOTE — ED Notes (Signed)
RN to bedside to find pt soaked in urine. Primafit had come off. Rn and other staff helped change patient linens and gown.

## 2020-11-18 NOTE — Assessment & Plan Note (Signed)
Continue Rocephin and Zithromax for now

## 2020-11-18 NOTE — ED Notes (Signed)
RN in room, pts SpO2 dropped to 88% on 2 liter via [place]. Pt O2 increased to 3 liters, with sats improving to 93% but then dropping back to 89-90%.  Pt wife at bedside feeding pt. Pt wife states that while eating pt had a coughing spell and they had to stop feeding for a few minutes. Pt wife advised not to give pt anything else by mouth due to concern for aspiration. Will informed Dr. Manuella Ghazi and see if we need to get x-ray to check for aspiration.

## 2020-11-18 NOTE — Hospital Course (Addendum)
75 yr old man with a known history of CAD, PAF, DM II, Peripheral neuropathy, Hypertension, and history of stroke who according to his wife has intermittent episodes of confusion and disorientation at baseline. He has been increasingly short of breath with a progressively worsening cough for 4 days.  He is admitted for acute hypoxic respiratory failure and acute metabolic encephalopathy  34/37 -possible aspiration event.  Oxygen requirement went up to 4 L.  Giving 40 of Lasix based on the chest x-ray showing pulmonary vascular congestion. 10/31: Weaned off to 2.5 L oxygen.  Pulmonary seen transferred to any MedSurg giving additional 40 mg of Lasix today PT, OT consult 11/1: PT,OT recommends SNF, palliative care consult

## 2020-11-18 NOTE — ED Notes (Signed)
Pt in bed sleeping at this time. Respirations are equal and unlabored at this time. Pt wife at bedside. Pt wife denies any needs at this time.

## 2020-11-18 NOTE — ED Notes (Signed)
Complete bed change, new primo applied.

## 2020-11-18 NOTE — Progress Notes (Signed)
Progress Note    Natnael Biederman Cutsforth Jr.   LDJ:[id number]  DOB: Johal 02, 1947  DOA: 11/17/2020     1 Date of Service: 11/18/2020   Clinical Course  75 yr old man with a known history of CAD, PAF, DM II, Peripheral neuropathy, Hypertension, and history of stroke who according to his wife has intermittent episodes of confusion and disorientation at baseline. He has been increasingly short of breath with a progressively worsening cough for 4 days.  He is admitted for acute hypoxic respiratory failure and acute metabolic encephalopathy  03/00 -possible aspiration event.  Oxygen requirement went up to 4 L.  Giving 40 of Lasix based on the chest x-ray showing pulmonary vascular congestion.  Pulmonary consult.  Admit change to stepdown   Assessment and Plan * Toxic metabolic encephalopathy Likely multifactorial.  Wife reports patient has not been sleeping and or eating well for over a week.  He is barely able to walk with cane or walker and has poor baseline functional status  Acute respiratory failure with hypoxia and hypercapnia (HCC) He was on 2 L oxygen and had to be on 4 to maintain oxygen saturations above 85% while in the ED.  Chest x-ray shows possible pulmonary vascular congestion.  We will give Lasix 40 mg once IV and obtain pulmonary consultation.  We will admit him to stepdown and start MedSurg as he looks high risk for decompensation.  Wife was agreeable to make him DNR  Community acquired pneumonia Empiric Rocephin and Zithromax.  Chest x-ray not really very suggestive of pneumonia and seems more fluid overload  Acute bronchitis due to infection Continue Rocephin and Zithromax for now  Hyperlipidemia Continue fenofibrate  Diabetes (HCC) Hemoglobin A1c of 6.  Blood sugars seem to be fairly well controlled  Peripheral neuropathy On Cymbalta.  He could have diabetic neuropathy     Subjective:  Obtunded and in deep sleep.  Wife at bedside providing most of the  information  Objective Vitals:   11/18/20 0637 11/18/20 1030 11/18/20 1130 11/18/20 1200  BP: (!) 153/108 129/85 (!) 166/87 (!) 167/87  Pulse: 78 60 84 86  Resp: (!) 23 (!) 26 (!) 23 (!) 29  Temp:      TempSrc:      SpO2: 95% 94% 95% 95%  Weight:      Height:       108.9 kg  Vital signs were reviewed and unremarkable except for: Blood pressure: High    Exam Physical Exam   General appearance: alert, appears stated age, delirious, distracted, mild distress, moderately obese, and slowed mentation Head: Normocephalic, without obvious abnormality, atraumatic Eyes: conjunctivae/corneas clear. PERRL, EOM's intact. Fundi benign. Throat: lips, mucosa, and tongue normal; teeth and gums normal Neck: no adenopathy, no carotid bruit, no JVD, supple, symmetrical, trachea midline, and thyroid not enlarged, symmetric, no tenderness/mass/nodules Resp:  Positive for increased work of breathing with accessory muscle use and tachypnea. No wheezes, rales, or rhonchi were auscultated. No tactile fremitus Chest wall: no tenderness Cardio: regular rate and rhythm, S1, S2 normal, no murmur, click, rub or gallop GI: soft, non-tender; bowel sounds normal; no masses,  no organomegaly Extremities:  No cyanosis or clubbing. Positive for 2+ pitting edema bilaterally. Pulses: 2+ and symmetric Skin: Skin color, texture, turgor normal. No rashes or lesions Lymph nodes: Cervical, supraclavicular, and axillary nodes normal. Neurologic: Mental status:  Nonfocal, sleepy    Labs / Other Information My review of labs, imaging, notes and other tests is significant for Hypokalemia, elevated  BNP     Disposition Plan: Status is: Inpatient  Remains inpatient appropriate because: Respiratory failure, metabolic encephalopathy and possibly fluid overload needs further treatment.  He will need stepdown for close monitoring and [place] require BiPAP.  He is at high risk for decompensation and looks critically  sick    Wife at bedside and was updated.  We will consult palliative care for goals of care conversation    Time spent (critical care): 35 minutes [facility] 11/18/2020, 12:37 PM

## 2020-11-18 NOTE — Assessment & Plan Note (Signed)
On Cymbalta.  He could have diabetic neuropathy

## 2020-11-18 NOTE — Assessment & Plan Note (Signed)
Continue fenofibrate 

## 2020-11-18 NOTE — TOC Initial Note (Signed)
Transition of Care (TOC) - Initial/Assessment Note    Patient Details  Name: [name] Arcidiacono Essick Jr. MRN: [id number] Date of Birth: 1945-05-17  Transition of Care [facility]) CM/SW Contact:    Adelene Amas, LCSWA Phone [phone] 11/18/2020, 10:19 AM  Clinical Narrative:                  Patient presents to [facility] from home due to increasing confusion, SOB and lack of appetite for four days. Patient's spouse [name] [name],[name] [name] (Spouse) [phone] ([place]) is with the patient and has supplied collateral information.  Patient experiences intermittent confusion at baseline. Ms. [name] [name] stated the patient has refused to go to the doctor and accused her of trying to kill him when she stated she wanted to call EMS.  Patient has not used tobacco for 14 years but does consume alcohol. Ms. [name] [name] stated the patient still consumes "a few drinks a day". Patient is not physically active at baseline due to peripheral neuropathy of his feet which make ADLs and activities difficult.   Expected Discharge Plan: [place] Barriers to Discharge: Continued Medical Work up   Patient Goals and CMS Choice        Expected Discharge Plan and Services Expected Discharge Plan: [place] In-house Referral: Clinical Social Work   Post Acute Care Choice: [place] Living arrangements for the past 2 months: [place]                                      Prior Living Arrangements/Services Living arrangements for the past 2 months: Single Family Home Lives with:: Spouse ([name] [name],[name] [name] (Spouse)   [phone] ([place])) Patient language and need for interpreter reviewed:: Yes        Need for Family Participation in Patient Care: Yes (Comment) Care giver support system in place?: Yes (comment)   Criminal Activity/Legal Involvement Pertinent to Current Situation/Hospitalization: No - Comment as needed  Activities of Daily Living      Permission Sought/Granted                   Emotional Assessment   Attitude/Demeanor/Rapport: Unable to Assess Affect (typically observed): Unable to Assess Orientation: : Fluctuating Orientation (Suspected and/or reported Sundowners) Alcohol / Substance Use: Alcohol Use Psych Involvement: No (comment)  Admission diagnosis:  Toxic metabolic encephalopathy [E32.1] Patient Active Problem List   Diagnosis Date Noted   Toxic metabolic encephalopathy 22/48/2500   Coronary artery disease    Hypertension    Acute bronchitis due to infection    Community acquired pneumonia    Diabetes ([place]) 01/08/2016   Hyperlipidemia 01/08/2016   Pain in limb 01/08/2016   Insomnia 10/27/2012   Peripheral neuropathy 03/09/2012   Lumbar stenosis 12/04/2011   PCP:  [name] Billet, MD Pharmacy:   [place], [place] - [address] [place] [address] Phone: [phone] Fax: [phone]     Social Determinants of Health (SDOH) Interventions    Readmission Risk Interventions No flowsheet data found.

## 2020-11-18 NOTE — Assessment & Plan Note (Signed)
Hemoglobin A1c of 6.  Blood sugars seem to be fairly well controlled

## 2020-11-18 NOTE — Assessment & Plan Note (Signed)
He was on 2 L oxygen and had to be on 4 to maintain oxygen saturations above 85% while in the ED.  Chest x-ray shows possible pulmonary vascular congestion.  We will give Lasix 40 mg once IV and obtain pulmonary consultation.  We will admit him to stepdown and start MedSurg as he looks high risk for decompensation.  Wife was agreeable to make him DNR

## 2020-11-18 NOTE — Assessment & Plan Note (Signed)
Empiric Rocephin and Zithromax.  Chest x-ray not really very suggestive of pneumonia and seems more fluid overload

## 2020-11-18 NOTE — Assessment & Plan Note (Signed)
Likely multifactorial.  Wife reports patient has not been sleeping and or eating well for over a week.  He is barely able to walk with cane or walker and has poor baseline functional status

## 2020-11-18 NOTE — ED Notes (Signed)
Spoke with Dr. Manuella Ghazi, order given for Speech eval and chest xray. Will keep pt NPO at this time.

## 2020-11-19 DIAGNOSIS — E1169 Type 2 diabetes mellitus with other specified complication: Secondary | ICD-10-CM | POA: Diagnosis not present

## 2020-11-19 DIAGNOSIS — J209 Acute bronchitis, unspecified: Secondary | ICD-10-CM | POA: Diagnosis not present

## 2020-11-19 DIAGNOSIS — J9601 Acute respiratory failure with hypoxia: Secondary | ICD-10-CM | POA: Diagnosis not present

## 2020-11-19 DIAGNOSIS — J189 Pneumonia, unspecified organism: Secondary | ICD-10-CM | POA: Diagnosis not present

## 2020-11-19 LAB — ECHOCARDIOGRAM COMPLETE
AR max vel: 2.33 cm2
AV Area VTI: 2.26 cm2
AV Area mean vel: 2.42 cm2
AV Mean grad: 3 mmHg
AV Peak grad: 4.9 mmHg
Ao pk vel: 1.11 m/s
Area-P 1/2: 3.72 cm2
Calc EF: 78.4 %
Height: 68 in
S' Lateral: 2.35 cm
Single Plane A2C EF: 82.5 %
Single Plane A4C EF: 73.9 %
Weight: 3840 oz

## 2020-11-19 LAB — C-REACTIVE PROTEIN: CRP: 4.5 mg/dL — ABNORMAL HIGH (ref <1.0)

## 2020-11-19 LAB — CBC
HCT: 37 % — ABNORMAL LOW (ref 39.0–52.0)
Hemoglobin: 13.1 g/dL (ref 13.0–17.0)
MCH: 35.1 pg — ABNORMAL HIGH (ref 26.0–34.0)
MCHC: 35.4 g/dL (ref 30.0–36.0)
MCV: 99.2 fL (ref 80.0–100.0)
Platelets: 145 10*3/uL — ABNORMAL LOW (ref 150–400)
RBC: 3.73 MIL/uL — ABNORMAL LOW (ref 4.22–5.81)
RDW: 13.2 % (ref 11.5–15.5)
WBC: 5.1 10*3/uL (ref 4.0–10.5)
nRBC: 0 % (ref 0.0–0.2)

## 2020-11-19 LAB — BASIC METABOLIC PANEL
Anion gap: 9 (ref 5–15)
BUN: 13 mg/dL (ref 8–23)
CO2: 34 mmol/L — ABNORMAL HIGH (ref 22–32)
Calcium: 9.4 mg/dL (ref 8.9–10.3)
Chloride: 94 mmol/L — ABNORMAL LOW (ref 98–111)
Creatinine, Ser: 0.72 mg/dL (ref 0.61–1.24)
GFR, Estimated: >60 mL/min (ref >60)
Glucose, Bld: 140 mg/dL — ABNORMAL HIGH (ref 70–99)
Potassium: 2.9 mmol/L — ABNORMAL LOW (ref 3.5–5.1)
Sodium: 137 mmol/L (ref 135–145)

## 2020-11-19 LAB — PROCALCITONIN: Procalcitonin: 0.16 ng/mL

## 2020-11-19 LAB — MAGNESIUM: Magnesium: 1.2 mg/dL — ABNORMAL LOW (ref 1.7–2.4)

## 2020-11-19 MED ORDER — SENNOSIDES-DOCUSATE SODIUM 8.6-50 MG PO TABS
2.0000 | ORAL_TABLET | Freq: Two times a day (BID) | ORAL | Status: DC
  Administered 2020-11-19 – 2020-11-29 (×19): 2 via ORAL
  Filled 2020-11-19 (×19): qty 2

## 2020-11-19 MED ORDER — POTASSIUM CHLORIDE CRYS ER 20 MEQ PO TBCR
40.0000 meq | EXTENDED_RELEASE_TABLET | Freq: Once | ORAL | Status: AC
  Administered 2020-11-19: 40 meq via ORAL
  Filled 2020-11-19: qty 4

## 2020-11-19 MED ORDER — BISACODYL 5 MG PO TBEC
5.0000 mg | DELAYED_RELEASE_TABLET | Freq: Every day | ORAL | Status: DC
  Administered 2020-11-19 – 2020-11-29 (×11): 5 mg via ORAL
  Filled 2020-11-19 (×11): qty 1

## 2020-11-19 MED ORDER — MAGNESIUM SULFATE 4 GM/100ML IV SOLN
4.0000 g | Freq: Once | INTRAVENOUS | Status: AC
  Administered 2020-11-19: 4 g via INTRAVENOUS
  Filled 2020-11-19: qty 100

## 2020-11-19 MED ORDER — FUROSEMIDE 10 MG/ML IJ SOLN
40.0000 mg | INTRAMUSCULAR | Status: AC
  Administered 2020-11-19: 40 mg via INTRAVENOUS
  Filled 2020-11-19: qty 4

## 2020-11-19 NOTE — Evaluation (Signed)
Occupational Therapy Evaluation Patient Details Name: [name] Damiani Morency Jr. MRN: [id number] DOB: [birth year] Today's Date: 11/19/2020   History of Present Illness Pt is 75 yr old man with a known history of CAD, PAF, DM II, Peripheral neuropathy, hypertension, and history of stroke who according to his wife has intermittent episodes of confusion and disorientation at baseline. Pt presented to [facility] with increased short of breath with a progressively worsening cough for 4 days. He was admitted for acute hypoxic respiratory failure and acute metabolic encephalopathy   Clinical Impression   Pt seen for OT evaluation this date. Upon arrival to room, pt sitting upright in bed (resting HR 100s-110s). No family present during time of evaluation. Pt alert and oriented to self and reports he is "at cone". Pt reports that he was receiving assist from wife for ADLs and that he was not walking prior to hospital admission, however was unable to state level of assist required for ADLs or last time he participated in OOB mobility. Plan to confirm PLOF with family when able. Home set-up (see below) obtained via chart review. Pt currently presents with decreased strength, decreased activity tolerance, and decreased awareness of deficits. Due to these functional impairments, pt requires MAX A for bed-level LB ADLs, MIN A for supine>sit transfers with HOB elevated, and MIN GUARD for seated grooming tasks at EOB. Following bed mobility, HR briefly increasing to 140s. After seated rest break, HR decreasing to 120s-130s. Pt was able to tolerate sitting EOB for 5 mins before becoming fatigued and requiring MAX A for sit>supine transfer (+2 to scoot toward [facility]). Pt left in bed with all needs within reach and in no acute distress, with HR 106 (RN & MD informed). Pt would benefit from additional skilled OT services to maximize return to PLOF and minimize risk of future falls, injury, caregiver burden, and readmission. Upon discharge,  recommend SNF.         Recommendations for follow up therapy are one component of a multi-disciplinary discharge planning process, led by the attending physician.  Recommendations Chiara be updated based on patient status, additional functional criteria and insurance authorization.   Follow Up Recommendations  Skilled nursing-short term rehab (<3 hours/day)    Assistance Recommended at Discharge Frequent or constant Supervision/Assistance  Functional Status Assessment  Patient has had a recent decline in their functional status and/or demonstrates limited ability to make significant improvements in function in a reasonable and predictable amount of time  Equipment Recommendations  Other (comment) (defer to next venue of care)       Precautions / Restrictions Precautions Precautions: Fall Restrictions Weight Bearing Restrictions: No      Mobility Bed Mobility Overal bed mobility: Needs Assistance Bed Mobility: Supine to Sit;Sit to Supine     Supine to sit: Min assist;HOB elevated Sit to supine: Max assist   General bed mobility comments: MIN A for trunk support. MAX A for sit>supine in setting of fatigue    Transfers                   General transfer comment: unsafe to attempt this date d/t elevated HR      Balance Overall balance assessment: Needs assistance Sitting-balance support: No upper extremity supported;Feet supported Sitting balance-Leahy Scale: Poor Sitting balance - Comments: pt able to engage in seated grooming tasks at EOB MIN GUARD for ~5 mins before becoming fatigued. Posterior lean observed when fatigued, requiring CGA  ADL either performed or assessed with clinical judgement   ADL Overall ADL's : Needs assistance/impaired     Grooming: Applying deodorant;Brushing hair;Min guard;Sitting               Lower Body Dressing: Maximal assistance;Bed level Lower Body Dressing Details (indicate  cue type and reason): to don/doff socks                      Pertinent Vitals/Pain Pain Assessment: No/denies pain Faces Pain Scale: Hurts even more Pain Location: B feet when pressure applied. Pain Descriptors / Indicators: Aching Pain Intervention(s): Monitored during session     Hand Dominance Right   Extremity/Trunk Assessment Upper Extremity Assessment Upper Extremity Assessment: Generalized weakness   Lower Extremity Assessment Lower Extremity Assessment: Generalized weakness       Communication Communication Communication: No difficulties   Cognition Arousal/Alertness: Awake/alert Behavior During Therapy: WFL for tasks assessed/performed Overall Cognitive Status: No family/caregiver present to determine baseline cognitive functioning                                 General Comments: Pt alert and oriented to self and reports he is "at cone". Able to recall some aspects of situation. Pt providing inconsistent reports regarding PLOF; plan to confirm with family when able. Pt also reporting that he was experiencing hallucinations prior to this date.     General Comments  Pt with resting HR 100s-110s. Following bed mobility, HR briefly increasing to 140s. While seated EOB for 5 mins, HR 120s-130s. At end of session, pt returned to semi-folwer's position in bed with HR 106 and in pt no acute distress. Pt denying any lightheadedness while sitting EOB. RN & MD informed of elevated BP.            Home Living Family/patient expects to be discharged to:: Private residence Living Arrangements: Spouse/significant other Available Help at Discharge: Family;Available 24 hours/day Type of Home: House Home Access: Stairs to enter [employer] of Steps: 3 Entrance Stairs-Rails: None Home Layout: One level     Bathroom Shower/Tub: Teacher, early years/pre: Standard Bathroom Accessibility: Yes   Home Equipment: Conservation officer, nature (2  wheels);Cane - single point;BSC;Shower seat   Additional Comments: Pt uses furniture/walls in the home to ambulate within the house, but has not been getting up and moving around much due to weakness.      Prior Functioning/Environment Prior Level of Function : Needs assist       Physical Assist : ADLs (physical);Mobility (physical) Mobility (physical): Bed mobility;Gait ADLs (physical): Grooming;Feeding;Bathing;Dressing;Toileting Mobility Comments: Pt's wife reports that she typically helps out around the house with tasks, but he also attempts to do things on his own. ADLs Comments: Pt reports that wife assists with ADLs, but that he is typically able to seat EOB to do so        OT Problem List: Decreased strength;Decreased activity tolerance;Impaired balance (sitting and/or standing);Cardiopulmonary status limiting activity;Decreased cognition      OT Treatment/Interventions: Self-care/ADL training;Therapeutic exercise;Energy conservation;DME and/or AE instruction;Therapeutic activities;Patient/family education;Balance training    OT Goals(Current goals can be found in the care plan section) Acute Rehab OT Goals Patient Stated Goal: to return home with wife OT Goal Formulation: With patient Time For Goal Achievement: 12/03/20 Potential to Achieve Goals: Fair ADL Goals Pt Will Perform Upper Body Dressing: with min guard assist;sitting Pt Will Perform Lower Body Dressing: with min  assist;with adaptive equipment;sitting/lateral leans Pt Will Transfer to Toilet: with max assist;stand pivot transfer;bedside commode  OT Frequency: Min 1X/week    AM-PAC OT "6 Clicks" Daily Activity     Outcome Measure Help from another person eating meals?: None Help from another person taking care of personal grooming?: A Little Help from another person toileting, which includes using toliet, bedpan, or urinal?: A Lot Help from another person bathing (including washing, rinsing, drying)?: A  Lot Help from another person to put on and taking off regular upper body clothing?: A Little Help from another person to put on and taking off regular lower body clothing?: A Lot 6 Click Score: 16   End of Session Nurse Communication: Mobility status;Other (comment) (HR during functional mobility)  Activity Tolerance: Patient tolerated treatment well Patient left: in bed;with call bell/phone within reach;with bed alarm set  OT Visit Diagnosis: Unsteadiness on feet (R26.81);Muscle weakness (generalized) (M62.81)                Time: 2194-7125 OT Time Calculation (min): 22 min Charges:  OT General Charges $OT Visit: 1 Visit OT Evaluation $OT Eval Moderate Complexity: 1 Mod OT Treatments $Self Care/Home Management : 8-22 mins  Fredirick Maudlin, OTR/L [place]

## 2020-11-19 NOTE — Assessment & Plan Note (Signed)
Continue Rocephin and Zithromax for now.  Get influenza vaccination before discharge
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
## 2020-11-19 NOTE — Progress Notes (Signed)
Progress Note    [name] Lundahl Westling Jr.   ZOX:[id number]  DOB: Nov 04, 1945  DOA: 11/17/2020     2 Date of Service: 11/19/2020   Clinical Course  75 yr old man with a known history of CAD, PAF, DM II, Peripheral neuropathy, Hypertension, and history of stroke who according to his wife has intermittent episodes of confusion and disorientation at baseline. He has been increasingly short of breath with a progressively worsening cough for 4 days.  He is admitted for acute hypoxic respiratory failure and acute metabolic encephalopathy  81/19 -possible aspiration event.  Oxygen requirement went up to 4 L.  Giving 40 of Lasix based on the chest x-ray showing pulmonary vascular congestion. 10/31: Weaned off to 2.5 L oxygen.  Pulmonary seen transferred to any MedSurg giving additional 40 mg of Lasix today PT, OT consult   Assessment and Plan * Acute respiratory failure with hypoxia and hypercapnia (HCC) He was on 2 L oxygen and had to be on 4 to maintain oxygen saturations above 85% while in the ED.  Chest x-ray shows possible pulmonary vascular congestion.  We will give another 40 mg of Lasix today.  He was given 40 mg yesterday as well.  He has been weaned to 2.5 L oxygen today Net IO Since Admission: -150 mL [11/19/20 1419]  Community acquired pneumonia Empiric Rocephin and Zithromax.  Appreciate pulmonary input.  Checking Legionella, strep pneumo, histoplasma and serum Fungitell.  COVID-negative and respiratory viral panel negative so far  Acute bronchitis due to infection Continue Rocephin and Zithromax for now.  Get influenza vaccination before discharge  Toxic metabolic encephalopathy Likely multifactorial.  Wife reports patient has not been sleeping and or eating well for over a week.  He is barely able to walk with cane or walker and has poor baseline functional status.  He is somewhat more awake today.  PT, OT eval  Hyperlipidemia Continue fenofibrate  Diabetes (HCC) Hemoglobin A1c of 6.   Blood sugars seem to be fairly well controlled  Peripheral neuropathy On Cymbalta     Subjective:  Slowly improving.  Feeling little better than yesterday although very weak.  Wife at bedside  Objective Vitals:   11/19/20 0748 11/19/20 0807 11/19/20 1144 11/19/20 1414  BP:   118/77   Pulse:   98   Resp:   18   Temp:   97.9 F (36.6 C)   TempSrc:      SpO2: 95% 93% 93% 93%  Weight:      Height:       108.9 kg  Vital signs were reviewed and unremarkable except for: SpO2: Saturating 93% on 2.5 L   Exam Physical Exam   General appearance: alert, appears stated age, delirious, distracted, mild distress, moderately obese, and slowed mentation Head: Normocephalic, without obvious abnormality, atraumatic Eyes: conjunctivae/corneas clear. PERRL, EOM's intact. Fundi benign. Throat: lips, mucosa, and tongue normal; teeth and gums normal Neck: no adenopathy, no carotid bruit, no JVD, supple, symmetrical, trachea midline, and thyroid not enlarged, symmetric, no tenderness/mass/nodules Resp: Not using accessory muscle use . No wheezes, rales, or rhonchi were auscultated. No tactile fremitus Chest wall: no tenderness Cardio: regular rate and rhythm, S1, S2 normal, no murmur, click, rub or gallop GI: soft, non-tender; bowel sounds normal; no masses,  no organomegaly Extremities:  No cyanosis or clubbing. Positive for 2+ pitting edema bilaterally. Pulses: 2+ and symmetric Skin: Skin color, texture, turgor normal. No rashes or lesions Lymph nodes: Cervical, supraclavicular, and axillary nodes normal. Neurologic:  Mental status:  Nonfocal, sleepy   Labs / Other Information My review of labs, imaging, notes and other tests shows no new significant findings.    Disposition Plan: Status is: Inpatient  Remains inpatient appropriate because: Still requiring 2 and half liters oxygen.  Await PT, OT eval  Transfer to any MedSurg  Possible discharge in next 1 to 2 days depending on  clinical condition   Time spent: 35 minutes [facility] 11/19/2020, 2:20 PM

## 2020-11-19 NOTE — Assessment & Plan Note (Signed)
On Cymbalta 

## 2020-11-19 NOTE — Assessment & Plan Note (Signed)
Empiric Rocephin and Zithromax.  Appreciate pulmonary input.  Checking Legionella, strep pneumo, histoplasma and serum Fungitell.  COVID-negative and respiratory viral panel negative so far

## 2020-11-19 NOTE — Assessment & Plan Note (Signed)
Continue fenofibrate 

## 2020-11-19 NOTE — Assessment & Plan Note (Addendum)
Likely multifactorial.  Wife reports patient has not been sleeping and or eating well for over a week.  He is barely able to walk with cane or walker and has poor baseline functional status.  Patient has not walked much over the last several week according to wife.  PT and OT evaluation recommends SNF he also has baseline essential tremor which has worsened over time according to wife.  TOC working on SNF.  Palliative care consult

## 2020-11-19 NOTE — Assessment & Plan Note (Signed)
Hemoglobin A1c of 6.  Blood sugars seem to be fairly well controlled

## 2020-11-19 NOTE — Evaluation (Addendum)
Physical Therapy Evaluation Patient Details Name: Man Effertz Fargnoli Jr. MRN: [id number] DOB: 1945/10/12 Today's Date: 11/19/2020  History of Present Illness  Pt is 75 yr old man with a known history of CAD, PAF, DM II, Peripheral neuropathy, hypertension, and history of stroke who according to his wife has intermittent episodes of confusion and disorientation at baseline. Pt presented to [facility] with increased short of breath with a progressively worsening cough for 4 days. He was admitted for acute hypoxic respiratory failure and acute metabolic encephalopathy    Clinical Impression  Pt received in Semi-Fowler's position and agreeable to therapy.  Pt's wife in room to assist with PLOF and home set-up.  Pt agreeable to performing bed-level exercises, but refuses to perform bed mobility at this time.  Pt performed well with exercises, however notable decreased strength found in hip flexors, only able to perform 5 sets instead of 10.  Pt notes neuropathy pain in B feet preventing him from ambulating at this time and notes that he has not walked in "several weeks...many many weeks" although wife reports he typically ambulates around the house.  Wife does assist with most ADL's at this time as well.  Due to decreased support and inability to ambulate within the home, recommendation is for STR at SNF at this time.       Recommendations for follow up therapy are one component of a multi-disciplinary discharge planning process, led by the attending physician.  Recommendations Tyree be updated based on patient status, additional functional criteria and insurance authorization.  Follow Up Recommendations Skilled nursing-short term rehab (<3 hours/day)    Assistance Recommended at Discharge Frequent or constant Supervision/Assistance  Functional Status Assessment Patient has had a recent decline in their functional status and demonstrates the ability to make significant improvements in function in a reasonable and  predictable amount of time.  Equipment Recommendations  None recommended by PT    Recommendations for Other Services       Precautions / Restrictions Precautions Precautions: Fall Restrictions Weight Bearing Restrictions: No      Mobility  Bed Mobility               General bed mobility comments: pt deferred due to pain in bilateral feet.    Transfers                        Ambulation/Gait                Stairs            Wheelchair Mobility    Modified Rankin (Stroke Patients Only)       Balance                                             Pertinent Vitals/Pain Pain Assessment: Faces Faces Pain Scale: Hurts even more Pain Location: B feet when pressure applied. Pain Descriptors / Indicators: Aching Pain Intervention(s): Monitored during session    Home Living                          Prior Function                       Hand Dominance        Extremity/Trunk Assessment   Upper Extremity Assessment Upper Extremity  Assessment: Generalized weakness    Lower Extremity Assessment Lower Extremity Assessment: Generalized weakness       Communication      Cognition Arousal/Alertness: Awake/alert Behavior During Therapy: WFL for tasks assessed/performed Overall Cognitive Status: History of cognitive impairments - at baseline                                          General Comments      Exercises Total Joint Exercises Ankle Circles/Pumps: AROM;Strengthening;Both;10 reps;Supine Quad Sets: AROM;Strengthening;Both;10 reps;Supine Gluteal Sets: AROM;Strengthening;Both;10 reps;Supine Heel Slides: AROM;Strengthening;Both;10 reps;Supine Hip ABduction/ADduction: AROM;Strengthening;Both;10 reps;Supine Straight Leg Raises: AROM;Strengthening;Both;5 reps;Supine (Pt states he is only able to do 5.) Other Exercises Other Exercises: Pt educated on role of PT and services  provided during hospital stay.  Pt also encouraged to continue with therapeutic exercises in order to prevent deconditioning during hospital stay.   Assessment/Plan    PT Assessment Patient needs continued PT services  PT Problem List Decreased strength;Decreased activity tolerance;Decreased mobility       PT Treatment Interventions DME instruction;Gait training;Functional mobility training;Therapeutic activities;Therapeutic exercise;Balance training;Neuromuscular re-education    PT Goals (Current goals can be found in the Care Plan section)  Acute Rehab PT Goals Patient Stated Goal: to go home PT Goal Formulation: With patient Time For Goal Achievement: 12/03/20 Potential to Achieve Goals: Good    Frequency Min 2X/week   Barriers to discharge Inaccessible home environment      Co-evaluation               AM-PAC PT "6 Clicks" Mobility  Outcome Measure Help needed turning from your back to your side while in a flat bed without using bedrails?: A Lot Help needed moving from lying on your back to sitting on the side of a flat bed without using bedrails?: A Lot Help needed moving to and from a bed to a chair (including a wheelchair)?: A Lot Help needed standing up from a chair using your arms (e.g., wheelchair or bedside chair)?: A Lot Help needed to walk in hospital room?: Total Help needed climbing 3-5 steps with a railing? : Total 6 Click Score: 10    End of Session   Activity Tolerance: Patient limited by fatigue;Patient limited by pain Patient left: in bed;with call bell/phone within reach;with bed alarm set Nurse Communication: Mobility status PT Visit Diagnosis: Unsteadiness on feet (R26.81);Muscle weakness (generalized) (M62.81);Difficulty in walking, not elsewhere classified (R26.2)    Time: 1352-1410 PT Time Calculation (min) (ACUTE ONLY): 18 min   Charges:   PT Evaluation $PT Eval Low Complexity: 1 Low PT Treatments $Therapeutic Exercise: 8-22 mins         Gwenlyn Saran, PT, DPT 11/19/20, 4:07 PM   Christie Nottingham 11/19/2020, 4:07 PM

## 2020-11-19 NOTE — Assessment & Plan Note (Addendum)
He was on 2 L oxygen and had to be on 4 to maintain oxygen saturations above 85% while in the ED.  Chest x-ray shows possible pulmonary vascular congestion.  Received total 80 mg of Lasix over last 2 days. (40 mg each day).  He is weaned off to 2 L oxygen Net IO Since Admission: -150 mL [11/19/20 1419]

## 2020-11-20 DIAGNOSIS — J189 Pneumonia, unspecified organism: Secondary | ICD-10-CM | POA: Diagnosis not present

## 2020-11-20 DIAGNOSIS — I2510 Atherosclerotic heart disease of native coronary artery without angina pectoris: Secondary | ICD-10-CM | POA: Diagnosis not present

## 2020-11-20 DIAGNOSIS — J9601 Acute respiratory failure with hypoxia: Secondary | ICD-10-CM | POA: Diagnosis not present

## 2020-11-20 DIAGNOSIS — J209 Acute bronchitis, unspecified: Secondary | ICD-10-CM | POA: Diagnosis not present

## 2020-11-20 DIAGNOSIS — Z7189 Other specified counseling: Secondary | ICD-10-CM

## 2020-11-20 LAB — BASIC METABOLIC PANEL
Anion gap: 7 (ref 5–15)
BUN: 13 mg/dL (ref 8–23)
CO2: 35 mmol/L — ABNORMAL HIGH (ref 22–32)
Calcium: 10 mg/dL (ref 8.9–10.3)
Chloride: 95 mmol/L — ABNORMAL LOW (ref 98–111)
Creatinine, Ser: 0.86 mg/dL (ref 0.61–1.24)
GFR, Estimated: >60 mL/min (ref >60)
Glucose, Bld: 148 mg/dL — ABNORMAL HIGH (ref 70–99)
Potassium: 3.2 mmol/L — ABNORMAL LOW (ref 3.5–5.1)
Sodium: 137 mmol/L (ref 135–145)

## 2020-11-20 LAB — CBC
HCT: 36.9 % — ABNORMAL LOW (ref 39.0–52.0)
Hemoglobin: 12.9 g/dL — ABNORMAL LOW (ref 13.0–17.0)
MCH: 34 pg (ref 26.0–34.0)
MCHC: 35 g/dL (ref 30.0–36.0)
MCV: 97.4 fL (ref 80.0–100.0)
Platelets: 142 10*3/uL — ABNORMAL LOW (ref 150–400)
RBC: 3.79 MIL/uL — ABNORMAL LOW (ref 4.22–5.81)
RDW: 13.2 % (ref 11.5–15.5)
WBC: 4.7 10*3/uL (ref 4.0–10.5)
nRBC: 0 % (ref 0.0–0.2)

## 2020-11-20 MED ORDER — POTASSIUM CHLORIDE CRYS ER 20 MEQ PO TBCR
40.0000 meq | EXTENDED_RELEASE_TABLET | Freq: Once | ORAL | Status: AC
  Administered 2020-11-20: 40 meq via ORAL
  Filled 2020-11-20: qty 2

## 2020-11-20 MED ORDER — GUAIFENESIN 100 MG/5ML PO LIQD
5.0000 mL | ORAL | Status: DC | PRN
  Administered 2020-11-20 – 2020-11-21 (×4): 5 mL via ORAL
  Filled 2020-11-20: qty 5
  Filled 2020-11-20: qty 10
  Filled 2020-11-20 (×2): qty 5
  Filled 2020-11-20: qty 10
  Filled 2020-11-20: qty 5

## 2020-11-20 MED ORDER — METOPROLOL TARTRATE 25 MG PO TABS
25.0000 mg | ORAL_TABLET | Freq: Two times a day (BID) | ORAL | Status: DC
  Administered 2020-11-20 – 2020-11-23 (×6): 25 mg via ORAL
  Filled 2020-11-20 (×7): qty 1

## 2020-11-20 NOTE — Progress Notes (Signed)
Progress Note    [name] Lunn Colledge Jr.   BEM:[id number]  DOB: [birth year]  DOA: 11/17/2020     3 Date of Service: 11/20/2020   Clinical Course  75 yr old man with a known history of CAD, PAF, DM II, Peripheral neuropathy, Hypertension, and history of stroke who according to his wife has intermittent episodes of confusion and disorientation at baseline. He has been increasingly short of breath with a progressively worsening cough for 4 days.  He is admitted for acute hypoxic respiratory failure and acute metabolic encephalopathy  07/12 -possible aspiration event.  Oxygen requirement went up to 4 L.  Giving 40 of Lasix based on the chest x-ray showing pulmonary vascular congestion. 10/31: Weaned off to 2.5 L oxygen.  Pulmonary seen transferred to any MedSurg giving additional 40 mg of Lasix today PT, OT consult 11/1: PT,OT recommends SNF, palliative care consult   Assessment and Plan * Acute respiratory failure with hypoxia and hypercapnia (HCC) He was on 2 L oxygen and had to be on 4 to maintain oxygen saturations above 85% while in the ED.  Chest x-ray shows possible pulmonary vascular congestion.  Received total 80 mg of Lasix over last 2 days. (40 mg each day).  He is weaned off to 2 L oxygen Net IO Since Admission: -150 mL [11/19/20 1419]  Goals of care, counseling/discussion Palliative care consult.  Patient is DNR.  He [place] be appropriate for transitioning to hospice in near future if no improvement  Community acquired pneumonia Empiric Rocephin and Zithromax.  Appreciate pulmonary input.  Checking Legionella, strep pneumo, histoplasma and serum Fungitell.  COVID-negative and respiratory viral panel negative so far  Acute bronchitis due to infection Continue Rocephin and Zithromax for now.  Get influenza vaccination before discharge  Hypertension Start metoprolol  Coronary artery disease Continue statin and Eliquis.  As blood pressure is improved we can restart metoprolol at a lower  dose  Toxic metabolic encephalopathy Likely multifactorial.  Wife reports patient has not been sleeping and or eating well for over a week.  He is barely able to walk with cane or walker and has poor baseline functional status.  Patient has not walked much over the last several week according to wife.  PT and OT evaluation recommends SNF he also has baseline essential tremor which has worsened over time according to wife.  TOC working on SNF.  Palliative care consult  Hyperlipidemia Continue fenofibrate  Diabetes (HCC) Hemoglobin A1c of 6.  Blood sugars seem to be fairly well controlled  Peripheral neuropathy On Cymbalta     Subjective:  He is more awake.  Feeling very weak and agreeable for SNF placement.  Wife at bedside and is very supportive  Objective Vitals:   11/20/20 0755 11/20/20 1128 11/20/20 1355 11/20/20 1439  BP:  (!) 149/81  135/68  Pulse:  94  (!) 110  Resp:  18  18  Temp:  97.8 F (36.6 C)  97.7 F (36.5 C)  TempSrc:      SpO2: 95% 98% 92% 94%  Weight:      Height:       108.9 kg  Vital signs were reviewed and unremarkable except for: Pulse: Tachycardia    Exam Physical Exam   General appearance:alert, appears stated age and slowed mentation Head:Normocephalic, without obvious abnormality, atraumatic Eyes:conjunctivae/corneas clear. PERRL, EOM's intact.  Throat:lips, mucosa, and tongue normal; teeth and gums normal Neck:no adenopathy, no carotid bruit, no JVD, supple, symmetrical, trachea midline, and thyroid not enlarged,  symmetric, no tenderness/mass/nodules Resp:Not using accessory muscle use . No wheezes, rales, or rhonchi were auscultated. Chest wall:no tenderness Cardio:regular rate and rhythm, S1, S2 normal, no murmur, click, rub or gallop HT:MBPJ, non-tender; bowel sounds normal; no masses, no organomegaly Extremities:No cyanosis or clubbing. Positive for 1+ pitting edema bilaterally. Skin:Skin color, texture, turgor normal. No  rashes or lesions Neurologic:Nonfocal, awake and alert Mental status:Normal mood and affect   Labs / Other Information My review of labs, imaging, notes and other tests is significant for Hypokalemia     Disposition Plan: Status is: Inpatient  Remains inpatient appropriate because: Needs SNF, await palliative care consult.  Can be transferred to any MedSurg with off unit telemetry  Possible discharge in next 1 to 2 days if SNF bed available  Updated daughter at bedside    Time spent: 35 minutes [facility] 11/20/2020, 4:44 PM

## 2020-11-20 NOTE — Assessment & Plan Note (Signed)
Start metoprolol.

## 2020-11-20 NOTE — Progress Notes (Signed)
                                                     Palliative Care Progress Note, Assessment & Plan   Patient Name: [name] Gadd Goodwill Jr.       Date: 11/20/2020 DOB: 11-19-1945  Age: 75 y.o. MRN#: [id number] Attending Physician: Max Sane, MD Primary Care Physician: Albina Billet, MD Admit Date: 11/17/2020  Reason for Consultation/Follow-up: Establishing goals of care  Subjective: Patient is asleep and lying in bed in no apparent distress.  His wife [name] [name] is at bedside.  Summary: After reviewing the patient's chart in epic, labs, and assessing the patient at bedside, the wife shared that she would not like to wake the patient up at this time.  I introduced palliative medicine as a subspecialty that focuses on the whole person, including symptom management and goals of care planning.  The patient's wife shared that she would like to speak with the palliative medicine team but did not want to wake her husband up at this time.  I gave her my card and contact information and advised her to call when ready.  She shared she would be with him all day today and would likely give me a call this afternoon.  I will await a call from patient/patient's wife and set up a time to discuss [place].    Palliative medicine team will continue to monitor the patient throughout this hospitalization.  Lavaca Ilsa Iha, FNP-BC Palliative Medicine Team Team Phone # [phone]   NO CHARGE

## 2020-11-20 NOTE — Consult Note (Signed)
Pulmonary Medicine          HISTORY OF PRESENT ILLNESS   He is up wife present. More alert from yesterday. Breathing better, but short with activity. Working with walker with help to the bathroom. Chart reviewed. Appreciate Dr. Lanney Gins input  Results for [name] [name], [name] [name] (MRN [id number]) as of 11/20/2020 16:39  Ref. Range 11/17/2020 15:56  RESP PANEL BY RT-PCR (FLU A&B, COVID) ARPGX2 Unknown Rpt  Influenza A By PCR Latest Ref Range: NEGATIVE  NEGATIVE  Influenza B By PCR Latest Ref Range: NEGATIVE  NEGATIVE  SARS Coronavirus 2 by RT PCR Latest Ref Range: NEGATIVE  NEGATIVE    BLOOD RIGHT HAND    Special Requests BOTTLES DRAWN AEROBIC AND ANAEROBIC Blood Culture results [place] not be optimal due to an inadequate volume of blood received in culture bottles  Culture NO GROWTH 3 DAYS  Performed at [facility], [address], [place], [place] [address]   Report Status PENDING     Results for [name] [name], [name] [name] (MRN [id number]) as of 11/20/2020 16:39  Ref. Range 11/21/2011 14:19  Staphylococcus aureus Latest Ref Range: NEGATIVE  NEGATIVE   Results for [name] [name], [name] [name] (MRN [id number]) as of 11/20/2020 16:39  Ref. Range 11/21/2011 14:19  MRSA, PCR Latest Ref Range: NEGATIVE  NEGATIVE    PAST MEDICAL HISTORY   Past Medical History:  Diagnosis Date   Arthritis    Coronary artery disease    Depression    STARTED AFTER  OPEN HEART SURG.   Diabetes mellitus without complication ([place])    Borderline   Foot pain, bilateral    Gout    Hypertension    Insomnia 10/27/2012   Lumbosacral spinal stenosis    Myocardial infarction ([place])    Neuropathy    FIBRO        SEES  DR. Raliegh Ip  WILLIS   Restless leg syndrome    Stroke [facility])    2008  RIGHT SIDE...HAD FOR A COUPLE OF DAYS AND THEN WENT AWAY   Stroke ([place])    WENT TO [place]  2008     SURGICAL HISTORY   Past Surgical History:  Procedure Laterality Date   BACK SURGERY     CARDIAC CATHETERIZATION     COLONOSCOPY  WITH PROPOFOL N/A 11/10/2018   Procedure: COLONOSCOPY WITH PROPOFOL;  Surgeon: Robert Bellow, MD;  Location: [facility] ENDOSCOPY;  Service: Endoscopy;  Laterality: N/A;   CORONARY ARTERY BYPASS GRAFT     (2010 @ [place]     ?? BLEPHOPLASTY   HAND SURGERY     CRUSHING INJURY---1966  HAS ABOUT 6 SURGERIES THEN   HAND SURGERY Right    HERNIA REPAIR     BILATERAL   LUMBAR FUSION  11/25/2011   POSTERIOR     FAMILY HISTORY   Family History  Problem Relation Age of Onset   Diabetes Mother      SOCIAL HISTORY   Social History   Tobacco Use   Smoking status: Former    Packs/day: 1.00    Years: 30.00    Pack years: 30.00    Types: Cigarettes    Start date: 01/21/1968    Quit date: 01/20/2006    Years since quitting: 14.8   Smokeless tobacco: Former    Quit date: 01/25/2006  Vaping Use   Vaping Use: Never used  Substance Use Topics   Alcohol use: Yes    Alcohol/week: 12.0 standard  drinks    Types: 4 Glasses of wine, 8 Shots of liquor per week    Comment: occasional   Drug use: No     MEDICATIONS    Home Medication:    Current Medication:  Current Facility-Administered Medications:    0.9 %  sodium chloride infusion, , Intravenous, PRN, Swayze, Ava, DO, Last Rate: 10 mL/hr at 11/18/20 1818, 10 mL/hr at 11/18/20 1818   acetaminophen (TYLENOL) tablet 650 mg, 650 mg, Oral, Q6H PRN **OR** acetaminophen (TYLENOL) suppository 650 mg, 650 mg, Rectal, Q6H PRN, Swayze, Ava, DO   albuterol (PROVENTIL) (2.5 MG/3ML) 0.083% nebulizer solution 2.5 mg, 2.5 mg, Nebulization, Q6H, Swayze, Ava, DO, 2.5 mg at 11/20/20 1354   apixaban (ELIQUIS) tablet 5 mg, 5 mg, Oral, BID, Swayze, Ava, DO, 5 mg at 11/20/20 0853   azithromycin (ZITHROMAX) 500 mg in sodium chloride 0.9 % 250 mL IVPB, 500 mg, Intravenous, Q24H, Vladimir Crofts, MD, Last Rate: 250 mL/hr at 11/19/20 1720, 500 mg at 11/19/20 1720   bisacodyl (DULCOLAX) EC tablet 5 mg, 5 mg, Oral, Daily, Max Sane, MD, 5 mg at 11/20/20  0853   cefTRIAXone (ROCEPHIN) 2 g in sodium chloride 0.9 % 100 mL IVPB, 2 g, Intravenous, Q24H, Vladimir Crofts, MD, Last Rate: 200 mL/hr at 11/19/20 1640, 2 g at 11/19/20 1640   cholecalciferol (VITAMIN D3) tablet 1,000 Units, 1,000 Units, Oral, Daily, Swayze, Ava, DO, 1,000 Units at 11/20/20 0853   DULoxetine (CYMBALTA) DR capsule 30 mg, 30 mg, Oral, BID, Swayze, Ava, DO, 30 mg at 11/20/20 0853   fenofibrate tablet 160 mg, 160 mg, Oral, Daily, Swayze, Ava, DO, 160 mg at 76/73/41 9379   folic acid (FOLVITE) tablet 1 mg, 1 mg, Oral, Daily, Swayze, Ava, DO, 1 mg at 11/20/20 0853   guaiFENesin (MUCINEX) 12 hr tablet 1,200 mg, 1,200 mg, Oral, BID, Swayze, Ava, DO, 1,200 mg at 11/20/20 0854   haloperidol lactate (HALDOL) injection 5 mg, 5 mg, Intravenous, Q6H PRN, Swayze, Ava, DO   influenza vaccine adjuvanted (FLUAD) injection 0.5 mL, 0.5 mL, Intramuscular, Once, Swayze, Ava, DO   LORazepam (ATIVAN) tablet 1-4 mg, 1-4 mg, Oral, Q1H PRN **OR** LORazepam (ATIVAN) injection 1-4 mg, 1-4 mg, Intravenous, Q1H PRN, Swayze, Ava, DO   multivitamin with minerals tablet 1 tablet, 1 tablet, Oral, Daily, Swayze, Ava, DO, 1 tablet at 11/20/20 0853   ondansetron (ZOFRAN) tablet 4 mg, 4 mg, Oral, Q6H PRN **OR** ondansetron (ZOFRAN) injection 4 mg, 4 mg, Intravenous, Q6H PRN, Swayze, Ava, DO   senna-docusate (Senokot-S) tablet 2 tablet, 2 tablet, Oral, BID, Manuella Ghazi, Vipul, MD, 2 tablet at 11/20/20 0853   simvastatin (ZOCOR) tablet 40 mg, 40 mg, Oral, QHS, Swayze, Ava, DO, 40 mg at 11/19/20 2058    ALLERGIES   Patient has no known allergies.     REVIEW OF SYSTEMS    Review of Systems:  Gen:  Denies  fever, sweats, chills weigh loss  HEENT: Denies blurred vision, double vision, ear pain, eye pain, hearing loss, nose bleeds, sore throat Cardiac:  No dizziness, chest pain or heaviness, chest tightness,edema Resp:   Denies cough or sputum porduction, shortness of breath,wheezing, hemoptysis,  Gi: Denies swallowing  difficulty, stomach pain, nausea or vomiting, diarrhea, constipation, bowel incontinence Gu:  Denies bladder incontinence, burning urine Ext:   Denies Joint pain, stiffness or swelling Skin: Denies  skin rash, easy bruising or bleeding or hives Endoc:  Denies polyuria, polydipsia , polyphagia or weight change Psych:   Denies depression, insomnia or hallucinations  Other:  All other systems negative   VS: BP 135/68 (BP Location: Right Arm)   Pulse (!) 110   Temp 97.7 F (36.5 C)   Resp 18   Ht 5\' 8"  (1.727 m)   Wt 108.9 kg   SpO2 94%   BMI 36.49 kg/m      PHYSICAL EXAM    GENERAL:NAD, no fevers, chills, no weakness no fatigue HEAD: Normocephalic, atraumatic.  EYES: Pupils equal, round, reactive to light. Extraocular muscles intact. No scleral icterus.  MOUTH: Moist mucosal membrane. Dentition intact. No abscess noted.  EAR, NOSE, THROAT: Clear without exudates. No external lesions.  NECK: Supple. No thyromegaly. No nodules. No JVD.  PULMONARY: Diffuse coarse rhonchi right sided +wheezes CARDIOVASCULAR: S1 and S2. Regular rate and rhythm. No murmurs, rubs, or gallops. No edema. Pedal pulses 2+ bilaterally.  GASTROINTESTINAL: Soft, nontender, nondistended. No masses. Positive bowel sounds. No hepatosplenomegaly.  MUSCULOSKELETAL: No swelling, clubbing, or edema. Range of motion full in all extremities.  NEUROLOGIC: Cranial nerves II through XII are intact. No gross focal neurological deficits. Sensation intact. Reflexes intact.  SKIN: No ulceration, lesions, rashes, or cyanosis. Skin warm and dry. Turgor intact.  PSYCHIATRIC: Mood, affect within normal limits. The patient is awake, alert and oriented x 3. Insight, judgment intact.       IMAGING    CT ABDOMEN PELVIS WO CONTRAST  Result Date: 11/17/2020 CLINICAL DATA:  75 year old male with abdominal and pelvic pain. EXAM: CT ABDOMEN AND PELVIS WITHOUT CONTRAST TECHNIQUE: Multidetector CT imaging of the abdomen and pelvis  was performed following the standard protocol without IV contrast. COMPARISON:  None. FINDINGS: Please note the parenchymal and vascular abnormalities [place] be missed is intravenous contrast was not administered. Lower chest: Focal LEFT basilar consolidation/atelectasis noted. Hepatobiliary: The liver and gallbladder are unremarkable. No biliary dilatation. Pancreas: Unremarkable Spleen: Unremarkable Adrenals/Urinary Tract: The kidneys, adrenal glands and bladder are unremarkable except for a 7 mm nonobstructing mid RIGHT renal calculus. Stomach/Bowel: Stomach is within normal limits. Appendix appears normal. No evidence of bowel wall thickening, distention, or inflammatory changes. Vascular/Lymphatic: Aortic atherosclerosis. No enlarged abdominal or pelvic lymph nodes. Reproductive: Prostate is unremarkable. Other: No ascites, focal collection or pneumoperitoneum. Musculoskeletal: No acute or suspicious bony abnormalities are noted. Posterior fusion changes at L4-L5 noted. IMPRESSION: 1. No evidence of acute abnormality within the abdomen or pelvis. 2. Focal LEFT basilar consolidation/atelectasis. Pneumonia is not excluded. 3. 7 mm nonobstructing RIGHT renal calculus. 4. Aortic Atherosclerosis (ICD10-I70.0). Electronically Signed   By: Margarette Canada M.D.   On: 11/17/2020 18:09   DG Chest 2 View  Result Date: 11/17/2020 CLINICAL DATA:  Cough and shortness of breath EXAM: CHEST - 2 VIEW COMPARISON:  01/17/2020 and prior studies FINDINGS: Cardiomegaly again noted. Mild LEFT LOWER lobe interstitial opacities are noted. There is no evidence of focal airspace disease, pulmonary edema, suspicious pulmonary nodule/mass, pleural effusion, or pneumothorax. No acute bony abnormalities are identified. IMPRESSION: Mild nonspecific LEFT LOWER lobe interstitial opacities which Preuss represent infection. Electronically Signed   By: Margarette Canada M.D.   On: 11/17/2020 12:17   DG Shoulder Right  Result Date: 11/18/2020 CLINICAL  DATA:  Right shoulder pain EXAM: RIGHT SHOULDER - 2+ VIEW COMPARISON:  None. FINDINGS: No fracture or dislocation. Moderate degenerative changes of the acromioclavicular joint. Soft tissues are normal. IMPRESSION: No acute osseous injury of the right shoulder. Electronically Signed   By: Kathreen Devoid M.D.   On: 11/18/2020 11:05   CT HEAD WO CONTRAST (5MM)  Result  Date: 11/17/2020 CLINICAL DATA:  Altered mental status.  On anticoagulation. EXAM: CT HEAD WITHOUT CONTRAST TECHNIQUE: Contiguous axial images were obtained from the base of the skull through the vertex without intravenous contrast. COMPARISON:  Head CT 01/17/2020 FINDINGS: Brain: Age related atrophy. No intracranial hemorrhage, mass effect, or midline shift. No hydrocephalus. The basilar cisterns are patent. Remote lacunar infarcts in the left basal ganglia unchanged. Mild to moderate periventricular chronic small vessel ischemia. No evidence of territorial infarct or acute ischemia. No extra-axial or intracranial fluid collection. Vascular: Atherosclerosis of skullbase vasculature without hyperdense vessel or abnormal calcification. Skull: No fracture or focal lesion. Sinuses/Orbits: Progressive mucosal thickening throughout the paranasal sinus, with subtotal opacification of all paranasal sinuses. Bubbly debris with mucosal thickening in the maxillary sinuses. No mastoid effusion. No acute orbital findings. Other: None.  Scalp soft tissues are unremarkable. IMPRESSION: 1. No acute intracranial abnormality. 2. Age related atrophy and chronic small vessel ischemia. Remote lacunar infarcts in the left basal ganglia. 3. Progressive paranasal sinus disease. Bubbly debris in the maxillary sinuses. Recommend correlation for clinical signs and symptoms of sinusitis. Electronically Signed   By: Keith Rake M.D.   On: 11/17/2020 18:05   CT L-SPINE NO CHARGE  Result Date: 11/17/2020 CLINICAL DATA:  Fall.  Lumbar fixation. EXAM: CT LUMBAR SPINE  WITHOUT CONTRAST TECHNIQUE: Multidetector CT imaging of the lumbar spine was performed without intravenous contrast administration. Multiplanar CT image reconstructions were also generated. COMPARISON:  Lumbar spine radiographs 01/17/2020 FINDINGS: Segmentation: 5 non rib-bearing lumbar type vertebral bodies are present. The lowest fully formed vertebral body is L5. Alignment: No significant listhesis is present. Mild straightening of the normal lumbar lordosis is present. Vertebrae: Vertebral body heights are maintained. Fused anterior osteophytes are present in the lower thoracic spine to the L1 level. Fusion is noted at L4-5. Hardware is intact. Paraspinal and other soft tissues: Atherosclerotic calcifications are present the aorta and branch vessels without aneurysm. Disc levels: T12-L1: Negative. L1-2: Mild facet hypertrophy is noted bilaterally. No significant disc protrusion or stenosis is present. L2-3: A broad-based disc protrusion is present. Moderate facet hypertrophy is noted bilaterally. No significant stenosis is present. L3-4: A broad-based disc protrusion is present. Advanced facet hypertrophy is noted. Moderate left central and foraminal narrowing is present. L4-5: Solid fusion is present. Laminectomy noted. No residual or recurrent stenosis is present. L5-S1: Mild broad-based disc bulging is present. Advanced facet hypertrophy is noted bilaterally. Mild bilateral foraminal narrowing is present. IMPRESSION: 1. No acute trauma 2. Solid fusion at L4-5 without residual or recurrent stenosis. 3. Adjacent level disease at L3-4 with moderate left central and foraminal narrowing. 4. Mild bilateral foraminal narrowing at L5-S1. 5. DISH 6. Aortic Atherosclerosis (ICD10-I70.0). Electronically Signed   By: San Morelle M.D.   On: 11/17/2020 18:07   DG Chest Portable 1 View  Result Date: 11/18/2020 CLINICAL DATA:  Difficulty breathing EXAM: PORTABLE CHEST 1 VIEW COMPARISON:  11/17/2020 FINDINGS:  Transverse diameter of heart is increased. Central pulmonary vessels are prominent. There is poor inspiration. Increased interstitial markings are seen in the parahilar regions and lower lung fields, more so on the left side. There is no new focal pulmonary consolidation. Lateral CP angles are clear. There is no pneumothorax. IMPRESSION: Cardiomegaly. Central pulmonary vessels are more prominent which Reali be due to poor inspiration or suggest CHF. Increased interstitial markings are seen in parahilar regions and lower lung fields, more so on the left side suggesting asymmetric pulmonary edema or underlying interstitial pneumonitis. There is no  focal pulmonary consolidation or significant pleural effusion. Electronically Signed   By: Elmer Picker M.D.   On: 11/18/2020 11:06   ECHOCARDIOGRAM COMPLETE  Result Date: 11/19/2020    ECHOCARDIOGRAM REPORT   Patient Name:   [name] Hufstetler Casto Jr. Date of Exam: 11/18/2020 Medical Rec #:  [id number]       Height:       68.0 in Accession #:    [id number]      Weight:       240.0 lb Date of Birth:  Dec 20, 1945        BSA:          2.208 m Patient Age:    45 years        BP:           143/98 mmHg Patient Gender: M               HR:           95 bpm. Exam Location:  [facility] Procedure: 2D Echo and Intracardiac Opacification Agent Indications:     Systolic CHF  History:         Patient has no prior history of Echocardiogram examinations.                  CAD, Prior CABG; Risk Factors:Hypertension, Dyslipidemia and                  Diabetes.  Sonographer:     L Thornton-Maynard Referring Phys:  [id number] Karie Kirks Diagnosing Phys: Serafina Royals MD  Sonographer Comments: Suboptimal apical window. IMPRESSIONS  1. Left ventricular ejection fraction, by estimation, is 60 to 65%. The left ventricle has normal function. The left ventricle has no regional wall motion abnormalities. Left ventricular diastolic parameters were normal.  2. Right ventricular systolic function is normal. The right  ventricular size is normal. There is mildly elevated pulmonary artery systolic pressure.  3. The mitral valve is normal in structure. Trivial mitral valve regurgitation.  4. The aortic valve is normal in structure. Aortic valve regurgitation is not visualized. FINDINGS  Left Ventricle: Left ventricular ejection fraction, by estimation, is 60 to 65%. The left ventricle has normal function. The left ventricle has no regional wall motion abnormalities. Definity contrast agent was given IV to delineate the left ventricular  endocardial borders. The left ventricular internal cavity size was normal in size. There is no left ventricular hypertrophy. Left ventricular diastolic parameters were normal. Right Ventricle: The right ventricular size is normal. No increase in right ventricular wall thickness. Right ventricular systolic function is normal. There is mildly elevated pulmonary artery systolic pressure. The tricuspid regurgitant velocity is 2.88  m/s, and with an assumed right atrial pressure of 3 mmHg, the estimated right ventricular systolic pressure is 71.0 mmHg. Left Atrium: Left atrial size was normal in size. Right Atrium: Right atrial size was normal in size. Pericardium: There is no evidence of pericardial effusion. Mitral Valve: The mitral valve is normal in structure. Trivial mitral valve regurgitation. Tricuspid Valve: The tricuspid valve is normal in structure. Tricuspid valve regurgitation is trivial. Aortic Valve: The aortic valve is normal in structure. Aortic valve regurgitation is not visualized. Aortic valve mean gradient measures 3.0 mmHg. Aortic valve peak gradient measures 4.9 mmHg. Aortic valve area, by VTI measures 2.26 cm. Pulmonic Valve: The pulmonic valve was normal in structure. Pulmonic valve regurgitation is not visualized. Aorta: The aortic root and ascending aorta are structurally normal, with no evidence of dilitation. IAS/Shunts: No  atrial level shunt detected by color flow Doppler.   LEFT VENTRICLE PLAX 2D LVIDd:         3.80 cm     Diastology LVIDs:         2.35 cm     LV e' medial:    8.16 cm/s LV PW:         1.40 cm     LV E/e' medial:  10.0 LV IVS:        1.40 cm     LV e' lateral:   8.70 cm/s LVOT diam:     2.20 cm     LV E/e' lateral: 9.4 LV SV:         46 LV SV Index:   21 LVOT Area:     3.80 cm  LV Volumes (MOD) LV vol d, MOD A2C: 87.4 ml LV vol d, MOD A4C: 83.1 ml LV vol s, MOD A2C: 15.3 ml LV vol s, MOD A4C: 21.7 ml LV SV MOD A2C:     72.1 ml LV SV MOD A4C:     83.1 ml LV SV MOD BP:      67.0 ml RIGHT VENTRICLE RV S prime:     8.38 cm/s LEFT ATRIUM             Index LA diam:        4.50 cm 2.04 cm/m LA Vol (A2C):   79.2 ml 35.86 ml/m LA Vol (A4C):   57.0 ml 25.81 ml/m LA Biplane Vol: 67.2 ml 30.43 ml/m  AORTIC VALVE                    PULMONIC VALVE AV Area (Vmax):    2.33 cm     PV Vmax:       0.84 m/s AV Area (Vmean):   2.42 cm     PV Peak grad:  2.8 mmHg AV Area (VTI):     2.26 cm AV Vmax:           111.00 cm/s AV Vmean:          76.400 cm/s AV VTI:            0.202 m AV Peak Grad:      4.9 mmHg AV Mean Grad:      3.0 mmHg LVOT Vmax:         67.90 cm/s LVOT Vmean:        48.700 cm/s LVOT VTI:          0.120 m LVOT/AV VTI ratio: 0.59  AORTA Ao Root diam: 2.80 cm Ao Asc diam:  3.60 cm MITRAL VALVE               TRICUSPID VALVE MV Area (PHT): 3.72 cm    TR Peak grad:   33.2 mmHg MV Decel Time: 204 msec    TR Vmax:        288.00 cm/s MV E velocity: 81.40 cm/s                            SHUNTS                            Systemic VTI:  0.12 m                            Systemic Diam: 2.20 cm Bruce  Nehemiah Massed MD Electronically signed by Serafina Royals MD Signature Date/Time: 11/19/2020/8:52:08 AM    Final       ASSESSMENT/PLAN   Acute hypoxemic respiratory failure, ( pulmoanry edema, ? Pneumonia ( elevated pro calcitonin), copd. improved  mental staus, in the bathroom off o2, wife  at bed side. He is quite weak and walks with walker and help. - supplemental O2 during my  evaluation 3-4 -PT/OT for d/c planning  -please encourage patient to use incentive spirometer few times each hour while hospitalized.   -continue present antibiotic regimen -follow up omn pending serologies       Thank you for allowing me to participate in the care of this patient.   Patient/Family are satisfied with care plan and all questions have been answered.  This document was prepared using Dragon voice recognition software and [place] include unintentional dictation errors.     Wallene Huh, M.D.  Division of [place]

## 2020-11-20 NOTE — Assessment & Plan Note (Signed)
Palliative care consult.  Patient is DNR.  [name] [name] be appropriate for transitioning to hospice in near future if no improvement

## 2020-11-20 NOTE — Plan of Care (Signed)

## 2020-11-20 NOTE — Progress Notes (Signed)
Physical Therapy Treatment Patient Details Name: [name] Tabar Locker Jr. MRN: [id number] DOB: Feb 03, 1945 Today's Date: 11/20/2020   History of Present Illness Pt is 75 yr old man with a known history of CAD, PAF, DM II, Peripheral neuropathy, hypertension, and history of stroke who according to his wife has intermittent episodes of confusion and disorientation at baseline. Pt presented to [facility] with increased short of breath with a progressively worsening cough for 4 days. He was admitted for acute hypoxic respiratory failure and acute metabolic encephalopathy    PT Comments    Pt received in supine position and agreeable to therapy.  Pt much more alert and eager to work with PT than yesterday's session.  Pt performed bed mobility with good technique, only requiring minA for trunk support and safety.  Pt able to come upright into standing with use of the FWW and CGA for safety.  Pt able to ambulate to the door and back to bed where he reports his legs were fatiguing quickly.  Pt transferred back to bed with minA and then left with all needs met and wife in room.    Pt and wife discussed utilizing SNF for short stint of rehab in order to regain strength to return home.  Due to pt's levels of fatigue, anticipate short term rehab in order to increase stamina and strength for improved mobility and QoL.     Recommendations for follow up therapy are one component of a multi-disciplinary discharge planning process, led by the attending physician.  Recommendations Fraser be updated based on patient status, additional functional criteria and insurance authorization.  Follow Up Recommendations  Skilled nursing-short term rehab (<3 hours/day)     Assistance Recommended at Discharge Frequent or constant Supervision/Assistance  Equipment Recommendations  None recommended by PT    Recommendations for Other Services       Precautions / Restrictions Precautions Precautions: Fall Restrictions Weight Bearing  Restrictions: No     Mobility  Bed Mobility Overal bed mobility: Needs Assistance Bed Mobility: Supine to Sit;Sit to Supine     Supine to sit: Min assist;HOB elevated Sit to supine: Min assist   General bed mobility comments: MIN A for trunk support in sitting upright, MIN A for trunk support when returning to supine.    Transfers Overall transfer level: Modified independent Equipment used: Rolling walker (2 wheels)                    Ambulation/Gait Ambulation/Gait assistance: Min guard Gait Distance (Feet): 30 Feet Assistive device: Rolling walker (2 wheels) Gait Pattern/deviations: Step-through pattern;Decreased stride length Gait velocity: decreased   General Gait Details: Pt with good ability to perform ambulation to the door and back to the bed before reporting significant amount of fatigue.  Heavy reliance on UE's and difficulty performing due to coughing spells.   Stairs             Wheelchair Mobility    Modified Rankin (Stroke Patients Only)       Balance Overall balance assessment: Needs assistance Sitting-balance support: No upper extremity supported;Feet supported Sitting balance-Leahy Scale: Fair     Standing balance support: Bilateral upper extremity supported;During functional activity Standing balance-Leahy Scale: Fair                              Cognition Arousal/Alertness: Awake/alert Behavior During Therapy: WFL for tasks assessed/performed Overall Cognitive Status: History of cognitive impairments - at baseline  General Comments: Pt in much better mentation today and state he feels better.        Exercises      General Comments General comments (skin integrity, edema, etc.): Pt with resting HR's in 100's-110's.  Upon standing, HR remained in 100's and elevated slightly once ambulating.      Pertinent Vitals/Pain Pain Assessment: No/denies pain Pain  Intervention(s): Monitored during session    Home Living                          Prior Function            PT Goals (current goals can now be found in the care plan section) Acute Rehab PT Goals Patient Stated Goal: to go home PT Goal Formulation: With patient Time For Goal Achievement: 12/03/20 Potential to Achieve Goals: Good Progress towards PT goals: Progressing toward goals    Frequency    Min 2X/week      PT Plan Current plan remains appropriate    Co-evaluation              AM-PAC PT "6 Clicks" Mobility   Outcome Measure  Help needed turning from your back to your side while in a flat bed without using bedrails?: A Little Help needed moving from lying on your back to sitting on the side of a flat bed without using bedrails?: A Little Help needed moving to and from a bed to a chair (including a wheelchair)?: A Little Help needed standing up from a chair using your arms (e.g., wheelchair or bedside chair)?: A Little Help needed to walk in hospital room?: A Lot Help needed climbing 3-5 steps with a railing? : A Lot 6 Click Score: 16    End of Session Equipment Utilized During Treatment: Gait belt Activity Tolerance: Patient limited by fatigue Patient left: in bed;with call bell/phone within reach;with bed alarm set Nurse Communication: Mobility status PT Visit Diagnosis: Unsteadiness on feet (R26.81);Muscle weakness (generalized) (M62.81);Difficulty in walking, not elsewhere classified (R26.2)     Time: 1530-1601 PT Time Calculation (min) (ACUTE ONLY): 31 min  Charges:  $Gait Training: 8-22 mins $Therapeutic Activity: 8-22 mins                      , PT, DPT 11/20/20, 5:13 PM     W  11/20/2020, 5:03 PM  

## 2020-11-20 NOTE — Assessment & Plan Note (Addendum)
Continue statin and Eliquis.  As blood pressure is improved we can restart metoprolol at a lower dose

## 2020-11-21 ENCOUNTER — Inpatient Hospital Stay: Payer: Medicare HMO

## 2020-11-21 DIAGNOSIS — I5033 Acute on chronic diastolic (congestive) heart failure: Secondary | ICD-10-CM

## 2020-11-21 DIAGNOSIS — F32A Depression, unspecified: Secondary | ICD-10-CM

## 2020-11-21 DIAGNOSIS — G9341 Metabolic encephalopathy: Secondary | ICD-10-CM

## 2020-11-21 DIAGNOSIS — F331 Major depressive disorder, recurrent, moderate: Secondary | ICD-10-CM | POA: Diagnosis not present

## 2020-11-21 DIAGNOSIS — Z515 Encounter for palliative care: Secondary | ICD-10-CM

## 2020-11-21 DIAGNOSIS — E1169 Type 2 diabetes mellitus with other specified complication: Secondary | ICD-10-CM | POA: Diagnosis not present

## 2020-11-21 DIAGNOSIS — Z66 Do not resuscitate: Secondary | ICD-10-CM

## 2020-11-21 DIAGNOSIS — J189 Pneumonia, unspecified organism: Secondary | ICD-10-CM | POA: Diagnosis not present

## 2020-11-21 LAB — BASIC METABOLIC PANEL
Anion gap: 6 (ref 5–15)
BUN: 14 mg/dL (ref 8–23)
CO2: 34 mmol/L — ABNORMAL HIGH (ref 22–32)
Calcium: 10.9 mg/dL — ABNORMAL HIGH (ref 8.9–10.3)
Chloride: 99 mmol/L (ref 98–111)
Creatinine, Ser: 0.76 mg/dL (ref 0.61–1.24)
GFR, Estimated: >60 mL/min (ref >60)
Glucose, Bld: 153 mg/dL — ABNORMAL HIGH (ref 70–99)
Potassium: 4.5 mmol/L (ref 3.5–5.1)
Sodium: 139 mmol/L (ref 135–145)

## 2020-11-21 LAB — CBC
HCT: 39.1 % (ref 39.0–52.0)
Hemoglobin: 13.6 g/dL (ref 13.0–17.0)
MCH: 34.6 pg — ABNORMAL HIGH (ref 26.0–34.0)
MCHC: 34.8 g/dL (ref 30.0–36.0)
MCV: 99.5 fL (ref 80.0–100.0)
Platelets: 171 10*3/uL (ref 150–400)
RBC: 3.93 MIL/uL — ABNORMAL LOW (ref 4.22–5.81)
RDW: 13.3 % (ref 11.5–15.5)
WBC: 6.4 10*3/uL (ref 4.0–10.5)
nRBC: 0 % (ref 0.0–0.2)

## 2020-11-21 LAB — BLOOD GAS, VENOUS
Acid-Base Excess: 13.1 mmol/L — ABNORMAL HIGH (ref 0.0–2.0)
Bicarbonate: 38.2 mmol/L — ABNORMAL HIGH (ref 20.0–28.0)
O2 Saturation: 98.2 %
Patient temperature: 37
pCO2, Ven: 49 mmHg (ref 44.0–60.0)
pH, Ven: 7.5 — ABNORMAL HIGH (ref 7.250–7.430)
pO2, Ven: 98 mmHg — ABNORMAL HIGH (ref 32.0–45.0)

## 2020-11-21 LAB — AMMONIA
Ammonia: 57 umol/L — ABNORMAL HIGH (ref 9–35)
Ammonia: 34 umol/L (ref 9–35)

## 2020-11-21 LAB — FUNGITELL, SERUM: Fungitell Result: <31 pg/mL (ref <80)

## 2020-11-21 LAB — FOLATE: Folate: 66 ng/mL (ref >5.9)

## 2020-11-21 LAB — STREP PNEUMONIAE URINARY ANTIGEN: Strep Pneumo Urinary Antigen: NEGATIVE

## 2020-11-21 LAB — TSH: TSH: 1.502 u[IU]/mL (ref 0.350–4.500)

## 2020-11-21 LAB — VITAMIN B12: Vitamin B-12: 1300 pg/mL — ABNORMAL HIGH (ref 180–914)

## 2020-11-21 LAB — MAGNESIUM: Magnesium: 1.5 mg/dL — ABNORMAL LOW (ref 1.7–2.4)

## 2020-11-21 LAB — LEGIONELLA PNEUMOPHILA TOTAL AB: Legionella Pneumo Total Ab: <0.91 OD ratio (ref 0.00–0.90)

## 2020-11-21 MED ORDER — MIRTAZAPINE 15 MG PO TABS
15.0000 mg | ORAL_TABLET | Freq: Every day | ORAL | Status: DC
  Administered 2020-11-21 – 2020-11-28 (×8): 15 mg via ORAL
  Filled 2020-11-21 (×8): qty 1

## 2020-11-21 MED ORDER — MAGNESIUM SULFATE 2 GM/50ML IV SOLN
2.0000 g | Freq: Once | INTRAVENOUS | Status: AC
  Administered 2020-11-21: 2 g via INTRAVENOUS
  Filled 2020-11-21: qty 50

## 2020-11-21 MED ORDER — LACTULOSE 10 GM/15ML PO SOLN
20.0000 g | Freq: Every day | ORAL | Status: DC
  Administered 2020-11-22 – 2020-11-29 (×8): 20 g via ORAL
  Filled 2020-11-21 (×8): qty 30

## 2020-11-21 MED ORDER — IRBESARTAN 150 MG PO TABS
150.0000 mg | ORAL_TABLET | Freq: Every day | ORAL | Status: DC
  Administered 2020-11-21 – 2020-11-29 (×9): 150 mg via ORAL
  Filled 2020-11-21 (×9): qty 1

## 2020-11-21 MED ORDER — FUROSEMIDE 40 MG PO TABS
40.0000 mg | ORAL_TABLET | Freq: Every day | ORAL | Status: DC
  Administered 2020-11-21: 19:00:00 40 mg via ORAL
  Filled 2020-11-21: qty 1

## 2020-11-21 NOTE — Progress Notes (Signed)
PROGRESS NOTE    [name] [name] Jr.  XHB:[id number] DOB: 1945-04-11 DOA: 11/17/2020 PCP: Albina Billet, MD (Confirm with patient/family/NH records and if not entered, this HAS to be entered at [facility] point of entry. "No PCP" if truly none.)   Chief Complaint  Patient presents with   Altered Mental Status    Brief Narrative:  75 yr old man with [name] [name] known history of CAD, PAF, DM II, Peripheral neuropathy, Hypertension, and history of stroke who according to his wife has intermittent episodes of confusion and disorientation at baseline. He has been increasingly short of breath with [name] progressively worsening cough for 4 days.  He is admitted for acute hypoxic respiratory failure and acute metabolic encephalopathy   10/30 -possible aspiration event.  Oxygen requirement went up to 4 L.  Giving 40 of Lasix based on the chest x-ray showing pulmonary vascular congestion. 10/31: Weaned off to 2.5 L oxygen.  Pulmonary seen transferred to any MedSurg giving additional 40 mg of Lasix today PT, OT consult 11/1: PT,OT recommends SNF, palliative care consult  Assessment & Plan:   Principal Problem:   Depression Active Problems:   Peripheral neuropathy   Diabetes ([place])   Hyperlipidemia   Toxic metabolic encephalopathy   Coronary artery disease   Hypertension   Acute bronchitis due to infection   Community acquired pneumonia   Acute respiratory failure with hypoxia and hypercapnia (HCC)   Goals of care, counseling/discussion  Acute respiratory failure with hypoxia and hypercapnia (HCC) Currently on 2 L [place] CXR today with interval decrease in interstitial markings in both lungs, suggesting resolving pulm edema - small linear densities in medial L lower lung fields suggest subsegmental atelectassi Being treated for pneumonia and heart failure with lasix  Community acquired pneumonia Ceftriaxone/azithro 10/29 - present Pulmonary consult - recommened RVP (not collected), fungitell (negative), legionella  ab (pending), urine strep negative, histoplasma urine ag, sputum cx - pending collection, afb expectorated specimen (discussed with pulm who did not recommend isolation) Consider influenza vaccine prior to d/c   HFpEF exacerbation Edema improving Continue lasix as tolerated  Toxic metabolic encephalopathy Likely related to resp failure and infection Ammonia elevated - will repeat, Streb need lactulose B12 elevated, folate wnl, TSH wnl VBG without hypercarbia  Cognitive Deficit  Hallucinations Concerning for dementia Needs outpatient neurology  Essential Tremor Needs neuro follow outpt  Suicidal Ideation Psych c/s, appreciate assistance  Deconditioning PT/OT recommending SNF  Goals of care, counseling/discussion Palliative care consult.  Patient is DNR.  Appreciate palliative care.   Hypertension Metoprolol irbesartan   Coronary artery disease Continue statin and Eliquis (will need to review why eliquis?)   metoprolol    Hyperlipidemia Continue fenofibrate   Diabetes (HCC) Hemoglobin A1c of 6.  Blood sugars seem to be fairly well controlled   Peripheral neuropathy On Cymbalta  DVT prophylaxis: eliquis Code Status:DNR Family Communication: wife at bedside Disposition:   Status is: Inpatient  Remains inpatient appropriate because: continued acute hypoxic resp failure       Consultants:  pulmonology  Procedures: Echo IMPRESSIONS     1. Left ventricular ejection fraction, by estimation, is 60 to 65%. The  left ventricle has normal function. The left ventricle has no regional  wall motion abnormalities. Left ventricular diastolic parameters were  normal.   2. Right ventricular systolic function is normal. The right ventricular  size is normal. There is mildly elevated pulmonary artery systolic  pressure.   3. The mitral valve is normal in structure. Trivial mitral  valve  regurgitation.   4. The aortic valve is normal in structure. Aortic valve  regurgitation is  not visualized.   Antimicrobials:  Anti-infectives (From admission, onward)    Start     Dose/Rate Route Frequency Ordered Stop   11/17/20 1630  cefTRIAXone (ROCEPHIN) 2 g in sodium chloride 0.9 % 100 mL IVPB        2 g 200 mL/hr over 30 Minutes Intravenous Every 24 hours 11/17/20 1624 11/22/20 1629   11/17/20 1630  azithromycin (ZITHROMAX) 500 mg in sodium chloride 0.9 % 250 mL IVPB        500 mg 250 mL/hr over 60 Minutes Intravenous Every 24 hours 11/17/20 1624 11/22/20 1629          Subjective: Depressed mood Notes hallucinations, thoughts occasionally of self harm  Objective: Vitals:   11/21/20 1125 11/21/20 1142 11/21/20 1409 11/21/20 1555  BP: (!) 185/85 (!) 177/90  (!) 184/84  Pulse: 71 81  97  Resp: 18   18  Temp: 97.8 F (36.6 C)   97.6 F (36.4 C)  TempSrc:    Oral  SpO2: 94% 99% 93% 90%  Weight:      Height:        Intake/Output Summary (Last 24 hours) at 11/21/2020 1721 Last data filed at 11/21/2020 1500 Gross per 24 hour  Intake 726.81 ml  Output 2200 ml  Net -1473.19 ml   Filed Weights   11/17/20 1119  Weight: 108.9 kg    Examination:  General exam: Appears calm and comfortable  Respiratory system: unlabored Cardiovascular system: RRR Gastrointestinal system: Abdomen is nondistended, soft and nontender Central nervous system: Alert and oriented. No focal neurological deficits. Extremities: no LEE Skin: No rashes, lesions or ulcers Psychiatry: depressed mood    Data Reviewed: I have personally reviewed following labs and imaging studies  CBC: Recent Labs  Lab 11/17/20 1126 11/18/20 0624 11/19/20 0459 11/20/20 0613 11/21/20 0629  WBC 9.7 6.5 5.1 4.7 6.4  HGB 13.3 12.4* 13.1 12.9* 13.6  HCT 39.3 35.6* 37.0* 36.9* 39.1  MCV 97.8 97.3 99.2 97.4 99.5  PLT 199 162 145* 142* 299    Basic Metabolic Panel: Recent Labs  Lab 11/17/20 1126 11/18/20 0624 11/19/20 0459 11/20/20 0613 11/21/20 0629  NA 135 137 137  137 139  K 3.2* 3.4*  3.2* 2.9* 3.2* 4.5  CL 99 98 94* 95* 99  CO2 26 30 34* 35* 34*  GLUCOSE 153* 126* 140* 148* 153*  BUN 10 11 13 13 14   CREATININE 0.88 0.73 0.72 0.86 0.76  CALCIUM 9.7 8.9 9.4 10.0 10.9*  MG  --   --  1.2*  --  1.5*    GFR: Estimated Creatinine Clearance: 98.4 mL/min (by C-G formula based on SCr of 0.76 mg/dL).  Liver Function Tests: Recent Labs  Lab 11/17/20 1126 11/18/20 0624  AST 24 20  ALT 21 19  ALKPHOS 32* 33*  BILITOT 0.9 0.8  PROT 6.4* 6.0*  ALBUMIN 2.9* 2.8*    CBG: No results for input(s): GLUCAP in the last 168 hours.   Recent Results (from the past 240 hour(s))  Urine Culture     Status: None (Preliminary result)   Collection Time: 11/17/20 12:31 PM   Specimen: In/Out Cath Urine  Result Value Ref Range Status   Specimen Description   Final    IN/OUT CATH URINE Performed at [facility], [address] [place], [place] [address]    Special Requests   Final  NONE Performed at [facility], [address], [place], [place] [address]    Culture   Final    CULTURE REINCUBATED FOR BETTER GROWTH Performed at [facility], [place] [address] [place], [place] [address]    Report Status PENDING  Incomplete  Blood culture (routine single)     Status: None (Preliminary result)   Collection Time: 11/17/20  3:42 PM   Specimen: BLOOD  Result Value Ref Range Status   Specimen Description BLOOD RIGHT ANTECUBITAL  Final   Special Requests   Final    BOTTLES DRAWN AEROBIC AND ANAEROBIC Blood Culture adequate volume   Culture   Final    NO GROWTH 4 DAYS Performed at [facility], [address], [place], [place] [address]    Report Status PENDING  Incomplete  Resp Panel by RT-PCR (Flu [employer], Covid) Nasopharyngeal Swab     Status: None   Collection Time: 11/17/20  3:56 PM   Specimen: Nasopharyngeal Swab; Nasopharyngeal(NP) swabs in vial transport medium  Result Value Ref Range Status   SARS Coronavirus 2  by RT PCR NEGATIVE NEGATIVE Final    Comment: (NOTE) SARS-CoV-2 target nucleic acids are NOT DETECTED.  The SARS-CoV-2 RNA is generally detectable in upper respiratory specimens during the acute phase of infection. The lowest concentration of SARS-CoV-2 viral copies this assay can detect is 138 copies/mL. [name] [name] negative result does not preclude SARS-Cov-2 infection and should not be used as the sole basis for treatment or other patient management decisions. [name] [name] negative result [name] [name] occur with  improper specimen collection/handling, submission of specimen other than nasopharyngeal swab, presence of viral mutation(s) within the areas targeted by this assay, and inadequate number of viral copies(<138 copies/mL). [name] [name] negative result must be combined with clinical observations, patient history, and epidemiological information. The expected result is Negative.  Fact Sheet for Patients:  EntrepreneurPulse.com.au  Fact Sheet for Healthcare Providers:  IncredibleEmployment.be  This test is no t yet approved or cleared by the [place] FDA and  has been authorized for detection and/or diagnosis of SARS-CoV-2 by FDA under an Emergency Use Authorization (EUA). This EUA will remain  in effect (meaning this test can be used) for the duration of the COVID-19 declaration under Section 564(b)(1) of the Act, 21 U.S.C.section 360bbb-3(b)(1), unless the authorization is terminated  or revoked sooner.       Influenza Makaila Windle by PCR NEGATIVE NEGATIVE Final   Influenza B by PCR NEGATIVE NEGATIVE Final    Comment: (NOTE) The Xpert Xpress SARS-CoV-2/FLU/RSV plus assay is intended as an aid in the diagnosis of influenza from Nasopharyngeal swab specimens and should not be used as [name] [name] sole basis for treatment. Nasal washings and aspirates are unacceptable for Xpert Xpress SARS-CoV-2/FLU/RSV testing.  Fact Sheet for Patients: EntrepreneurPulse.com.au  Fact  Sheet for Healthcare Providers: IncredibleEmployment.be  This test is not yet approved or cleared by the [place] FDA and has been authorized for detection and/or diagnosis of SARS-CoV-2 by FDA under an Emergency Use Authorization (EUA). This EUA will remain in effect (meaning this test can be used) for the duration of the COVID-19 declaration under Section 564(b)(1) of the Act, 21 U.S.C. section 360bbb-3(b)(1), unless the authorization is terminated or revoked.  Performed at [facility], [address] [place], [place] [address]   Blood culture (single)     Status: None (Preliminary result)   Collection Time: 11/17/20  4:43 PM   Specimen: BLOOD  Result Value Ref Range Status   Specimen  Description BLOOD RIGHT HAND  Final   Special Requests   Final    BOTTLES DRAWN AEROBIC AND ANAEROBIC Blood Culture results [place] not be optimal due to an inadequate volume of blood received in culture bottles   Culture   Final    NO GROWTH 4 DAYS Performed at [facility], [address], [place], [place] [address]    Report Status PENDING  Incomplete         Radiology Studies: DG Chest Port 1 View  Result Date: 11/21/2020 CLINICAL DATA:  Difficulty breathing EXAM: PORTABLE CHEST 1 VIEW COMPARISON:  Previous studies including the examination of 11/18/2020 FINDINGS: Transverse diameter of heart is increased. There are no signs of alveolar pulmonary edema or new focal infiltrates. There is interval decrease in interstitial markings in the parahilar regions and lower lung fields. Linear densities seen in medial left lower lung fields. Costophrenic angles are clear. There is no pneumothorax. IMPRESSION: Cardiomegaly. There is interval decrease in interstitial markings in both lungs suggesting resolving pulmonary edema. Small linear densities in the medial left lower lung fields suggest subsegmental atelectasis. Electronically Signed   By: Elmer Picker  M.D.   On: 11/21/2020 11:12        Scheduled Meds:  albuterol  2.5 mg Nebulization Q6H   apixaban  5 mg Oral BID   bisacodyl  5 mg Oral Daily   cholecalciferol  1,000 Units Oral Daily   DULoxetine  30 mg Oral BID   fenofibrate  160 mg Oral Daily   folic acid  1 mg Oral Daily   guaiFENesin  1,200 mg Oral BID   influenza vaccine adjuvanted  0.5 mL Intramuscular Once   irbesartan  150 mg Oral Daily   metoprolol tartrate  25 mg Oral BID   mirtazapine  15 mg Oral QHS   multivitamin with minerals  1 tablet Oral Daily   senna-docusate  2 tablet Oral BID   simvastatin  40 mg Oral QHS   Continuous Infusions:  sodium chloride 10 mL/hr (11/18/20 1818)   azithromycin 500 mg (11/20/20 1750)   cefTRIAXone (ROCEPHIN)  IV 2 g (11/20/20 1646)     LOS: 4 days    Time spent: over 86 min    Fayrene Helper, MD [facility]   To contact the attending provider between 7A-7P or the covering provider during after hours 7P-7A, please log into the web site [email or web address] and access using universal [place] password for that web site. If you do not have the password, please call the hospital operator.  11/21/2020, 5:21 PM

## 2020-11-21 NOTE — Plan of Care (Signed)
  Problem: Clinical Measurements: Goal: Diagnostic test results will improve Outcome: Progressing   Problem: Safety: Goal: Ability to remain free from injury will improve Outcome: Progressing   Problem: Skin Integrity: Goal: Risk for impaired skin integrity will decrease Outcome: Progressing   

## 2020-11-21 NOTE — TOC Progression Note (Addendum)
Transition of Care (TOC) - Progression Note    Patient Details  Name: [name] [name]. MRN: [id number] Date of Birth: 1945/06/11  Transition of [facility]) CM/SW [place], [place] Phone Number: 11/21/2020, 2:48 PM  Clinical Narrative:     Update: Spouse [name] called back and reports preference for SNF near [place]. Informed that referrals were sent out and pending bed offer.     CSW lvm with patient's spouse Sydell Axon requesting if she has any SNF preference for facility, previously noted her and patient are in agreement with short term rehab.   CSW has sent referrals pending bed offers at this time.   Expected Discharge Plan: [place] Barriers to Discharge: Continued Medical Work up  Expected Discharge Plan and Services Expected Discharge Plan: [place] In-house Referral: Clinical Social Work   Post Acute Care Choice: [place] Living arrangements for the past 2 months: Single Family Home                                       Social Determinants of Health (SDOH) Interventions    Readmission Risk Interventions No flowsheet data found.

## 2020-11-21 NOTE — NC FL2 (Signed)
[place] LEVEL OF CARE SCREENING TOOL     IDENTIFICATION  Patient Name: [name] Baggerly Strayer Jr. Birthdate: [birth year] Sex: male Admission Date (Current Location): 11/17/2020  [facility] and [place] Number:  Engineering geologist and Address:  [facility], [address], [place], [place] [address]      Provider Number: [id number]  Attending Physician Name and Address:  Elodia Florence., *  Relative Name and Phone Number:  Sydell Axon (spouse) [phone]    Current Level of Care: Hospital Recommended Level of Care: [place] Prior Approval Number:    Date Approved/Denied:   PASRR Number: [id number]  Discharge Plan: SNF    Current Diagnoses: Patient Active Problem List   Diagnosis Date Noted   Goals of care, counseling/discussion 11/20/2020   Acute respiratory failure with hypoxia and hypercapnia ([place]) 24/09/7351   Toxic metabolic encephalopathy 29/92/4268   Coronary artery disease    Hypertension    Acute bronchitis due to infection    Community acquired pneumonia    Diabetes ([place]) 01/08/2016   Hyperlipidemia 01/08/2016   Pain in limb 01/08/2016   Insomnia 10/27/2012   Peripheral neuropathy 03/09/2012   Lumbar stenosis 12/04/2011    Orientation RESPIRATION BLADDER Height & Weight     Self, Situation  O2 (2L nasal cannula) Incontinent, External catheter Weight: 240 lb (108.9 kg) Height:  5\' 8"  (172.7 cm)  BEHAVIORAL SYMPTOMS/MOOD NEUROLOGICAL BOWEL NUTRITION STATUS      Continent Diet (see discharge summary)  AMBULATORY STATUS COMMUNICATION OF NEEDS Skin   Limited Assist Verbally Other (Comment) (skin tear anterior right arm)                       Personal Care Assistance Level of Assistance  Bathing, Feeding, Total care, Dressing Bathing Assistance: Limited assistance Feeding assistance: Independent Dressing Assistance: Limited assistance Total Care Assistance: Limited assistance   Functional Limitations  Info  Sight, Hearing, Speech Sight Info: Adequate Hearing Info: Adequate Speech Info: Adequate    SPECIAL CARE FACTORS FREQUENCY  PT (By licensed PT), OT (By licensed OT)     PT Frequency: min 4x weekly OT Frequency: min 4x weekly            Contractures Contractures Info: Not present    Additional Factors Info  Code Status, Allergies Code Status Info: DNR Allergies Info: no known allergies           Current Medications (11/21/2020):  This is the current hospital active medication list Current Facility-Administered Medications  Medication Dose Route Frequency Provider Last Rate Last Admin   0.9 %  sodium chloride infusion   Intravenous PRN Swayze, Ava, DO 10 mL/hr at 11/18/20 1818 10 mL/hr at 11/18/20 1818   acetaminophen (TYLENOL) tablet 650 mg  650 mg Oral Q6H PRN Swayze, Ava, DO       Or   acetaminophen (TYLENOL) suppository 650 mg  650 mg Rectal Q6H PRN Swayze, Ava, DO       albuterol (PROVENTIL) (2.5 MG/3ML) 0.083% nebulizer solution 2.5 mg  2.5 mg Nebulization Q6H Swayze, Ava, DO   2.5 mg at 11/21/20 1409   apixaban (ELIQUIS) tablet 5 mg  5 mg Oral BID Swayze, Ava, DO   5 mg at 11/21/20 0839   azithromycin (ZITHROMAX) 500 mg in sodium chloride 0.9 % 250 mL IVPB  500 mg Intravenous Q24H Vladimir Crofts, MD 250 mL/hr at 11/20/20 1750 500 mg at 11/20/20 1750   bisacodyl (DULCOLAX) EC tablet  5 mg  5 mg Oral Daily Max Sane, MD   5 mg at 11/21/20 0839   cefTRIAXone (ROCEPHIN) 2 g in sodium chloride 0.9 % 100 mL IVPB  2 g Intravenous Q24H Vladimir Crofts, MD 200 mL/hr at 11/20/20 1646 2 g at 11/20/20 1646   cholecalciferol (VITAMIN D3) tablet 1,000 Units  1,000 Units Oral Daily Swayze, Ava, DO   1,000 Units at 11/21/20 0839   DULoxetine (CYMBALTA) DR capsule 30 mg  30 mg Oral BID Swayze, Ava, DO   30 mg at 11/21/20 0839   fenofibrate tablet 160 mg  160 mg Oral Daily Swayze, Ava, DO   160 mg at 47/42/59 5638   folic acid (FOLVITE) tablet 1 mg  1 mg Oral Daily Swayze, Ava, DO   1  mg at 11/21/20 0839   guaiFENesin (MUCINEX) 12 hr tablet 1,200 mg  1,200 mg Oral BID Swayze, Ava, DO   1,200 mg at 11/21/20 0838   guaiFENesin (ROBITUSSIN) 100 MG/5ML liquid 5 mL  5 mL Oral Q4H PRN Max Sane, MD   5 mL at 11/21/20 1404   haloperidol lactate (HALDOL) injection 5 mg  5 mg Intravenous Q6H PRN Swayze, Ava, DO       influenza vaccine adjuvanted (FLUAD) injection 0.5 mL  0.5 mL Intramuscular Once Swayze, Ava, DO       irbesartan (AVAPRO) tablet 150 mg  150 mg Oral Daily Elodia Florence., MD   150 mg at 11/21/20 1404   magnesium sulfate IVPB 2 g 50 mL  2 g Intravenous Once Elodia Florence., MD 50 mL/hr at 11/21/20 1408 2 g at 11/21/20 1408   metoprolol tartrate (LOPRESSOR) tablet 25 mg  25 mg Oral BID Max Sane, MD   25 mg at 11/21/20 7564   multivitamin with minerals tablet 1 tablet  1 tablet Oral Daily Swayze, Ava, DO   1 tablet at 11/21/20 0839   ondansetron (ZOFRAN) tablet 4 mg  4 mg Oral Q6H PRN Swayze, Ava, DO       Or   ondansetron (ZOFRAN) injection 4 mg  4 mg Intravenous Q6H PRN Swayze, Ava, DO       senna-docusate (Senokot-S) tablet 2 tablet  2 tablet Oral BID Max Sane, MD   2 tablet at 11/21/20 0839   simvastatin (ZOCOR) tablet 40 mg  40 mg Oral QHS Swayze, Ava, DO   40 mg at 11/20/20 2130     Discharge Medications: Please see discharge summary for a list of discharge medications.  Relevant Imaging Results:  Relevant Lab Results:   Additional Information SSN: [id number]  Alberteen Sam, LCSW

## 2020-11-21 NOTE — Consult Note (Signed)
Consultation Note Date: 11/21/2020   Patient Name: [name] Luft Ladson Jr.  DOB: 28-Jun-1945  MRN: [id number]  Age / Sex: 75 y.o., male  PCP: Albina Billet, MD Referring Physician: Elodia Florence., *  Reason for Consultation: Establishing goals of care  HPI/Patient Profile: 75 y.o. male  with past medical history of CAD, PAF, diabetes, peripheral neuropathy, hypertension, history of stroke, and intermittent episodes of confusion admitted on 11/17/2020 with increasing shortness of breath at home.  Patient admitted for acute hypoxic respiratory failure and acute metabolic encephalopathy.  Palliative medicine was consulted to discuss goals of care.   Clinical Assessment and Goals of Care: I have reviewed medical records including EPIC notes, labs and imaging,  assessed the patient and then met with patient and his wife at bedside to discuss diagnosis prognosis, GOC, EOL wishes, disposition and options.  Patient is somewhat confused during our conversation.  He starts speaking on topics unrelated to our conversation.  While a conversation was had with both the patient and his wife, his wife is the historian.  I introduced Palliative Medicine as specialized medical care for people living with serious illness. It focuses on providing relief from the symptoms and stress of a serious illness. The goal is to improve quality of life for both the patient and the family.  We discussed a brief life review of the patient.  The patient and his wife have been married for 43 years.  They have children and grandchildren nearby that they are close with.   As far as functional and nutritional status the wife endorses the patient was not eating or sleeping well for about 1 week prior to admission.  She endorses he has had intermittent confusion over the last several years but that it seems to increase over the last month and then was  consistent over the last 3 or 4 days prior to admission.  She endorses he has not left the house in several months.  He apparently had an episode where he fell in a grocery store bathroom and she had to come in and help pick him up.  She thinks this is what made him afraid of going out and has made him mostly homebound over the last several months.  She reports extensive daytime sleepiness.  She shares that if he did not spend the entire day in the bed that he would walk to his recliner and sleep in his recliner for several hours during the day.  We discussed patient's current illness and what it means in the larger context of patient's on-going co-morbidities.  She reports she can deal with him having pneumonia and his respiratory issues, but she is having a very hard time understanding what this confusion means for him in the long-term.  I shared that there is likely some hospital delirium layered on top of his baseline of confusion.  While there has been no mention of the word dementia she repeatedly discusses how this level of confusion and disorientation has been present for years.  I attempted to elicit values and goals of care important to the patient.  The patient shares he would just like to go home alone.  And then he laughed.  The wife shares that he has been acting as if he does not like her and does not want to spend time with her.  She shares that she thinks a skilled nursing facility for rehabilitation is the best plan for him.  She knows this is short-term and would like to be able to bring him home after this timeframe ends.  I shared my concerns regarding his mental state and respiratory status.  We reviewed best case, worst case, and likely scenarios.  I outlined given his poor baseline functional and neurological status that we might not be able to have him to previously baseline. I shared that this could be a new baseline for him.  This made her tearful.  I allowed space and therapeutic  silence for her to process these thoughts.   The difference between aggressive medical intervention and comfort care was considered in light of the patient's goals of care.  She shared she understands that this is a possibility.  She says she is on board with moving forward with rehab to see how he does and then making decisions based on his rehab outcomes.   Palliative care services outpatient were explained and offered.  The wife appreciated that she could have this extra layer support when she leaves the hospital.  Discussed with patient/family the importance of continued conversation with family and the medical providers regarding overall plan of care and treatment options, ensuring decisions are within the context of the patient's values and GOCs.    Questions and concerns were addressed. The family was encouraged to call with questions or concerns.   Primary Decision Maker PATIENT  Code Status/Advance Care Planning: DNR Referral placed for palliative outpatient services Treat the treatable  Prognosis:   Unable to determine  Discharge Planning: [facility] for rehab with Palliative care service follow-up  Primary Diagnoses: Present on Admission:  Toxic metabolic encephalopathy  Hyperlipidemia  Hypertension  Acute bronchitis due to infection  Community acquired pneumonia  Acute respiratory failure with hypoxia and hypercapnia ([place])   Physical Exam Vitals and nursing note reviewed.  Constitutional:      Appearance: He is ill-appearing.  HENT:     Head: Normocephalic and atraumatic.     Mouth/Throat:     Mouth: Mucous membranes are dry.  Cardiovascular:     Rate and Rhythm: Normal rate.     Pulses: Normal pulses.  Pulmonary:     Comments: Congested, non productive cough Abdominal:     Palpations: Abdomen is soft.  Musculoskeletal:     Comments: Generalized weakness  Skin:    General: Skin is warm and dry.  Neurological:     Mental Status: He is  alert.  Psychiatric:     Comments: Non-linear thought content, oriented to self but not to place or time    Vital Signs: BP (!) 177/90 (BP Location: Left Arm)   Pulse 81   Temp 97.8 F (36.6 C)   Resp 18   Ht 5' 8"  (1.727 m)   Wt 108.9 kg   SpO2 93%   BMI 36.49 kg/m  Pain Scale: 0-10 POSS *See Group Information*: 1-Acceptable,Awake and alert Pain Score: 0-No pain SpO2: SpO2: 93 % O2 Device:SpO2: 93 % O2 Flow Rate: .O2 Flow Rate (L/min): 2 L/min  Palliative Assessment/Data: 50%  I discussed this patient's plan of care with patient, patient's wife.  Thank you for this consult. Palliative medicine will continue to follow and assist holistically.   Time Total: 70 minutes Greater than 50%  of this time was spent counseling and coordinating care related to the above assessment and plan.  Signed by: Jordan Hawks, DNP, FNP-BC Palliative Medicine    Please contact Palliative Medicine Team phone at [phone] for questions and concerns.  For individual provider: See Shea Evans

## 2020-11-21 NOTE — Progress Notes (Signed)
Pulmonary Medicine            HISTORY OF PRESENT ILLNESS   No new complaints. Appreciate Palliative input. His breathing is easier, has a cough. 02 weaned down. Very weak and his insight is poor.    PAST MEDICAL HISTORY   Past Medical History:  Diagnosis Date   Arthritis    Coronary artery disease    Depression    STARTED AFTER  OPEN HEART SURG.   Diabetes mellitus without complication ([place])    Borderline   Foot pain, bilateral    Gout    Hypertension    Insomnia 10/27/2012   Lumbosacral spinal stenosis    Myocardial infarction ([place])    Neuropathy    FIBRO        SEES  DR. Raliegh Ip  WILLIS   Restless leg syndrome    Stroke [facility])    2008  RIGHT SIDE...HAD FOR A COUPLE OF DAYS AND THEN WENT AWAY   Stroke ([place])    WENT TO [place]  2008     SURGICAL HISTORY   Past Surgical History:  Procedure Laterality Date   BACK SURGERY     CARDIAC CATHETERIZATION     COLONOSCOPY WITH PROPOFOL N/A 11/10/2018   Procedure: COLONOSCOPY WITH PROPOFOL;  Surgeon: Robert Bellow, MD;  Location: [facility] ENDOSCOPY;  Service: Endoscopy;  Laterality: N/A;   CORONARY ARTERY BYPASS GRAFT     (2010 @ [facility]     ?? BLEPHOPLASTY   HAND SURGERY     CRUSHING INJURY---1966  HAS ABOUT 6 SURGERIES THEN   HAND SURGERY Right    HERNIA REPAIR     BILATERAL   LUMBAR FUSION  11/25/2011   POSTERIOR     FAMILY HISTORY   Family History  Problem Relation Age of Onset   Diabetes Mother      SOCIAL HISTORY   Social History   Tobacco Use   Smoking status: Former    Packs/day: 1.00    Years: 30.00    Pack years: 30.00    Types: Cigarettes    Start date: 01/21/1968    Quit date: 01/20/2006    Years since quitting: 14.8   Smokeless tobacco: Former    Quit date: 01/25/2006  Vaping Use   Vaping Use: Never used  Substance Use Topics   Alcohol use: Yes    Alcohol/week: 12.0 standard drinks    Types: 4 Glasses of wine, 8 Shots of liquor per week    Comment: occasional    Drug use: No     MEDICATIONS    Home Medication:    Current Medication:  Current Facility-Administered Medications:    0.9 %  sodium chloride infusion, , Intravenous, PRN, Swayze, Ava, DO, Last Rate: 10 mL/hr at 11/18/20 1818, 10 mL/hr at 11/18/20 1818   acetaminophen (TYLENOL) tablet 650 mg, 650 mg, Oral, Q6H PRN **OR** acetaminophen (TYLENOL) suppository 650 mg, 650 mg, Rectal, Q6H PRN, Swayze, Ava, DO   albuterol (PROVENTIL) (2.5 MG/3ML) 0.083% nebulizer solution 2.5 mg, 2.5 mg, Nebulization, Q6H, Swayze, Ava, DO, 2.5 mg at 11/21/20 1409   apixaban (ELIQUIS) tablet 5 mg, 5 mg, Oral, BID, Swayze, Ava, DO, 5 mg at 11/21/20 0839   azithromycin (ZITHROMAX) 500 mg in sodium chloride 0.9 % 250 mL IVPB, 500 mg, Intravenous, Q24H, Vladimir Crofts, MD, Last Rate: 250 mL/hr at 11/20/20 1750, 500 mg at 11/20/20 1750   bisacodyl (DULCOLAX) EC tablet 5 mg, 5 mg, Oral, Daily,  Max Sane, MD, 5 mg at 11/21/20 0839   cefTRIAXone (ROCEPHIN) 2 g in sodium chloride 0.9 % 100 mL IVPB, 2 g, Intravenous, Q24H, Vladimir Crofts, MD, Last Rate: 200 mL/hr at 11/21/20 1721, 2 g at 11/21/20 1721   cholecalciferol (VITAMIN D3) tablet 1,000 Units, 1,000 Units, Oral, Daily, Swayze, Ava, DO, 1,000 Units at 11/21/20 0839   DULoxetine (CYMBALTA) DR capsule 30 mg, 30 mg, Oral, BID, Swayze, Ava, DO, 30 mg at 11/21/20 0839   fenofibrate tablet 160 mg, 160 mg, Oral, Daily, Swayze, Ava, DO, 160 mg at 78/58/85 0277   folic acid (FOLVITE) tablet 1 mg, 1 mg, Oral, Daily, Swayze, Ava, DO, 1 mg at 11/21/20 0839   guaiFENesin (MUCINEX) 12 hr tablet 1,200 mg, 1,200 mg, Oral, BID, Swayze, Ava, DO, 1,200 mg at 11/21/20 0838   guaiFENesin (ROBITUSSIN) 100 MG/5ML liquid 5 mL, 5 mL, Oral, Q4H PRN, Max Sane, MD, 5 mL at 11/21/20 1404   haloperidol lactate (HALDOL) injection 5 mg, 5 mg, Intravenous, Q6H PRN, Swayze, Ava, DO   influenza vaccine adjuvanted (FLUAD) injection 0.5 mL, 0.5 mL, Intramuscular, Once, Swayze, Ava, DO   irbesartan  (AVAPRO) tablet 150 mg, 150 mg, Oral, Daily, Elodia Florence., MD, 150 mg at 11/21/20 1404   metoprolol tartrate (LOPRESSOR) tablet 25 mg, 25 mg, Oral, BID, Max Sane, MD, 25 mg at 11/21/20 4128   mirtazapine (REMERON) tablet 15 mg, 15 mg, Oral, QHS, Clapacs, John T, MD   multivitamin with minerals tablet 1 tablet, 1 tablet, Oral, Daily, Swayze, Ava, DO, 1 tablet at 11/21/20 0839   ondansetron (ZOFRAN) tablet 4 mg, 4 mg, Oral, Q6H PRN **OR** ondansetron (ZOFRAN) injection 4 mg, 4 mg, Intravenous, Q6H PRN, Swayze, Ava, DO   senna-docusate (Senokot-S) tablet 2 tablet, 2 tablet, Oral, BID, Max Sane, MD, 2 tablet at 11/21/20 0839   simvastatin (ZOCOR) tablet 40 mg, 40 mg, Oral, QHS, Swayze, Ava, DO, 40 mg at 11/20/20 2130    ALLERGIES   Patient has no known allergies.     REVIEW OF SYSTEMS    Review of Systems:  Gen:  Denies  fever, sweats, chills weigh loss  HEENT: Denies blurred vision, double vision, ear pain, eye pain, hearing loss, nose bleeds, sore throat Cardiac:  No dizziness, chest pain or heaviness, chest tightness,edema Resp:   Denies cough or sputum porduction, shortness of breath,wheezing, hemoptysis,  Gi: Denies swallowing difficulty, stomach pain, nausea or vomiting, diarrhea, constipation, bowel incontinence Gu:  Denies bladder incontinence, burning urine Ext:   Denies Joint pain, stiffness or swelling Skin: Denies  skin rash, easy bruising or bleeding or hives Endoc:  Denies polyuria, polydipsia , polyphagia or weight change Psych:   Denies depression, insomnia or hallucinations   Other:  All other systems negative   VS: BP (!) 184/84 (BP Location: Left Arm)   Pulse 97   Temp 97.6 F (36.4 C) (Oral)   Resp 18   Ht 5\' 8"  (1.727 m)   Wt 108.9 kg   SpO2 90%   BMI 36.49 kg/m      PHYSICAL EXAM    GENERAL:NAD, no fevers, chills, no weakness no fatigue HEAD: Normocephalic, atraumatic.  EYES: Pupils equal, round, reactive to light. Extraocular  muscles intact. No scleral icterus.  MOUTH: Moist mucosal membrane. Dentition intact. No abscess noted.  EAR, NOSE, THROAT: Clear without exudates. No external lesions.  NECK: Supple. No thyromegaly. No nodules. No JVD.  PULMONARY: coarse breath sounds CARDIOVASCULAR: S1 and S2. Regular rate and rhythm.  No murmurs, rubs, or gallops. No edema. Pedal pulses 2+ bilaterally.  GASTROINTESTINAL: Soft, nontender, nondistended. No masses. Positive bowel sounds. No hepatosplenomegaly.  MUSCULOSKELETAL: No swelling, clubbing, or edema. Range of motion full in all extremities.  NEUROLOGIC: Cranial nerves II through XII are intact. No gross focal neurological deficits. Sensation intact. Reflexes intact.  SKIN: No ulceration, lesions, rashes, or cyanosis. Skin warm and dry. Turgor intact.  PSYCHIATRIC: Mood, affect within normal limits. The patient is awake, alert and oriented x 3. Insight, judgment intact. More engaing      IMAGING    CT ABDOMEN PELVIS WO CONTRAST  Result Date: 11/17/2020 CLINICAL DATA:  75 year old male with abdominal and pelvic pain. EXAM: CT ABDOMEN AND PELVIS WITHOUT CONTRAST TECHNIQUE: Multidetector CT imaging of the abdomen and pelvis was performed following the standard protocol without IV contrast. COMPARISON:  None. FINDINGS: Please note the parenchymal and vascular abnormalities Stebbins be missed is intravenous contrast was not administered. Lower chest: Focal LEFT basilar consolidation/atelectasis noted. Hepatobiliary: The liver and gallbladder are unremarkable. No biliary dilatation. Pancreas: Unremarkable Spleen: Unremarkable Adrenals/Urinary Tract: The kidneys, adrenal glands and bladder are unremarkable except for a 7 mm nonobstructing mid RIGHT renal calculus. Stomach/Bowel: Stomach is within normal limits. Appendix appears normal. No evidence of bowel wall thickening, distention, or inflammatory changes. Vascular/Lymphatic: Aortic atherosclerosis. No enlarged abdominal or  pelvic lymph nodes. Reproductive: Prostate is unremarkable. Other: No ascites, focal collection or pneumoperitoneum. Musculoskeletal: No acute or suspicious bony abnormalities are noted. Posterior fusion changes at L4-L5 noted. IMPRESSION: 1. No evidence of acute abnormality within the abdomen or pelvis. 2. Focal LEFT basilar consolidation/atelectasis. Pneumonia is not excluded. 3. 7 mm nonobstructing RIGHT renal calculus. 4. Aortic Atherosclerosis (ICD10-I70.0). Electronically Signed   By: Margarette Canada M.D.   On: 11/17/2020 18:09   DG Chest 2 View  Result Date: 11/17/2020 CLINICAL DATA:  Cough and shortness of breath EXAM: CHEST - 2 VIEW COMPARISON:  01/17/2020 and prior studies FINDINGS: Cardiomegaly again noted. Mild LEFT LOWER lobe interstitial opacities are noted. There is no evidence of focal airspace disease, pulmonary edema, suspicious pulmonary nodule/mass, pleural effusion, or pneumothorax. No acute bony abnormalities are identified. IMPRESSION: Mild nonspecific LEFT LOWER lobe interstitial opacities which Nantz represent infection. Electronically Signed   By: Margarette Canada M.D.   On: 11/17/2020 12:17   DG Shoulder Right  Result Date: 11/18/2020 CLINICAL DATA:  Right shoulder pain EXAM: RIGHT SHOULDER - 2+ VIEW COMPARISON:  None. FINDINGS: No fracture or dislocation. Moderate degenerative changes of the acromioclavicular joint. Soft tissues are normal. IMPRESSION: No acute osseous injury of the right shoulder. Electronically Signed   By: Kathreen Devoid M.D.   On: 11/18/2020 11:05   CT HEAD WO CONTRAST (5MM)  Result Date: 11/17/2020 CLINICAL DATA:  Altered mental status.  On anticoagulation. EXAM: CT HEAD WITHOUT CONTRAST TECHNIQUE: Contiguous axial images were obtained from the base of the skull through the vertex without intravenous contrast. COMPARISON:  Head CT 01/17/2020 FINDINGS: Brain: Age related atrophy. No intracranial hemorrhage, mass effect, or midline shift. No hydrocephalus. The  basilar cisterns are patent. Remote lacunar infarcts in the left basal ganglia unchanged. Mild to moderate periventricular chronic small vessel ischemia. No evidence of territorial infarct or acute ischemia. No extra-axial or intracranial fluid collection. Vascular: Atherosclerosis of skullbase vasculature without hyperdense vessel or abnormal calcification. Skull: No fracture or focal lesion. Sinuses/Orbits: Progressive mucosal thickening throughout the paranasal sinus, with subtotal opacification of all paranasal sinuses. Bubbly debris with mucosal thickening in the maxillary sinuses.  No mastoid effusion. No acute orbital findings. Other: None.  Scalp soft tissues are unremarkable. IMPRESSION: 1. No acute intracranial abnormality. 2. Age related atrophy and chronic small vessel ischemia. Remote lacunar infarcts in the left basal ganglia. 3. Progressive paranasal sinus disease. Bubbly debris in the maxillary sinuses. Recommend correlation for clinical signs and symptoms of sinusitis. Electronically Signed   By: Keith Rake M.D.   On: 11/17/2020 18:05   CT L-SPINE NO CHARGE  Result Date: 11/17/2020 CLINICAL DATA:  Fall.  Lumbar fixation. EXAM: CT LUMBAR SPINE WITHOUT CONTRAST TECHNIQUE: Multidetector CT imaging of the lumbar spine was performed without intravenous contrast administration. Multiplanar CT image reconstructions were also generated. COMPARISON:  Lumbar spine radiographs 01/17/2020 FINDINGS: Segmentation: 5 non rib-bearing lumbar type vertebral bodies are present. The lowest fully formed vertebral body is L5. Alignment: No significant listhesis is present. Mild straightening of the normal lumbar lordosis is present. Vertebrae: Vertebral body heights are maintained. Fused anterior osteophytes are present in the lower thoracic spine to the L1 level. Fusion is noted at L4-5. Hardware is intact. Paraspinal and other soft tissues: Atherosclerotic calcifications are present the aorta and branch  vessels without aneurysm. Disc levels: T12-L1: Negative. L1-2: Mild facet hypertrophy is noted bilaterally. No significant disc protrusion or stenosis is present. L2-3: A broad-based disc protrusion is present. Moderate facet hypertrophy is noted bilaterally. No significant stenosis is present. L3-4: A broad-based disc protrusion is present. Advanced facet hypertrophy is noted. Moderate left central and foraminal narrowing is present. L4-5: Solid fusion is present. Laminectomy noted. No residual or recurrent stenosis is present. L5-S1: Mild broad-based disc bulging is present. Advanced facet hypertrophy is noted bilaterally. Mild bilateral foraminal narrowing is present. IMPRESSION: 1. No acute trauma 2. Solid fusion at L4-5 without residual or recurrent stenosis. 3. Adjacent level disease at L3-4 with moderate left central and foraminal narrowing. 4. Mild bilateral foraminal narrowing at L5-S1. 5. DISH 6. Aortic Atherosclerosis (ICD10-I70.0). Electronically Signed   By: San Morelle M.D.   On: 11/17/2020 18:07   DG Chest Port 1 View  Result Date: 11/21/2020 CLINICAL DATA:  Difficulty breathing EXAM: PORTABLE CHEST 1 VIEW COMPARISON:  Previous studies including the examination of 11/18/2020 FINDINGS: Transverse diameter of heart is increased. There are no signs of alveolar pulmonary edema or new focal infiltrates. There is interval decrease in interstitial markings in the parahilar regions and lower lung fields. Linear densities seen in medial left lower lung fields. Costophrenic angles are clear. There is no pneumothorax. IMPRESSION: Cardiomegaly. There is interval decrease in interstitial markings in both lungs suggesting resolving pulmonary edema. Small linear densities in the medial left lower lung fields suggest subsegmental atelectasis. Electronically Signed   By: Elmer Picker M.D.   On: 11/21/2020 11:12   DG Chest Portable 1 View  Result Date: 11/18/2020 CLINICAL DATA:  Difficulty  breathing EXAM: PORTABLE CHEST 1 VIEW COMPARISON:  11/17/2020 FINDINGS: Transverse diameter of heart is increased. Central pulmonary vessels are prominent. There is poor inspiration. Increased interstitial markings are seen in the parahilar regions and lower lung fields, more so on the left side. There is no new focal pulmonary consolidation. Lateral CP angles are clear. There is no pneumothorax. IMPRESSION: Cardiomegaly. Central pulmonary vessels are more prominent which Belk be due to poor inspiration or suggest CHF. Increased interstitial markings are seen in parahilar regions and lower lung fields, more so on the left side suggesting asymmetric pulmonary edema or underlying interstitial pneumonitis. There is no focal pulmonary consolidation or significant pleural effusion.  Electronically Signed   By: Elmer Picker M.D.   On: 11/18/2020 11:06   ECHOCARDIOGRAM COMPLETE  Result Date: 11/19/2020    ECHOCARDIOGRAM REPORT   Patient Name:   Zohair Epp Lococo Jr. Date of Exam: 11/18/2020 Medical Rec #:  [id number]       Height:       68.0 in Accession #:    [id number]      Weight:       240.0 lb Date of Birth:  [birth year]        BSA:          2.208 m Patient Age:    75 years        BP:           143/98 mmHg Patient Gender: M               HR:           95 bpm. Exam Location:  [facility] Procedure: 2D Echo and Intracardiac Opacification Agent Indications:     Systolic CHF  History:         Patient has no prior history of Echocardiogram examinations.                  CAD, Prior CABG; Risk Factors:Hypertension, Dyslipidemia and                  Diabetes.  Sonographer:     L Thornton-Maynard Referring Phys:  [id number] Karie Kirks Diagnosing Phys: Serafina Royals MD  Sonographer Comments: Suboptimal apical window. IMPRESSIONS  1. Left ventricular ejection fraction, by estimation, is 60 to 65%. The left ventricle has normal function. The left ventricle has no regional wall motion abnormalities. Left ventricular diastolic parameters were  normal.  2. Right ventricular systolic function is normal. The right ventricular size is normal. There is mildly elevated pulmonary artery systolic pressure.  3. The mitral valve is normal in structure. Trivial mitral valve regurgitation.  4. The aortic valve is normal in structure. Aortic valve regurgitation is not visualized. FINDINGS  Left Ventricle: Left ventricular ejection fraction, by estimation, is 60 to 65%. The left ventricle has normal function. The left ventricle has no regional wall motion abnormalities. Definity contrast agent was given IV to delineate the left ventricular  endocardial borders. The left ventricular internal cavity size was normal in size. There is no left ventricular hypertrophy. Left ventricular diastolic parameters were normal. Right Ventricle: The right ventricular size is normal. No increase in right ventricular wall thickness. Right ventricular systolic function is normal. There is mildly elevated pulmonary artery systolic pressure. The tricuspid regurgitant velocity is 2.88  m/s, and with an assumed right atrial pressure of 3 mmHg, the estimated right ventricular systolic pressure is 38.7 mmHg. Left Atrium: Left atrial size was normal in size. Right Atrium: Right atrial size was normal in size. Pericardium: There is no evidence of pericardial effusion. Mitral Valve: The mitral valve is normal in structure. Trivial mitral valve regurgitation. Tricuspid Valve: The tricuspid valve is normal in structure. Tricuspid valve regurgitation is trivial. Aortic Valve: The aortic valve is normal in structure. Aortic valve regurgitation is not visualized. Aortic valve mean gradient measures 3.0 mmHg. Aortic valve peak gradient measures 4.9 mmHg. Aortic valve area, by VTI measures 2.26 cm. Pulmonic Valve: The pulmonic valve was normal in structure. Pulmonic valve regurgitation is not visualized. Aorta: The aortic root and ascending aorta are structurally normal, with no evidence of dilitation.  IAS/Shunts: No atrial level shunt detected by color  flow Doppler.  LEFT VENTRICLE PLAX 2D LVIDd:         3.80 cm     Diastology LVIDs:         2.35 cm     LV e' medial:    8.16 cm/s LV PW:         1.40 cm     LV E/e' medial:  10.0 LV IVS:        1.40 cm     LV e' lateral:   8.70 cm/s LVOT diam:     2.20 cm     LV E/e' lateral: 9.4 LV SV:         46 LV SV Index:   21 LVOT Area:     3.80 cm  LV Volumes (MOD) LV vol d, MOD A2C: 87.4 ml LV vol d, MOD A4C: 83.1 ml LV vol s, MOD A2C: 15.3 ml LV vol s, MOD A4C: 21.7 ml LV SV MOD A2C:     72.1 ml LV SV MOD A4C:     83.1 ml LV SV MOD BP:      67.0 ml RIGHT VENTRICLE RV S prime:     8.38 cm/s LEFT ATRIUM             Index LA diam:        4.50 cm 2.04 cm/m LA Vol (A2C):   79.2 ml 35.86 ml/m LA Vol (A4C):   57.0 ml 25.81 ml/m LA Biplane Vol: 67.2 ml 30.43 ml/m  AORTIC VALVE                    PULMONIC VALVE AV Area (Vmax):    2.33 cm     PV Vmax:       0.84 m/s AV Area (Vmean):   2.42 cm     PV Peak grad:  2.8 mmHg AV Area (VTI):     2.26 cm AV Vmax:           111.00 cm/s AV Vmean:          76.400 cm/s AV VTI:            0.202 m AV Peak Grad:      4.9 mmHg AV Mean Grad:      3.0 mmHg LVOT Vmax:         67.90 cm/s LVOT Vmean:        48.700 cm/s LVOT VTI:          0.120 m LVOT/AV VTI ratio: 0.59  AORTA Ao Root diam: 2.80 cm Ao Asc diam:  3.60 cm MITRAL VALVE               TRICUSPID VALVE MV Area (PHT): 3.72 cm    TR Peak grad:   33.2 mmHg MV Decel Time: 204 msec    TR Vmax:        288.00 cm/s MV E velocity: 81.40 cm/s                            SHUNTS                            Systemic VTI:  0.12 m                            Systemic Diam: 2.20 cm Serafina Royals MD Electronically signed by Serafina Royals  MD Signature Date/Time: 11/19/2020/8:52:08 AM    Final     Results for MARIA, GALLICCHIO (MRN [id number]) as of 11/20/2020 16:39   Ref. Range 11/17/2020 15:56  RESP PANEL BY RT-PCR (FLU A&B, COVID) [id number] Unknown Rpt  Influenza A By PCR Latest Ref Range: NEGATIVE   NEGATIVE  Influenza B By PCR Latest Ref Range: NEGATIVE  NEGATIVE  SARS Coronavirus 2 by RT PCR Latest Ref Range: NEGATIVE  NEGATIVE      BLOOD RIGHT HAND     Special Requests BOTTLES DRAWN AEROBIC AND ANAEROBIC Blood Culture results [place] not be optimal due to an inadequate volume of blood received in culture bottles  Culture NO GROWTH 3 DAYS  Performed at [facility], [address], [place], [place] [address]   Report Status PENDING       Results for JOHNTAE, BROXTERMAN (MRN [id number]) as of 11/20/2020 16:39   Ref. Range 11/21/2011 14:19  Staphylococcus aureus Latest Ref Range: NEGATIVE  NEGATIVE    Results for EWALD, BEG (MRN [id number]) as of 11/20/2020 16:39   Ref. Range 11/21/2011 14:19  MRSA, PCR Latest Ref Range: NEGATIVE  NEGATIVE    Fungitell negative  ASSESSMENT/PLAN   Acute hypoxemic respiratory failure, ( pulmoanry edema, ? Pneumonia ( elevated pro calcitonin), copd. improved  mental staus, poor insight. He is quite weak and walks with walker and help. -supplemental O2 during my evaluation 3-4 -PT/OT, considering rehab for d/c planning  -incentive spirometer  -continue present antibiotic regimen -Aspiration precautions   Thank you for allowing me to participate in the care of this patient.   Patient/Family are satisfied with care plan and all questions have been answered.  This document was prepared using Dragon voice recognition software and Seehafer include unintentional dictation errors.     Wallene Huh, M.D.  Division of [facility]

## 2020-11-21 NOTE — Consult Note (Signed)
[facility] Face-to-Face Psychiatry Consult   Reason for Consult: Consult for this 75 year old man with multiple medical problems who has been having some symptoms related to mood and hallucinations Referring Physician: Florene Glen Patient Identification: [name] Cross Dillie Jr. MRN:  [id number] Principal Diagnosis: Depression Diagnosis:  Principal Problem:   Depression Active Problems:   Peripheral neuropathy   Diabetes ([place])   Hyperlipidemia   Toxic metabolic encephalopathy   Coronary artery disease   Hypertension   Acute bronchitis due to infection   Community acquired pneumonia   Acute respiratory failure with hypoxia and hypercapnia (HCC)   Goals of care, counseling/discussion   Total Time spent with patient: 1 hour  Subjective:   [name] Cross Mogensen Brooke Bonito. is a 75 y.o. male patient admitted with "it was mostly my feet".  HPI: Patient seen chart reviewed.  Also spoke with the patient's wife on the telephone.  Consult was placed for visual hallucinations and possible suicidal ideation.  Found patient awake and by himself in his room.  He was engaged in the interview but his attention wandered frequently.  Most of his answers were fairly appropriate but sometimes he would wander off on a tangent or become difficult to understand.  Patient was oriented to being in the hospital.  He was only partially oriented to all of his medical problems and the plan for the future.  Patient says that he has had times recently when he has had suicidal thoughts.  He says that sometimes he will think that it is better off to be dead than to live the way he is living.  I ask him to elaborate on this and he became tangential and it was hard to establish more clarity.  He insists that he had no plan to kill himself and that he has no suicidal thought now.  I asked about visual hallucinations and he readily stated that they happen frequently.  He says that he will see people and animals and things in the room and that it becomes frightening  to him.  I ask him if it would happen both in the nighttime and the daytime and he said it would, but when I inquired if it had happened recently he said that it did not because it does not happen in the daytime.  Patient says overall he feels hopeful and more optimistic but admits he is having an much more fatigue had trouble sleeping not eating well.  Speaking with his wife she describes a pattern over most of the last year in which he will frequently seem confused on and off at home.  He has the visual hallucinations at times at home.  As time has progressed his mood has become more unpredictable.  He has had more spells of despair and also what sounds like more irritability.  He has said some hurtful things to her.  She does acknowledge that he has made suicidal statements but she did not feel that he plan to act on them.  She emphasizes that he has been so debilitated recently that he has not really left the home in months.  He does take Cymbalta 30 mg twice a day at home and she says he has been on that for years.  Past Psychiatric History: No past specific psychiatric care.  Cymbalta 30 mg twice a day for years which apparently was initially meant to target neuropathy and pain.  No history of suicide attempts.  No definite history of substance abuse but it sounds like there have been  times when he drank a fair bit.  Risk to Self:   Risk to Others:   Prior Inpatient Therapy:   Prior Outpatient Therapy:    Past Medical History:  Past Medical History:  Diagnosis Date   Arthritis    Coronary artery disease    Depression    STARTED AFTER  OPEN HEART SURG.   Diabetes mellitus without complication ([place])    Borderline   Foot pain, bilateral    Gout    Hypertension    Insomnia 10/27/2012   Lumbosacral spinal stenosis    Myocardial infarction ([place])    Neuropathy    FIBRO        SEES  DR. Raliegh Ip  WILLIS   Restless leg syndrome    Stroke [facility])    2008  RIGHT SIDE...HAD FOR A COUPLE OF DAYS AND THEN  WENT AWAY   Stroke ([place])    WENT TO [place]  2008    Past Surgical History:  Procedure Laterality Date   BACK SURGERY     CARDIAC CATHETERIZATION     COLONOSCOPY WITH PROPOFOL N/A 11/10/2018   Procedure: COLONOSCOPY WITH PROPOFOL;  Surgeon: Robert Bellow, MD;  Location: [facility] ENDOSCOPY;  Service: Endoscopy;  Laterality: N/A;   CORONARY ARTERY BYPASS GRAFT     (2010 @ [place]     ?? BLEPHOPLASTY   HAND SURGERY     CRUSHING INJURY---1966  HAS ABOUT 6 SURGERIES THEN   HAND SURGERY Right    HERNIA REPAIR     BILATERAL   LUMBAR FUSION  11/25/2011   POSTERIOR   Family History:  Family History  Problem Relation Age of Onset   Diabetes Mother    Family Psychiatric  History: Unknown Social History:  Social History   Substance and Sexual Activity  Alcohol Use Yes   Alcohol/week: 12.0 standard drinks   Types: 4 Glasses of wine, 8 Shots of liquor per week   Comment: occasional     Social History   Substance and Sexual Activity  Drug Use No    Social History   Socioeconomic History   Marital status: Married    Spouse name: Not on file   Number of children: Not on file   Years of education: Not on file   Highest education level: Not on file  Occupational History   Not on file  Tobacco Use   Smoking status: Former    Packs/day: 1.00    Years: 30.00    Pack years: 30.00    Types: Cigarettes    Start date: 01/21/1968    Quit date: 01/20/2006    Years since quitting: 14.8   Smokeless tobacco: Former    Quit date: 01/25/2006  Vaping Use   Vaping Use: Never used  Substance and Sexual Activity   Alcohol use: Yes    Alcohol/week: 12.0 standard drinks    Types: 4 Glasses of wine, 8 Shots of liquor per week    Comment: occasional   Drug use: No   Sexual activity: Not on file  Other Topics Concern   Not on file  Social History Narrative   Not on file   Social Determinants of Health   Financial Resource Strain: Not on file  Food Insecurity: Not on  file  Transportation Needs: Not on file  Physical Activity: Not on file  Stress: Not on file  Social Connections: Not on file   Additional Social History:    Allergies:  No Known Allergies  Labs:  Results for orders placed or performed during the hospital encounter of 11/17/20 (from the past 48 hour(s))  CBC     Status: Abnormal   Collection Time: 11/20/20  6:13 AM  Result Value Ref Range   WBC 4.7 4.0 - 10.5 K/uL   RBC 3.79 (L) 4.22 - 5.81 MIL/uL   Hemoglobin 12.9 (L) 13.0 - 17.0 g/dL   HCT 36.9 (L) 39.0 - 52.0 %   MCV 97.4 80.0 - 100.0 fL   MCH 34.0 26.0 - 34.0 pg   MCHC 35.0 30.0 - 36.0 g/dL   RDW 13.2 11.5 - 15.5 %   Platelets 142 (L) 150 - 400 K/uL   nRBC 0.0 0.0 - 0.2 %    Comment: Performed at [facility], [address] [place], [place] [address]  Basic metabolic panel     Status: Abnormal   Collection Time: 11/20/20  6:13 AM  Result Value Ref Range   Sodium 137 135 - 145 mmol/L   Potassium 3.2 (L) 3.5 - 5.1 mmol/L   Chloride 95 (L) 98 - 111 mmol/L   CO2 35 (H) 22 - 32 mmol/L   Glucose, Bld 148 (H) 70 - 99 mg/dL    Comment: Glucose reference range applies only to samples taken after fasting for at least 8 hours.   BUN 13 8 - 23 mg/dL   Creatinine, Ser 0.86 0.61 - 1.24 mg/dL   Calcium 10.0 8.9 - 10.3 mg/dL   GFR, Estimated >60 >60 mL/min    Comment: (NOTE) Calculated using the CKD-EPI Creatinine Equation (2021)    Anion gap 7 5 - 15    Comment: Performed at [facility], [address] [place], [place] [address]  Magnesium     Status: Abnormal   Collection Time: 11/21/20  6:29 AM  Result Value Ref Range   Magnesium 1.5 (L) 1.7 - 2.4 mg/dL    Comment: Performed at [facility], [address] [place], [place] [address]  Basic metabolic panel     Status: Abnormal   Collection Time: 11/21/20  6:29 AM  Result Value Ref Range   Sodium 139 135 - 145 mmol/L   Potassium 4.5 3.5 - 5.1 mmol/L   Chloride 99 98 - 111 mmol/L   CO2 34  (H) 22 - 32 mmol/L   Glucose, Bld 153 (H) 70 - 99 mg/dL    Comment: Glucose reference range applies only to samples taken after fasting for at least 8 hours.   BUN 14 8 - 23 mg/dL   Creatinine, Ser 0.76 0.61 - 1.24 mg/dL   Calcium 10.9 (H) 8.9 - 10.3 mg/dL   GFR, Estimated >60 >60 mL/min    Comment: (NOTE) Calculated using the CKD-EPI Creatinine Equation (2021)    Anion gap 6 5 - 15    Comment: Performed at [facility], [address] [place], [place] [address]  CBC     Status: Abnormal   Collection Time: 11/21/20  6:29 AM  Result Value Ref Range   WBC 6.4 4.0 - 10.5 K/uL   RBC 3.93 (L) 4.22 - 5.81 MIL/uL   Hemoglobin 13.6 13.0 - 17.0 g/dL   HCT 39.1 39.0 - 52.0 %   MCV 99.5 80.0 - 100.0 fL   MCH 34.6 (H) 26.0 - 34.0 pg   MCHC 34.8 30.0 - 36.0 g/dL   RDW 13.3 11.5 - 15.5 %   Platelets 171 150 - 400 K/uL   nRBC 0.0 0.0 - 0.2 %  Comment: Performed at [facility], [address] [place], [place] [address]  Blood gas, venous     Status: Abnormal   Collection Time: 11/21/20 10:54 AM  Result Value Ref Range   pH, Ven 7.50 (H) 7.250 - 7.430   pCO2, Ven 49 44.0 - 60.0 mmHg   pO2, Ven 98.0 (H) 32.0 - 45.0 mmHg   Bicarbonate 38.2 (H) 20.0 - 28.0 mmol/L   Acid-Base Excess 13.1 (H) 0.0 - 2.0 mmol/L   O2 Saturation 98.2 %   Patient temperature 37.0    Collection site VEIN    Sample type VEIN     Comment: Performed at [facility], [address] [place], [place] [address]  Ammonia     Status: Abnormal   Collection Time: 11/21/20 10:54 AM  Result Value Ref Range   Ammonia 57 (H) 9 - 35 umol/L    Comment: Performed at [facility], [address] [place], [place] [address]  TSH     Status: None   Collection Time: 11/21/20 10:54 AM  Result Value Ref Range   TSH 1.502 0.350 - 4.500 uIU/mL    Comment: Performed by a 3rd Generation assay with a functional sensitivity of <=0.01 uIU/mL. Performed at [facility], [address] [place], [place] [address]   Vitamin B12     Status: Abnormal   Collection Time: 11/21/20 10:54 AM  Result Value Ref Range   Vitamin B-12 1,300 (H) 180 - 914 pg/mL    Comment: (NOTE) This assay is not validated for testing neonatal or myeloproliferative syndrome specimens for Vitamin B12 levels. Performed at [facility], [address] [place], [place] [address]   Folate     Status: None   Collection Time: 11/21/20 10:54 AM  Result Value Ref Range   Folate 66.0 >5.9 ng/mL    Comment: RESULT CONFIRMED BY MANUAL DILUTION MU Performed at [facility], [address] [place],  [address] CORRECTED ON 11/02 AT 1437: PREVIOUSLY REPORTED AS >46.0 RESULT CONFIRMED BY MANUAL DILUTION MU     Current Facility-Administered Medications  Medication Dose Route Frequency Provider Last Rate Last Admin   0.9 %  sodium chloride infusion   Intravenous PRN Swayze, Ava, DO 10 mL/hr at 11/18/20 1818 10 mL/hr at 11/18/20 1818   acetaminophen (TYLENOL) tablet 650 mg  650 mg Oral Q6H PRN Swayze, Ava, DO       Or   acetaminophen (TYLENOL) suppository 650 mg  650 mg Rectal Q6H PRN Swayze, Ava, DO       albuterol (PROVENTIL) (2.5 MG/3ML) 0.083% nebulizer solution 2.5 mg  2.5 mg Nebulization Q6H Swayze, Ava, DO   2.5 mg at 11/21/20 1409   apixaban (ELIQUIS) tablet 5 mg  5 mg Oral BID Swayze, Ava, DO   5 mg at 11/21/20 0839   azithromycin (ZITHROMAX) 500 mg in sodium chloride 0.9 % 250 mL IVPB  500 mg Intravenous Q24H Vladimir Crofts, MD 250 mL/hr at 11/20/20 1750 500 mg at 11/20/20 1750   bisacodyl (DULCOLAX) EC tablet 5 mg  5 mg Oral Daily Max Sane, MD   5 mg at 11/21/20 0839   cefTRIAXone (ROCEPHIN) 2 g in sodium chloride 0.9 % 100 mL IVPB  2 g Intravenous Q24H Vladimir Crofts, MD 200 mL/hr at 11/20/20 1646 2 g at 11/20/20 1646   cholecalciferol (VITAMIN D3) tablet 1,000 Units  1,000 Units Oral Daily Swayze, Ava, DO   1,000 Units at 11/21/20 [phone]  DULoxetine (CYMBALTA) DR capsule 30 mg   30 mg Oral BID Swayze, Ava, DO   30 mg at 11/21/20 0839   fenofibrate tablet 160 mg  160 mg Oral Daily Swayze, Ava, DO   160 mg at 29/52/84 1324   folic acid (FOLVITE) tablet 1 mg  1 mg Oral Daily Swayze, Ava, DO   1 mg at 11/21/20 0839   guaiFENesin (MUCINEX) 12 hr tablet 1,200 mg  1,200 mg Oral BID Swayze, Ava, DO   1,200 mg at 11/21/20 4010   guaiFENesin (ROBITUSSIN) 100 MG/5ML liquid 5 mL  5 mL Oral Q4H PRN Max Sane, MD   5 mL at 11/21/20 1404   haloperidol lactate (HALDOL) injection 5 mg  5 mg Intravenous Q6H PRN Swayze, Ava, DO       influenza vaccine adjuvanted (FLUAD) injection 0.5 mL  0.5 mL Intramuscular Once Swayze, Ava, DO       irbesartan (AVAPRO) tablet 150 mg  150 mg Oral Daily Elodia Florence., MD   150 mg at 11/21/20 1404   metoprolol tartrate (LOPRESSOR) tablet 25 mg  25 mg Oral BID Max Sane, MD   25 mg at 11/21/20 2725   mirtazapine (REMERON) tablet 15 mg  15 mg Oral QHS Solan Vosler, Madie Reno, MD       multivitamin with minerals tablet 1 tablet  1 tablet Oral Daily Swayze, Ava, DO   1 tablet at 11/21/20 0839   ondansetron (ZOFRAN) tablet 4 mg  4 mg Oral Q6H PRN Swayze, Ava, DO       Or   ondansetron (ZOFRAN) injection 4 mg  4 mg Intravenous Q6H PRN Swayze, Ava, DO       senna-docusate (Senokot-S) tablet 2 tablet  2 tablet Oral BID Max Sane, MD   2 tablet at 11/21/20 0839   simvastatin (ZOCOR) tablet 40 mg  40 mg Oral QHS Swayze, Ava, DO   40 mg at 11/20/20 2130    Musculoskeletal: Strength & Muscle Tone: decreased Gait & Station: unsteady Patient leans: N/A            Psychiatric Specialty Exam:  Presentation  General Appearance: No data recorded Eye Contact:No data recorded Speech:No data recorded Speech Volume:No data recorded Handedness:No data recorded  Mood and Affect  Mood:No data recorded Affect:No data recorded  Thought Process  Thought Processes:No data recorded Descriptions of Associations:No data recorded Orientation:No data  recorded Thought Content:No data recorded History of Schizophrenia/Schizoaffective disorder:No data recorded Duration of Psychotic Symptoms:No data recorded Hallucinations:No data recorded Ideas of Reference:No data recorded Suicidal Thoughts:No data recorded Homicidal Thoughts:No data recorded  Sensorium  Memory:No data recorded Judgment:No data recorded Insight:No data recorded  Executive Functions  Concentration:No data recorded Attention Span:No data recorded Recall:No data recorded Fund of Knowledge:No data recorded Language:No data recorded  Psychomotor Activity  Psychomotor Activity:No data recorded  Assets  Assets:No data recorded  Sleep  Sleep:No data recorded  Physical Exam: Physical Exam Vitals and nursing note reviewed.  Constitutional:      Appearance: Normal appearance. He is ill-appearing.  HENT:     Head: Normocephalic and atraumatic.     Mouth/Throat:     Pharynx: Oropharynx is clear.  Eyes:     Pupils: Pupils are equal, round, and reactive to light.  Cardiovascular:     Rate and Rhythm: Normal rate and regular rhythm.  Pulmonary:     Effort: Pulmonary effort is normal.     Breath sounds: Normal breath sounds.  Abdominal:  General: Abdomen is flat.     Palpations: Abdomen is soft.  Musculoskeletal:        General: Normal range of motion.  Skin:    General: Skin is warm and dry.  Neurological:     General: No focal deficit present.     Mental Status: He is alert. Mental status is at baseline.  Psychiatric:        Attention and Perception: He is inattentive.        Mood and Affect: Mood normal. Affect is blunt.        Speech: Speech is tangential.        Thought Content: Thought content normal. Thought content does not include homicidal or suicidal ideation.        Cognition and Memory: Cognition is impaired. Memory is impaired.   Review of Systems  Constitutional:  Positive for malaise/fatigue.  HENT: Negative.    Eyes: Negative.    Respiratory: Negative.    Cardiovascular: Negative.   Gastrointestinal: Negative.   Musculoskeletal: Negative.   Skin: Negative.   Neurological: Negative.   Psychiatric/Behavioral:  Positive for hallucinations and suicidal ideas. Negative for depression and substance abuse. The patient is nervous/anxious and has insomnia.   Blood pressure (!) 184/84, pulse 97, temperature 97.6 F (36.4 C), temperature source Oral, resp. rate 18, height 5\' 8"  (1.727 m), weight 108.9 kg, SpO2 90 %. Body mass index is 36.49 kg/m.  Treatment Plan Summary: Medication management and Plan 75 year old man with multiple medical problems.  From a mental health standpoint probably multiple things going on.  Probably has been developing some dementia gradually at home worsening and more noticeable over the last year.  The visual hallucinations are very typical of people starting to develop cognitive problems.  Also however it sounds like there have been significant mood symptoms possibly indicative of depression.  He is on a antidepressant but has been on it for years and it Bewick not be effective for his mood symptoms.  I suggested to his wife that something that might help symptomatically as well as possibly treating some underlying depression would be adding another antidepressant specifically mirtazapine 15 mg at night to help also with sleep and appetite.  Wife was agreeable to this plan.  I have gone ahead and put the order in for that.  I would expect it to be well tolerated with his other medicines and I do not think we need to change the Cymbalta.  If agitation is happening and getting dangerous at night or upsetting to him low-dose antipsychotics could be added but I am not going to do that at this point in order to avoid causing any problem side effects unnecessarily.  Disposition: Patient does not meet criteria for psychiatric inpatient admission. Supportive therapy provided about ongoing stressors.  Alethia Berthold,  MD 11/21/2020 4:38 PM

## 2020-11-22 DIAGNOSIS — F331 Major depressive disorder, recurrent, moderate: Secondary | ICD-10-CM | POA: Diagnosis not present

## 2020-11-22 LAB — CULTURE, BLOOD (SINGLE)
Culture: NO GROWTH
Culture: NO GROWTH
Special Requests: ADEQUATE

## 2020-11-22 LAB — AMMONIA: Ammonia: 21 umol/L (ref 9–35)

## 2020-11-22 LAB — ALBUMIN
Albumin: 3 g/dL — ABNORMAL LOW (ref 3.5–5.0)
Albumin: 3.5 g/dL (ref 3.5–5.0)

## 2020-11-22 LAB — BASIC METABOLIC PANEL
Anion gap: 11 (ref 5–15)
BUN: 20 mg/dL (ref 8–23)
CO2: 34 mmol/L — ABNORMAL HIGH (ref 22–32)
Calcium: 12 mg/dL — ABNORMAL HIGH (ref 8.9–10.3)
Chloride: 94 mmol/L — ABNORMAL LOW (ref 98–111)
Creatinine, Ser: 1.22 mg/dL (ref 0.61–1.24)
GFR, Estimated: >60 mL/min (ref >60)
Glucose, Bld: 144 mg/dL — ABNORMAL HIGH (ref 70–99)
Potassium: 4 mmol/L (ref 3.5–5.1)
Sodium: 139 mmol/L (ref 135–145)

## 2020-11-22 LAB — CBC WITH DIFFERENTIAL/PLATELET
Abs Immature Granulocytes: 0.13 10*3/uL — ABNORMAL HIGH (ref 0.00–0.07)
Basophils Absolute: 0 10*3/uL (ref 0.0–0.1)
Basophils Relative: 1 %
Eosinophils Absolute: 0.1 10*3/uL (ref 0.0–0.5)
Eosinophils Relative: 2 %
HCT: 42.2 % (ref 39.0–52.0)
Hemoglobin: 13.9 g/dL (ref 13.0–17.0)
Immature Granulocytes: 2 %
Lymphocytes Relative: 31 %
Lymphs Abs: 1.7 10*3/uL (ref 0.7–4.0)
MCH: 32.9 pg (ref 26.0–34.0)
MCHC: 32.9 g/dL (ref 30.0–36.0)
MCV: 99.8 fL (ref 80.0–100.0)
Monocytes Absolute: 0.6 10*3/uL (ref 0.1–1.0)
Monocytes Relative: 12 %
Neutro Abs: 2.8 10*3/uL (ref 1.7–7.7)
Neutrophils Relative %: 52 %
Platelets: 165 10*3/uL (ref 150–400)
RBC: 4.23 MIL/uL (ref 4.22–5.81)
RDW: 13.3 % (ref 11.5–15.5)
WBC: 5.4 10*3/uL (ref 4.0–10.5)
nRBC: 0 % (ref 0.0–0.2)

## 2020-11-22 LAB — COMPREHENSIVE METABOLIC PANEL
ALT: 21 U/L (ref 0–44)
AST: 24 U/L (ref 15–41)
Albumin: 3.3 g/dL — ABNORMAL LOW (ref 3.5–5.0)
Alkaline Phosphatase: 30 U/L — ABNORMAL LOW (ref 38–126)
Anion gap: 7 (ref 5–15)
BUN: 17 mg/dL (ref 8–23)
CO2: 39 mmol/L — ABNORMAL HIGH (ref 22–32)
Calcium: 11.4 mg/dL — ABNORMAL HIGH (ref 8.9–10.3)
Chloride: 94 mmol/L — ABNORMAL LOW (ref 98–111)
Creatinine, Ser: 1.14 mg/dL (ref 0.61–1.24)
GFR, Estimated: >60 mL/min (ref >60)
Glucose, Bld: 144 mg/dL — ABNORMAL HIGH (ref 70–99)
Potassium: 4.3 mmol/L (ref 3.5–5.1)
Sodium: 140 mmol/L (ref 135–145)
Total Bilirubin: 0.6 mg/dL (ref 0.3–1.2)
Total Protein: 7 g/dL (ref 6.5–8.1)

## 2020-11-22 LAB — URINE CULTURE: Culture: 20000 — AB

## 2020-11-22 LAB — PHOSPHORUS: Phosphorus: 4.2 mg/dL (ref 2.5–4.6)

## 2020-11-22 LAB — CALCIUM: Calcium: 11.1 mg/dL — ABNORMAL HIGH (ref 8.9–10.3)

## 2020-11-22 LAB — MAGNESIUM: Magnesium: 1.6 mg/dL — ABNORMAL LOW (ref 1.7–2.4)

## 2020-11-22 LAB — VITAMIN D 25 HYDROXY (VIT D DEFICIENCY, FRACTURES): Vit D, 25-Hydroxy: 33.15 ng/mL (ref 30–100)

## 2020-11-22 MED ORDER — MAGNESIUM SULFATE 2 GM/50ML IV SOLN
2.0000 g | Freq: Once | INTRAVENOUS | Status: AC
  Administered 2020-11-22: 2 g via INTRAVENOUS
  Filled 2020-11-22: qty 50

## 2020-11-22 MED ORDER — SODIUM CHLORIDE 0.9 % IV SOLN
INTRAVENOUS | Status: AC
Start: 2020-11-22 — End: 2020-11-24

## 2020-11-22 NOTE — Care Management Important Message (Signed)
Important Message  Patient Details  Name: [name] Eriksson Mears Jr. MRN: [id number] Date of Birth: May 11, 1945   Medicare Important Message Given:  Yes     Juliann Pulse A Emika Tiano 11/22/2020, 12:00 PM

## 2020-11-22 NOTE — Progress Notes (Signed)
Occupational Therapy Treatment Patient Details Name: [name] Prosperi Mcphatter Jr. MRN: [id number] DOB: August 10, 1945 Today's Date: 11/22/2020   History of present illness Pt is 75 yr old man with a known history of CAD, PAF, DM II, Peripheral neuropathy, hypertension, and history of stroke who according to his wife has intermittent episodes of confusion and disorientation at baseline. Pt presented to [facility] with increased short of breath with a progressively worsening cough for 4 days. He was admitted for acute hypoxic respiratory failure and acute metabolic encephalopathy   OT comments  Pt seen for OT Tx this date to f/u re: safety with ADLs/ADL mobility. Pt agreeable. OT engage pt in sup to sit transition with MOD A. Pt demos F to P static sitting balance. OT engages pt in seated g/h tasks including CGA for face washing and MIN A for UB bathing. He is relatively awake and attentive during first portion of session (although still confused), but is noted to be significantly more drowsy/groggy during second portion when OT checks on pt after dinner time to assist back to bed. Pt requires MOD A with RW to STS and to take ~5-6 small shuffling steps from bed to chair. He requires MAX A to stand and take small steps back to bed, seemingly more fatigued after having sat up ~45 minutes, but also noted to have taken nasal cannula and O2 monitor off (OT replaced and monitored while cueing for PLB until sats >90% and notified RN). Pt returned to bed with all needs met and in reach at end of session. Will continue to follow acutely. Continue to anticipate that pt will require STR OT f/u upon d/c from hospital d/t significant weakness.    Recommendations for follow up therapy are one component of a multi-disciplinary discharge planning process, led by the attending physician.  Recommendations [place] be updated based on patient status, additional functional criteria and insurance authorization.    Follow Up Recommendations  Skilled  nursing-short term rehab (<3 hours/day)    Assistance Recommended at Discharge Frequent or constant Supervision/Assistance  Equipment Recommendations  Other (comment) (defer)    Recommendations for Other Services      Precautions / Restrictions Precautions Precautions: Fall Restrictions Weight Bearing Restrictions: No       Mobility Bed Mobility Overal bed mobility: Needs Assistance Bed Mobility: Supine to Sit     Supine to sit: Mod assist;HOB elevated          Transfers Overall transfer level: Needs assistance Equipment used: Rolling walker (2 wheels) Transfers: Sit to/from Stand Sit to Stand: Mod assist           General transfer comment: MOD A, tactile cues for hand/foot placement, block foot from sliding     Balance Overall balance assessment: Needs assistance Sitting-balance support: Bilateral upper extremity supported;Feet supported Sitting balance-Leahy Scale: Fair Sitting balance - Comments: intermittent bouts of sustaining G static sitting w/o UE support, but primarily needs UE support and even intermittent MIN A when fatiguing   Standing balance support: Bilateral upper extremity supported;During functional activity Standing balance-Leahy Scale: Poor Standing balance comment: requires BUE support                           ADL either performed or assessed with clinical judgement   ADL Overall ADL's : Needs assistance/impaired     Grooming: Wash/dry face;Set up;Min guard;Sitting Grooming Details (indicate cue type and reason): CGA for sitting balance Upper Body Bathing: Minimal assistance;Sitting Upper Body  Bathing Details (indicate cue type and reason): CGA to MIN A for sitting balance and MIN A for actual functional task         Lower Body Dressing: Maximal assistance;Sitting/lateral leans Lower Body Dressing Details (indicate cue type and reason): limited dynamic sitting balance as well as limited trunk ROM, reports h/o back  procedures. MAX A to don socks             Functional mobility during ADLs: Moderate assistance;Rolling walker (2 wheels) (to take ~5-6 shuffling steps from EOB to recliner adjacent with cues for safety including tactile cues to reach back before sitting in recliner.)       Vision Patient Visual Report: No change from baseline     Perception     Praxis      Cognition Arousal/Alertness: Awake/alert Behavior During Therapy: WFL for tasks assessed/performed Overall Cognitive Status: History of cognitive impairments - at baseline                                 General Comments: Pt is oriented to self, month and year. Some aspects of situation, but not location. Follows all simple one step commands appropriately. Somewhat slow processing/response time.          Exercises Other Exercises Other Exercises: OT engages pt in seated bathing/grooming tasks as well as transfer to chair with RW with MOD A. Pt requires MAX A to transfer back to bed which OT re-presents to assist nursing with after pt has eaten dinner.   Shoulder Instructions       General Comments pt checked on after dinner and had removed both his spO2 sensor as well as his nasal cannula. OT checked and pt sats at 84%. nasal cannula replaced and pt coached through PLB and within ~1-2 minutes he increases to >90%. RN-Tiffanie notified that pt removed sensor and needs replacement.    Pertinent Vitals/ Pain       Pain Assessment: Faces Faces Pain Scale: No hurt  Home Living                                          Prior Functioning/Environment              Frequency  Min 1X/week        Progress Toward Goals  OT Goals(current goals can now be found in the care plan section)  Progress towards OT goals: Progressing toward goals  Acute Rehab OT Goals Patient Stated Goal: to return home with wife OT Goal Formulation: With patient Time For Goal Achievement:  12/03/20 Potential to Achieve Goals: Fair  Plan      Co-evaluation                 AM-PAC OT "6 Clicks" Daily Activity     Outcome Measure   Help from another person eating meals?: None Help from another person taking care of personal grooming?: A Little Help from another person toileting, which includes using toliet, bedpan, or urinal?: A Lot Help from another person bathing (including washing, rinsing, drying)?: A Lot Help from another person to put on and taking off regular upper body clothing?: A Little Help from another person to put on and taking off regular lower body clothing?: A Lot 6 Click Score: 16    End of Session Equipment  Utilized During Treatment: Gait belt;Rolling walker (2 wheels);Oxygen  OT Visit Diagnosis: Unsteadiness on feet (R26.81);Muscle weakness (generalized) (M62.81)   Activity Tolerance Patient tolerated treatment well   Patient Left in chair;with call bell/phone within reach;with chair alarm set (later returned to bed with bed alarm set and pt's spouse present)   Nurse Communication Mobility status;Other (comment) (notified pt has been removing lines/leads including taking O2 monitor off and taking his nasal cannula off.)        Time: 2694-8546 OT Time Calculation (min): 48 min  Charges: OT General Charges $OT Visit: 1 Visit OT Treatments $Self Care/Home Management : 8-22 mins $Therapeutic Activity: 23-37 mins  Gerrianne Scale, MS, OTR/L ascom [phone] 11/22/20, 5:52 PM

## 2020-11-22 NOTE — Progress Notes (Signed)
PROGRESS NOTE    [name] Heesch Coolman Jr.  ZOX:[id number] DOB: 1945-03-05 DOA: 11/17/2020 PCP: Albina Billet, MD (Confirm with patient/family/NH records and if not entered, this HAS to be entered at [facility] point of entry. "No PCP" if truly none.)   Chief Complaint  Patient presents with   Altered Mental Status    Brief Narrative:  75 yr old man with Zamira Hickam known history of CAD, PAF, DM II, Peripheral neuropathy, Hypertension, and history of stroke who according to his wife has intermittent episodes of confusion and disorientation at baseline. He has been increasingly short of breath with Wells Gerdeman progressively worsening cough for 4 days.  He is admitted for acute hypoxic respiratory failure and acute metabolic encephalopathy   10/30 -possible aspiration event.  Oxygen requirement went up to 4 L.  Giving 40 of Lasix based on the chest x-ray showing pulmonary vascular congestion. 10/31: Weaned off to 2.5 L oxygen.  Pulmonary seen transferred to any MedSurg giving additional 40 mg of Lasix today PT, OT consult 11/1: PT,OT recommends SNF, palliative care consult  Assessment & Plan:   Principal Problem:   Depression Active Problems:   Peripheral neuropathy   Diabetes ([place])   Hyperlipidemia   Toxic metabolic encephalopathy   Coronary artery disease   Hypertension   Acute bronchitis due to infection   Community acquired pneumonia   Acute respiratory failure with hypoxia and hypercapnia (HCC)   Goals of care, counseling/discussion  Acute respiratory failure with hypoxia and hypercapnia (HCC) Currently on 2 L [place] CXR today with interval decrease in interstitial markings in both lungs, suggesting resolving pulm edema - small linear densities in medial L lower lung fields suggest subsegmental atelectassi Being treated for pneumonia (abx completed) and heart failure with lasix (currently on hold) Wean O2 as tolerated  Community acquired pneumonia Ceftriaxone/azithro 10/29 - 11/2 Pulmonary consult - recommened  RVP (not collected), fungitell (negative), legionella ab (pending), urine strep negative, histoplasma urine ag, sputum cx - pending collection, afb expectorated specimen (discussed with pulm who did not recommend isolation) Consider influenza vaccine prior to d/c   HFpEF exacerbation Edema improving Continue lasix as tolerated (on hold for now)  Hypercalcemia Not overtly symptomatic at this time - gradually worsening over past few days Give gentle IVF and follow Caution with volume status in setting of above Follow PTH Stop vitamin D  Toxic metabolic encephalopathy Likely related to resp failure and infection Ammonia elevated - will repeat, Kamp need lactulose B12 elevated, folate wnl, TSH wnl VBG without hypercarbia  Cognitive Deficit  Hallucinations Concerning for dementia Needs outpatient neurology  Essential Tremor Needs neuro follow outpt  Suicidal Ideation Psych c/s, appreciate assistance - started mirtazepine, doesn't meet criteria for inpatient psych admission  Deconditioning PT/OT recommending SNF  Goals of care, counseling/discussion Palliative care consult.  Patient is DNR.  Appreciate palliative care.   Hypertension Metoprolol irbesartan   Coronary artery disease Continue statin and Eliquis (will need to review why eliquis?)   metoprolol    Hyperlipidemia Continue fenofibrate   Diabetes (HCC) Hemoglobin A1c of 6.  Blood sugars seem to be fairly well controlled   Peripheral neuropathy On Cymbalta  DVT prophylaxis: eliquis Code Status:DNR Family Communication: wife at bedside Disposition:   Status is: Inpatient  Remains inpatient appropriate because: continued acute hypoxic resp failure       Consultants:  pulmonology  Procedures: Echo IMPRESSIONS     1. Left ventricular ejection fraction, by estimation, is 60 to 65%. The  left ventricle has  normal function. The left ventricle has no regional  wall motion abnormalities. Left  ventricular diastolic parameters were  normal.   2. Right ventricular systolic function is normal. The right ventricular  size is normal. There is mildly elevated pulmonary artery systolic  pressure.   3. The mitral valve is normal in structure. Trivial mitral valve  regurgitation.   4. The aortic valve is normal in structure. Aortic valve regurgitation is  not visualized.   Antimicrobials:  Anti-infectives (From admission, onward)    Start     Dose/Rate Route Frequency Ordered Stop   11/17/20 1630  cefTRIAXone (ROCEPHIN) 2 g in sodium chloride 0.9 % 100 mL IVPB        2 g 200 mL/hr over 30 Minutes Intravenous Every 24 hours 11/17/20 1624 11/21/20 1751   11/17/20 1630  azithromycin (ZITHROMAX) 500 mg in sodium chloride 0.9 % 250 mL IVPB        500 mg 250 mL/hr over 60 Minutes Intravenous Every 24 hours 11/17/20 1624 11/21/20 1942          Subjective: Sleepy this morning  Objective: Vitals:   11/21/20 2034 11/22/20 0542 11/22/20 1150 11/22/20 1534  BP: (!) 153/88 (!) 146/88 (!) 171/88 137/80  Pulse: 100 85 (!) 107 100  Resp: 16 18 20 20   Temp: 98.9 F (37.2 C) 97.9 F (36.6 C) 98.2 F (36.8 C) 97.7 F (36.5 C)  TempSrc:   Oral Oral  SpO2: 96% 98% 96% 97%  Weight:      Height:        Intake/Output Summary (Last 24 hours) at 11/22/2020 1820 Last data filed at 11/22/2020 1540 Gross per 24 hour  Intake 240 ml  Output 2600 ml  Net -2360 ml   Filed Weights   11/17/20 1119  Weight: 108.9 kg    Examination:  General: No acute distress. Cardiovascular: Heart sounds show [name] [name] regular rate, and rhythm.  Lungs: Clear to auscultation bilaterally  Abdomen: Soft, nontender, nondistended Neurological: drowsy, Takiya Belmares&OX3 with time, generally appropriate, moving all extremities Skin: Warm and dry. No rashes or lesions. Extremities: No clubbing or cyanosis. No edema.   Data Reviewed: I have personally reviewed following labs and imaging studies  CBC: Recent Labs  Lab  11/18/20 0624 11/19/20 0459 11/20/20 0613 11/21/20 0629 11/22/20 0436  WBC 6.5 5.1 4.7 6.4 5.4  NEUTROABS  --   --   --   --  2.8  HGB 12.4* 13.1 12.9* 13.6 13.9  HCT 35.6* 37.0* 36.9* 39.1 42.2  MCV 97.3 99.2 97.4 99.5 99.8  PLT 162 145* 142* 171 008    Basic Metabolic Panel: Recent Labs  Lab 11/19/20 0459 11/20/20 0613 11/21/20 0629 11/22/20 0436 11/22/20 0850 11/22/20 1646  NA 137 137 139 140  --  139  K 2.9* 3.2* 4.5 4.3  --  4.0  CL 94* 95* 99 94*  --  94*  CO2 34* 35* 34* 39*  --  34*  GLUCOSE 140* 148* 153* 144*  --  144*  BUN 13 13 14 17   --  20  CREATININE 0.72 0.86 0.76 1.14  --  1.22  CALCIUM 9.4 10.0 10.9* 11.4* 11.1* 12.0*  MG 1.2*  --  1.5* 1.6*  --   --   PHOS  --   --   --  4.2  --   --     GFR: Estimated Creatinine Clearance: 64.5 mL/min (by C-G formula based on SCr of 1.22 mg/dL).  Liver Function Tests: Recent  Labs  Lab 11/17/20 1126 11/18/20 0624 11/22/20 0436 11/22/20 0850 11/22/20 1646  AST 24 20 24   --   --   ALT 21 19 21   --   --   ALKPHOS 32* 33* 30*  --   --   BILITOT 0.9 0.8 0.6  --   --   PROT 6.4* 6.0* 7.0  --   --   ALBUMIN 2.9* 2.8* 3.3* 3.0* 3.5    CBG: No results for input(s): GLUCAP in the last 168 hours.   Recent Results (from the past 240 hour(s))  Urine Culture     Status: Abnormal   Collection Time: 11/17/20 12:31 PM   Specimen: In/Out Cath Urine  Result Value Ref Range Status   Specimen Description   Final    IN/OUT CATH URINE Performed at [facility], [address] [place], [place] [address]    Special Requests   Final    NONE Performed at [facility], [address] [place], [place] [address]    Culture (Monesha Monreal)  Final    20,000 COLONIES/mL AEROCOCCUS SPECIES Standardized susceptibility testing for this organism is not available. Performed at [facility], [place] [address], [place], [place] [address]    Report Status 11/22/2020 FINAL  Final  Blood culture (routine single)      Status: None   Collection Time: 11/17/20  3:42 PM   Specimen: BLOOD  Result Value Ref Range Status   Specimen Description BLOOD RIGHT ANTECUBITAL  Final   Special Requests   Final    BOTTLES DRAWN AEROBIC AND ANAEROBIC Blood Culture adequate volume   Culture   Final    NO GROWTH 5 DAYS Performed at [facility], [address] [place], [place] [address]    Report Status 11/22/2020 FINAL  Final  Resp Panel by RT-PCR (Flu Samone Guhl&B, Covid) Nasopharyngeal Swab     Status: None   Collection Time: 11/17/20  3:56 PM   Specimen: Nasopharyngeal Swab; Nasopharyngeal(NP) swabs in vial transport medium  Result Value Ref Range Status   SARS Coronavirus 2 by RT PCR NEGATIVE NEGATIVE Final    Comment: (NOTE) SARS-CoV-2 target nucleic acids are NOT DETECTED.  The SARS-CoV-2 RNA is generally detectable in upper respiratory specimens during the acute phase of infection. The lowest concentration of SARS-CoV-2 viral copies this assay can detect is 138 copies/mL. Mavric Cortright negative result does not preclude SARS-Cov-2 infection and should not be used as the sole basis for treatment or other patient management decisions. Deone Omahoney negative result [place] occur with  improper specimen collection/handling, submission of specimen other than nasopharyngeal swab, presence of viral mutation(s) within the areas targeted by this assay, and inadequate number of viral copies(<138 copies/mL). Kyan Giannone negative result must be combined with clinical observations, patient history, and epidemiological information. The expected result is Negative.  Fact Sheet for Patients:  EntrepreneurPulse.com.au  Fact Sheet for Healthcare Providers:  IncredibleEmployment.be  This test is no t yet approved or cleared by the [place] FDA and  has been authorized for detection and/or diagnosis of SARS-CoV-2 by FDA under an Emergency Use Authorization (EUA). This EUA will remain  in effect (meaning this test can  be used) for the duration of the COVID-19 declaration under Section 564(b)(1) of the Act, 21 U.S.C.section 360bbb-3(b)(1), unless the authorization is terminated  or revoked sooner.       Influenza Chinwe Lope by PCR NEGATIVE NEGATIVE Final   Influenza B by PCR NEGATIVE NEGATIVE Final    Comment: (NOTE)  The Xpert Xpress SARS-CoV-2/FLU/RSV plus assay is intended as an aid in the diagnosis of influenza from Nasopharyngeal swab specimens and should not be used as Golden Gilreath sole basis for treatment. Nasal washings and aspirates are unacceptable for Xpert Xpress SARS-CoV-2/FLU/RSV testing.  Fact Sheet for Patients: EntrepreneurPulse.com.au  Fact Sheet for Healthcare Providers: IncredibleEmployment.be  This test is not yet approved or cleared by the [place] FDA and has been authorized for detection and/or diagnosis of SARS-CoV-2 by FDA under an Emergency Use Authorization (EUA). This EUA will remain in effect (meaning this test can be used) for the duration of the COVID-19 declaration under Section 564(b)(1) of the Act, 21 U.S.C. section 360bbb-3(b)(1), unless the authorization is terminated or revoked.  Performed at [facility], [address] [place], [place] [address]   Blood culture (single)     Status: None   Collection Time: 11/17/20  4:43 PM   Specimen: BLOOD  Result Value Ref Range Status   Specimen Description BLOOD RIGHT HAND  Final   Special Requests   Final    BOTTLES DRAWN AEROBIC AND ANAEROBIC Blood Culture results Bollen not be optimal due to an inadequate volume of blood received in culture bottles   Culture   Final    NO GROWTH 5 DAYS Performed at [facility], [address] [place], [place] [address]    Report Status 11/22/2020 FINAL  Final         Radiology Studies: DG Chest Port 1 View  Result Date: 11/21/2020 CLINICAL DATA:  Difficulty breathing EXAM: PORTABLE CHEST 1 VIEW COMPARISON:  Previous studies  including the examination of 11/18/2020 FINDINGS: Transverse diameter of heart is increased. There are no signs of alveolar pulmonary edema or new focal infiltrates. There is interval decrease in interstitial markings in the parahilar regions and lower lung fields. Linear densities seen in medial left lower lung fields. Costophrenic angles are clear. There is no pneumothorax. IMPRESSION: Cardiomegaly. There is interval decrease in interstitial markings in both lungs suggesting resolving pulmonary edema. Small linear densities in the medial left lower lung fields suggest subsegmental atelectasis. Electronically Signed   By: Elmer Picker M.D.   On: 11/21/2020 11:12        Scheduled Meds:  albuterol  2.5 mg Nebulization Q6H   apixaban  5 mg Oral BID   bisacodyl  5 mg Oral Daily   DULoxetine  30 mg Oral BID   fenofibrate  160 mg Oral Daily   folic acid  1 mg Oral Daily   influenza vaccine adjuvanted  0.5 mL Intramuscular Once   irbesartan  150 mg Oral Daily   lactulose  20 g Oral Daily   metoprolol tartrate  25 mg Oral BID   mirtazapine  15 mg Oral QHS   multivitamin with minerals  1 tablet Oral Daily   senna-docusate  2 tablet Oral BID   simvastatin  40 mg Oral QHS   Continuous Infusions:  sodium chloride 10 mL/hr (11/18/20 1818)     LOS: 5 days    Time spent: over 30 min    Fayrene Helper, MD [facility]   To contact the attending provider between 7A-7P or the covering provider during after hours 7P-7A, please log into the web site [email or web address] and access using universal [place] password for that web site. If you do not have the password, please call the hospital operator.  11/22/2020, 6:20 PM

## 2020-11-22 NOTE — Progress Notes (Signed)
Physical Therapy Treatment Patient Details Name: [name] Wyss Deming Jr. MRN: [id number] DOB: 10-May-1945 Today's Date: 11/22/2020   History of Present Illness Pt is 75 yr old man with a known history of CAD, PAF, DM II, Peripheral neuropathy, hypertension, and history of stroke who according to his wife has intermittent episodes of confusion and disorientation at baseline. Pt presented to [facility] with increased short of breath with a progressively worsening cough for 4 days. He was admitted for acute hypoxic respiratory failure and acute metabolic encephalopathy    PT Comments    Pt asleep, requires consistent multimodal cues to awaken but does attempt to participate in therapy once seated EOB. SpO2 > 94% at rest on 2 L [place]. Pt required MOD A for bed mobility and notes lightheadedness upon sitting. Orthostatics assessed but pt unable to maintain standing positioning for full assessment (BP: 120/97 seated). STS x 2 with MOD-A from elevated bed height (MAX-A from lowest position). Pt denied further OOB mobility due to general malaise and fatigue. O2 assessed throughout treatment and pt required an increase from 2-3L [place] when standing to maintain SpO2 > 88%, returned to 2L end of session w/ SpO2 > 94% (RN notified.) SNF remains primary discharge recommendation at this time. Skilled PT intervention is indicated to address deficits in function, mobility, and to return to PLOF as able.     Recommendations for follow up therapy are one component of a multi-disciplinary discharge planning process, led by the attending physician.  Recommendations [place] be updated based on patient status, additional functional criteria and insurance authorization.  Follow Up Recommendations  Skilled nursing-short term rehab (<3 hours/day)     Assistance Recommended at Discharge Frequent or constant Supervision/Assistance  Equipment Recommendations  None recommended by PT    Recommendations for Other Services       Precautions /  Restrictions Precautions Precautions: Fall Restrictions Weight Bearing Restrictions: No     Mobility  Bed Mobility Overal bed mobility: Needs Assistance Bed Mobility: Supine to Sit;Sit to Supine     Supine to sit: Mod assist;HOB elevated Sit to supine: Mod assist   General bed mobility comments: MOD A for trunk, pt able to mobilize BLE; MOD A for returning to bed 2/2 to malaise, pt able to bend knees and assist with scooting to [facility] w/ + 2    Transfers Overall transfer level: Needs assistance Equipment used: Rolling walker (2 wheels) Transfers: Sit to/from Stand Sit to Stand: From elevated surface;Max assist;Mod assist           General transfer comment: MAX A for lowered bed height, unable to clear bottom; MOD-A from elevated bed height & pt uanble to maintain static standing position long enough to assess BP.    Ambulation/Gait             General Gait Details: Deferred due to pt reporting dizziness, malaise, and requests returning to bed   Stairs             Wheelchair Mobility    Modified Rankin (Stroke Patients Only)       Balance Overall balance assessment: Needs assistance Sitting-balance support: Bilateral upper extremity supported;Feet supported Sitting balance-Leahy Scale: Fair Sitting balance - Comments: Pt able to maintain balance while leaning on legs and short bouts of lifting arms Postural control: Left lateral lean   Standing balance-Leahy Scale: Poor Standing balance comment: requires BUE support  Cognition Arousal/Alertness: Lethargic Behavior During Therapy: WFL for tasks assessed/performed Overall Cognitive Status: History of cognitive impairments - at baseline                                 General Comments: Oriented to person, situation, month; disoriented to location; able to follow all one-step commands        Exercises      General Comments General comments  (skin integrity, edema, etc.): 2L O2 w/ SpO2 94% at rest, destated to low 80s with standing & O2 bumped to 3L SpO2 > 94%, returned to 2L at rest w/ SpO2 94% in bed      Pertinent Vitals/Pain Pain Assessment: Faces Faces Pain Scale: Hurts a little bit Pain Location: Bilat feet w/ pressure Pain Descriptors / Indicators: Discomfort;Sore Pain Intervention(s): Monitored during session;Limited activity within patient's tolerance    Home Living                          Prior Function            PT Goals (current goals can now be found in the care plan section) Progress towards PT goals: Progressing toward goals    Frequency    Min 2X/week      PT Plan Current plan remains appropriate    Co-evaluation              AM-PAC PT "6 Clicks" Mobility   Outcome Measure  Help needed turning from your back to your side while in a flat bed without using bedrails?: A Little Help needed moving from lying on your back to sitting on the side of a flat bed without using bedrails?: A Lot Help needed moving to and from a bed to a chair (including a wheelchair)?: A Lot Help needed standing up from a chair using your arms (e.g., wheelchair or bedside chair)?: A Lot Help needed to walk in hospital room?: A Lot Help needed climbing 3-5 steps with a railing? : A Lot 6 Click Score: 13    End of Session Equipment Utilized During Treatment: Gait belt;Oxygen Activity Tolerance: Patient limited by fatigue Patient left: in bed;with call bell/phone within reach;with bed alarm set Nurse Communication: Mobility status PT Visit Diagnosis: Unsteadiness on feet (R26.81);Muscle weakness (generalized) (M62.81);Difficulty in walking, not elsewhere classified (R26.2)     Time: 4656-8127 PT Time Calculation (min) (ACUTE ONLY): 28 min  Charges:                        [employer], SPT

## 2020-11-22 NOTE — TOC Progression Note (Signed)
Transition of Care (TOC) - Progression Note    Patient Details  Name: [name] Hubble Paro Jr. MRN: [id number] Date of Birth: [birth year]  Transition of Care [facility]) CM/SW Contact  Shelbie Hutching, RN Phone Number: 11/22/2020, 1:17 PM  Clinical Narrative:    Patient has one bed offer from [facility]- offer presented to patient and wife at bedside.  Wife would like to discuss with patient and maybe go visit facility this afternoon.  She will call RNCM this afternoon with answer.     Expected Discharge Plan: [place] Barriers to Discharge: Continued Medical Work up  Expected Discharge Plan and Services Expected Discharge Plan: [place] In-house Referral: Clinical Social Work   Post Acute Care Choice: [place] Living arrangements for the past 2 months: Single Family Home                                       Social Determinants of Health (SDOH) Interventions    Readmission Risk Interventions No flowsheet data found.

## 2020-11-22 NOTE — Progress Notes (Signed)
Pulmonary Medicine          Date: 11/22/2020,   MRN# [id number] [name] Jaroszewski Neu Jr. 23-Jul-1945      HISTORY OF PRESENT ILLNESS   Wife is in the room. Patient more drowsy today. No worsening in his breathing, cough per wife is some better.    Cxr is clearing up  PAST MEDICAL HISTORY   Past Medical History:  Diagnosis Date   Arthritis    Coronary artery disease    Depression    STARTED AFTER  OPEN HEART SURG.   Diabetes mellitus without complication ([place])    Borderline   Foot pain, bilateral    Gout    Hypertension    Insomnia 10/27/2012   Lumbosacral spinal stenosis    Myocardial infarction ([place])    Neuropathy    FIBRO        SEES  DR. Raliegh Ip  WILLIS   Restless leg syndrome    Stroke [facility])    2008  RIGHT SIDE...HAD FOR A COUPLE OF DAYS AND THEN WENT AWAY   Stroke ([place])    WENT TO [place]  2008     SURGICAL HISTORY   Past Surgical History:  Procedure Laterality Date   BACK SURGERY     CARDIAC CATHETERIZATION     COLONOSCOPY WITH PROPOFOL N/A 11/10/2018   Procedure: COLONOSCOPY WITH PROPOFOL;  Surgeon: Robert Bellow, MD;  Location: [facility] ENDOSCOPY;  Service: Endoscopy;  Laterality: N/A;   CORONARY ARTERY BYPASS GRAFT     (2010 @ [place]     ?? BLEPHOPLASTY   HAND SURGERY     CRUSHING INJURY---1966  HAS ABOUT 6 SURGERIES THEN   HAND SURGERY Right    HERNIA REPAIR     BILATERAL   LUMBAR FUSION  11/25/2011   POSTERIOR     FAMILY HISTORY   Family History  Problem Relation Age of Onset   Diabetes Mother      SOCIAL HISTORY   Social History   Tobacco Use   Smoking status: Former    Packs/day: 1.00    Years: 30.00    Pack years: 30.00    Types: Cigarettes    Start date: 01/21/1968    Quit date: 01/20/2006    Years since quitting: 14.8   Smokeless tobacco: Former    Quit date: 01/25/2006  Vaping Use   Vaping Use: Never used  Substance Use Topics   Alcohol use: Yes    Alcohol/week: 12.0 standard drinks    Types: 4 Glasses of  wine, 8 Shots of liquor per week    Comment: occasional   Drug use: No     MEDICATIONS    Home Medication:    Current Medication:  Current Facility-Administered Medications:    0.9 %  sodium chloride infusion, , Intravenous, PRN, Max Sane, MD, Last Rate: 10 mL/hr at 11/18/20 1818, 10 mL/hr at 11/18/20 1818   acetaminophen (TYLENOL) tablet 650 mg, 650 mg, Oral, Q6H PRN **OR** acetaminophen (TYLENOL) suppository 650 mg, 650 mg, Rectal, Q6H PRN, Swayze, Ava, DO   albuterol (PROVENTIL) (2.5 MG/3ML) 0.083% nebulizer solution 2.5 mg, 2.5 mg, Nebulization, Q6H, Swayze, Ava, DO, 2.5 mg at 11/22/20 1332   apixaban (ELIQUIS) tablet 5 mg, 5 mg, Oral, BID, Swayze, Ava, DO, 5 mg at 11/22/20 1003   bisacodyl (DULCOLAX) EC tablet 5 mg, 5 mg, Oral, Daily, Manuella Ghazi, Vipul, MD, 5 mg at 11/22/20 1004   DULoxetine (CYMBALTA) DR capsule 30 mg, 30  mg, Oral, BID, Swayze, Ava, DO, 30 mg at 11/22/20 1003   fenofibrate tablet 160 mg, 160 mg, Oral, Daily, Swayze, Ava, DO, 160 mg at 93/81/01 7510   folic acid (FOLVITE) tablet 1 mg, 1 mg, Oral, Daily, Swayze, Ava, DO, 1 mg at 11/22/20 1004   guaiFENesin (ROBITUSSIN) 100 MG/5ML liquid 5 mL, 5 mL, Oral, Q4H PRN, Max Sane, MD, 5 mL at 11/21/20 1852   haloperidol lactate (HALDOL) injection 5 mg, 5 mg, Intravenous, Q6H PRN, Swayze, Ava, DO   influenza vaccine adjuvanted (FLUAD) injection 0.5 mL, 0.5 mL, Intramuscular, Once, Manuella Ghazi, Vipul, MD   irbesartan (AVAPRO) tablet 150 mg, 150 mg, Oral, Daily, Elodia Florence., MD, 150 mg at 11/22/20 1003   lactulose (CHRONULAC) 10 GM/15ML solution 20 g, 20 g, Oral, Daily, Elodia Florence., MD, 20 g at 11/22/20 1004   metoprolol tartrate (LOPRESSOR) tablet 25 mg, 25 mg, Oral, BID, Max Sane, MD, 25 mg at 11/22/20 1004   mirtazapine (REMERON) tablet 15 mg, 15 mg, Oral, QHS, Clapacs, John T, MD, 15 mg at 11/21/20 2120   multivitamin with minerals tablet 1 tablet, 1 tablet, Oral, Daily, Swayze, Ava, DO, 1 tablet at  11/22/20 1003   ondansetron (ZOFRAN) tablet 4 mg, 4 mg, Oral, Q6H PRN **OR** ondansetron (ZOFRAN) injection 4 mg, 4 mg, Intravenous, Q6H PRN, Swayze, Ava, DO   senna-docusate (Senokot-S) tablet 2 tablet, 2 tablet, Oral, BID, Manuella Ghazi, Vipul, MD, 2 tablet at 11/22/20 1005   simvastatin (ZOCOR) tablet 40 mg, 40 mg, Oral, QHS, Swayze, Ava, DO, 40 mg at 11/21/20 2121    ALLERGIES   Patient has no known allergies.     REVIEW OF SYSTEMS    Review of Systems:  Gen:  Denies  fever, sweats, chills weigh loss  HEENT: Denies blurred vision, double vision, ear pain, eye pain, hearing loss, nose bleeds, sore throat Cardiac:  No dizziness, chest pain or heaviness, chest tightness,edema Resp:   ++ cough or sputum porduction, shortness of breath,wheezing, hemoptysis,  Gi: Denies swallowing difficulty, stomach pain, nausea or vomiting, diarrhea, constipation, bowel incontinence Gu:  Denies bladder incontinence, burning urine Ext:   Denies Joint pain, stiffness or swelling Skin: Denies  skin rash, easy bruising or bleeding or hives Endoc:  Denies polyuria, polydipsia , polyphagia or weight change Psych:   Denies depression, insomnia or hallucinations   Other:  All other systems negative   VS: BP 137/80 (BP Location: Left Arm)   Pulse 100   Temp 97.7 F (36.5 C) (Oral)   Resp 20   Ht 5\' 8"  (1.727 m)   Wt 108.9 kg   SpO2 97%   BMI 36.49 kg/m      PHYSICAL EXAM    GENERAL:NAD, no fevers, chills, no weakness no fatigue HEAD: Normocephalic, atraumatic.  EYES: Pupils equal, round, reactive to light. Extraocular muscles intact. No scleral icterus.  MOUTH: Moist mucosal membrane. Dentition intact. No abscess noted.  EAR, NOSE, THROAT: Clear without exudates. No external lesions.  NECK: Supple. No thyromegaly. No nodules. No JVD.  PULMONARY: Diffuse coarse rhonchi right sided +wheezes CARDIOVASCULAR: S1 and S2. Regular rate and rhythm. No murmurs, rubs, or gallops. No edema. Pedal pulses 2+  bilaterally.  GASTROINTESTINAL: Soft, nontender, nondistended. No masses. Positive bowel sounds. No hepatosplenomegaly.  MUSCULOSKELETAL: No swelling, clubbing, or edema. Range of motion full in all extremities.  NEUROLOGIC: Cranial nerves II through XII are intact. No gross focal neurological deficits. Sensation intact. Reflexes intact.  SKIN: No ulceration, lesions, rashes,  or cyanosis. Skin warm and dry. Turgor intact.  PSYCHIATRIC: Mood, affect within normal limits. The patient is awake, alert and oriented x 3. Insight, judgment intact.       IMAGING    CT ABDOMEN PELVIS WO CONTRAST  Result Date: 11/17/2020 CLINICAL DATA:  76 year old male with abdominal and pelvic pain. EXAM: CT ABDOMEN AND PELVIS WITHOUT CONTRAST TECHNIQUE: Multidetector CT imaging of the abdomen and pelvis was performed following the standard protocol without IV contrast. COMPARISON:  None. FINDINGS: Please note the parenchymal and vascular abnormalities Safley be missed is intravenous contrast was not administered. Lower chest: Focal LEFT basilar consolidation/atelectasis noted. Hepatobiliary: The liver and gallbladder are unremarkable. No biliary dilatation. Pancreas: Unremarkable Spleen: Unremarkable Adrenals/Urinary Tract: The kidneys, adrenal glands and bladder are unremarkable except for a 7 mm nonobstructing mid RIGHT renal calculus. Stomach/Bowel: Stomach is within normal limits. Appendix appears normal. No evidence of bowel wall thickening, distention, or inflammatory changes. Vascular/Lymphatic: Aortic atherosclerosis. No enlarged abdominal or pelvic lymph nodes. Reproductive: Prostate is unremarkable. Other: No ascites, focal collection or pneumoperitoneum. Musculoskeletal: No acute or suspicious bony abnormalities are noted. Posterior fusion changes at L4-L5 noted. IMPRESSION: 1. No evidence of acute abnormality within the abdomen or pelvis. 2. Focal LEFT basilar consolidation/atelectasis. Pneumonia is not excluded.  3. 7 mm nonobstructing RIGHT renal calculus. 4. Aortic Atherosclerosis (ICD10-I70.0). Electronically Signed   By: Margarette Canada M.D.   On: 11/17/2020 18:09   DG Chest 2 View  Result Date: 11/17/2020 CLINICAL DATA:  Cough and shortness of breath EXAM: CHEST - 2 VIEW COMPARISON:  01/17/2020 and prior studies FINDINGS: Cardiomegaly again noted. Mild LEFT LOWER lobe interstitial opacities are noted. There is no evidence of focal airspace disease, pulmonary edema, suspicious pulmonary nodule/mass, pleural effusion, or pneumothorax. No acute bony abnormalities are identified. IMPRESSION: Mild nonspecific LEFT LOWER lobe interstitial opacities which Bansal represent infection. Electronically Signed   By: Margarette Canada M.D.   On: 11/17/2020 12:17   DG Shoulder Right  Result Date: 11/18/2020 CLINICAL DATA:  Right shoulder pain EXAM: RIGHT SHOULDER - 2+ VIEW COMPARISON:  None. FINDINGS: No fracture or dislocation. Moderate degenerative changes of the acromioclavicular joint. Soft tissues are normal. IMPRESSION: No acute osseous injury of the right shoulder. Electronically Signed   By: Kathreen Devoid M.D.   On: 11/18/2020 11:05   CT HEAD WO CONTRAST (5MM)  Result Date: 11/17/2020 CLINICAL DATA:  Altered mental status.  On anticoagulation. EXAM: CT HEAD WITHOUT CONTRAST TECHNIQUE: Contiguous axial images were obtained from the base of the skull through the vertex without intravenous contrast. COMPARISON:  Head CT 01/17/2020 FINDINGS: Brain: Age related atrophy. No intracranial hemorrhage, mass effect, or midline shift. No hydrocephalus. The basilar cisterns are patent. Remote lacunar infarcts in the left basal ganglia unchanged. Mild to moderate periventricular chronic small vessel ischemia. No evidence of territorial infarct or acute ischemia. No extra-axial or intracranial fluid collection. Vascular: Atherosclerosis of skullbase vasculature without hyperdense vessel or abnormal calcification. Skull: No fracture or focal  lesion. Sinuses/Orbits: Progressive mucosal thickening throughout the paranasal sinus, with subtotal opacification of all paranasal sinuses. Bubbly debris with mucosal thickening in the maxillary sinuses. No mastoid effusion. No acute orbital findings. Other: None.  Scalp soft tissues are unremarkable. IMPRESSION: 1. No acute intracranial abnormality. 2. Age related atrophy and chronic small vessel ischemia. Remote lacunar infarcts in the left basal ganglia. 3. Progressive paranasal sinus disease. Bubbly debris in the maxillary sinuses. Recommend correlation for clinical signs and symptoms of sinusitis. Electronically Signed  By: Keith Rake M.D.   On: 11/17/2020 18:05   CT L-SPINE NO CHARGE  Result Date: 11/17/2020 CLINICAL DATA:  Fall.  Lumbar fixation. EXAM: CT LUMBAR SPINE WITHOUT CONTRAST TECHNIQUE: Multidetector CT imaging of the lumbar spine was performed without intravenous contrast administration. Multiplanar CT image reconstructions were also generated. COMPARISON:  Lumbar spine radiographs 01/17/2020 FINDINGS: Segmentation: 5 non rib-bearing lumbar type vertebral bodies are present. The lowest fully formed vertebral body is L5. Alignment: No significant listhesis is present. Mild straightening of the normal lumbar lordosis is present. Vertebrae: Vertebral body heights are maintained. Fused anterior osteophytes are present in the lower thoracic spine to the L1 level. Fusion is noted at L4-5. Hardware is intact. Paraspinal and other soft tissues: Atherosclerotic calcifications are present the aorta and branch vessels without aneurysm. Disc levels: T12-L1: Negative. L1-2: Mild facet hypertrophy is noted bilaterally. No significant disc protrusion or stenosis is present. L2-3: A broad-based disc protrusion is present. Moderate facet hypertrophy is noted bilaterally. No significant stenosis is present. L3-4: A broad-based disc protrusion is present. Advanced facet hypertrophy is noted. Moderate left  central and foraminal narrowing is present. L4-5: Solid fusion is present. Laminectomy noted. No residual or recurrent stenosis is present. L5-S1: Mild broad-based disc bulging is present. Advanced facet hypertrophy is noted bilaterally. Mild bilateral foraminal narrowing is present. IMPRESSION: 1. No acute trauma 2. Solid fusion at L4-5 without residual or recurrent stenosis. 3. Adjacent level disease at L3-4 with moderate left central and foraminal narrowing. 4. Mild bilateral foraminal narrowing at L5-S1. 5. DISH 6. Aortic Atherosclerosis (ICD10-I70.0). Electronically Signed   By: San Morelle M.D.   On: 11/17/2020 18:07   DG Chest Port 1 View  Result Date: 11/21/2020 CLINICAL DATA:  Difficulty breathing EXAM: PORTABLE CHEST 1 VIEW COMPARISON:  Previous studies including the examination of 11/18/2020 FINDINGS: Transverse diameter of heart is increased. There are no signs of alveolar pulmonary edema or new focal infiltrates. There is interval decrease in interstitial markings in the parahilar regions and lower lung fields. Linear densities seen in medial left lower lung fields. Costophrenic angles are clear. There is no pneumothorax. IMPRESSION: Cardiomegaly. There is interval decrease in interstitial markings in both lungs suggesting resolving pulmonary edema. Small linear densities in the medial left lower lung fields suggest subsegmental atelectasis. Electronically Signed   By: Elmer Picker M.D.   On: 11/21/2020 11:12   DG Chest Portable 1 View  Result Date: 11/18/2020 CLINICAL DATA:  Difficulty breathing EXAM: PORTABLE CHEST 1 VIEW COMPARISON:  11/17/2020 FINDINGS: Transverse diameter of heart is increased. Central pulmonary vessels are prominent. There is poor inspiration. Increased interstitial markings are seen in the parahilar regions and lower lung fields, more so on the left side. There is no new focal pulmonary consolidation. Lateral CP angles are clear. There is no pneumothorax.  IMPRESSION: Cardiomegaly. Central pulmonary vessels are more prominent which Sheller be due to poor inspiration or suggest CHF. Increased interstitial markings are seen in parahilar regions and lower lung fields, more so on the left side suggesting asymmetric pulmonary edema or underlying interstitial pneumonitis. There is no focal pulmonary consolidation or significant pleural effusion. Electronically Signed   By: Elmer Picker M.D.   On: 11/18/2020 11:06   ECHOCARDIOGRAM COMPLETE  Result Date: 11/19/2020    ECHOCARDIOGRAM REPORT   Patient Name:   Medford Staheli Vold Jr. Date of Exam: 11/18/2020 Medical Rec #:  [id number]       Height:       68.0 in Accession #:  [id number]      Weight:       240.0 lb Date of Birth:  Dec 04, 1945        BSA:          2.208 m Patient Age:    33 years        BP:           143/98 mmHg Patient Gender: M               HR:           95 bpm. Exam Location:  [facility] Procedure: 2D Echo and Intracardiac Opacification Agent Indications:     Systolic CHF  History:         Patient has no prior history of Echocardiogram examinations.                  CAD, Prior CABG; Risk Factors:Hypertension, Dyslipidemia and                  Diabetes.  Sonographer:     L Thornton-Maynard Referring Phys:  [id number] Karie Kirks Diagnosing Phys: Serafina Royals MD  Sonographer Comments: Suboptimal apical window. IMPRESSIONS  1. Left ventricular ejection fraction, by estimation, is 60 to 65%. The left ventricle has normal function. The left ventricle has no regional wall motion abnormalities. Left ventricular diastolic parameters were normal.  2. Right ventricular systolic function is normal. The right ventricular size is normal. There is mildly elevated pulmonary artery systolic pressure.  3. The mitral valve is normal in structure. Trivial mitral valve regurgitation.  4. The aortic valve is normal in structure. Aortic valve regurgitation is not visualized. FINDINGS  Left Ventricle: Left ventricular ejection fraction, by  estimation, is 60 to 65%. The left ventricle has normal function. The left ventricle has no regional wall motion abnormalities. Definity contrast agent was given IV to delineate the left ventricular  endocardial borders. The left ventricular internal cavity size was normal in size. There is no left ventricular hypertrophy. Left ventricular diastolic parameters were normal. Right Ventricle: The right ventricular size is normal. No increase in right ventricular wall thickness. Right ventricular systolic function is normal. There is mildly elevated pulmonary artery systolic pressure. The tricuspid regurgitant velocity is 2.88  m/s, and with an assumed right atrial pressure of 3 mmHg, the estimated right ventricular systolic pressure is 29.9 mmHg. Left Atrium: Left atrial size was normal in size. Right Atrium: Right atrial size was normal in size. Pericardium: There is no evidence of pericardial effusion. Mitral Valve: The mitral valve is normal in structure. Trivial mitral valve regurgitation. Tricuspid Valve: The tricuspid valve is normal in structure. Tricuspid valve regurgitation is trivial. Aortic Valve: The aortic valve is normal in structure. Aortic valve regurgitation is not visualized. Aortic valve mean gradient measures 3.0 mmHg. Aortic valve peak gradient measures 4.9 mmHg. Aortic valve area, by VTI measures 2.26 cm. Pulmonic Valve: The pulmonic valve was normal in structure. Pulmonic valve regurgitation is not visualized. Aorta: The aortic root and ascending aorta are structurally normal, with no evidence of dilitation. IAS/Shunts: No atrial level shunt detected by color flow Doppler.  LEFT VENTRICLE PLAX 2D LVIDd:         3.80 cm     Diastology LVIDs:         2.35 cm     LV e' medial:    8.16 cm/s LV PW:         1.40 cm     LV E/e' medial:  10.0  LV IVS:        1.40 cm     LV e' lateral:   8.70 cm/s LVOT diam:     2.20 cm     LV E/e' lateral: 9.4 LV SV:         46 LV SV Index:   21 LVOT Area:     3.80 cm   LV Volumes (MOD) LV vol d, MOD A2C: 87.4 ml LV vol d, MOD A4C: 83.1 ml LV vol s, MOD A2C: 15.3 ml LV vol s, MOD A4C: 21.7 ml LV SV MOD A2C:     72.1 ml LV SV MOD A4C:     83.1 ml LV SV MOD BP:      67.0 ml RIGHT VENTRICLE RV S prime:     8.38 cm/s LEFT ATRIUM             Index LA diam:        4.50 cm 2.04 cm/m LA Vol (A2C):   79.2 ml 35.86 ml/m LA Vol (A4C):   57.0 ml 25.81 ml/m LA Biplane Vol: 67.2 ml 30.43 ml/m  AORTIC VALVE                    PULMONIC VALVE AV Area (Vmax):    2.33 cm     PV Vmax:       0.84 m/s AV Area (Vmean):   2.42 cm     PV Peak grad:  2.8 mmHg AV Area (VTI):     2.26 cm AV Vmax:           111.00 cm/s AV Vmean:          76.400 cm/s AV VTI:            0.202 m AV Peak Grad:      4.9 mmHg AV Mean Grad:      3.0 mmHg LVOT Vmax:         67.90 cm/s LVOT Vmean:        48.700 cm/s LVOT VTI:          0.120 m LVOT/AV VTI ratio: 0.59  AORTA Ao Root diam: 2.80 cm Ao Asc diam:  3.60 cm MITRAL VALVE               TRICUSPID VALVE MV Area (PHT): 3.72 cm    TR Peak grad:   33.2 mmHg MV Decel Time: 204 msec    TR Vmax:        288.00 cm/s MV E velocity: 81.40 cm/s                            SHUNTS                            Systemic VTI:  0.12 m                            Systemic Diam: 2.20 cm Serafina Royals MD Electronically signed by Serafina Royals MD Signature Date/Time: 11/19/2020/8:52:08 AM    Final       ASSESSMENT/PLAN   Acute hypoxemic respiratory failure, ( pulmoanry edema, ? Pneumonia ( elevated pro calcitonin), copd. improved  mental staus, poor insight. Psych has seen. He is quite weak and walks with walker and help. His cough is slowly decreasing -PT/OT -considering rehab soon  -incentive spirometer  -continue present  care -Aspiration precautions     Thank you for allowing me to participate in the care of this patient.   Patient/Family are satisfied with care plan and all questions have been answered.  This document was prepared using Dragon voice recognition software and  [place]ranie include unintentional dictation errors.     Wallene Huh, M.D.  Division of [place]

## 2020-11-22 NOTE — Consult Note (Signed)
[name] Consult   Reason for Consult: Follow-up consult 75 year old man with a history of depression currently in the hospital with multiple medical problems Referring Physician: Tama Headings Patient Identification: [name] MRN:  [id number] Principal Diagnosis: Depression Diagnosis:  Principal Problem:   Depression Active Problems:   Peripheral neuropathy   Diabetes ([place])   Hyperlipidemia   Toxic metabolic encephalopathy   Coronary artery disease   Hypertension   Acute bronchitis due to infection   Community acquired pneumonia   Acute respiratory failure with hypoxia and hypercapnia (HCC)   Goals of care, counseling/discussion   Total Time spent with patient: 30 minutes  Subjective:   [name] [name]. is a 75 y.o. male patient admitted with patient currently sedated and not able to give much history.[name] [name]  HPI: Patient seen chart reviewed.  Patient's wife was present and was able to give some history.  We reviewed what she had told me yesterday about his worsening symptoms consistent with depression over the last several months.  Today patient is a little more drowsy.  She reports that today he had some episodes of still seeing things earlier in the day.  Has not made any suicidal statements today.  He has been eating.  Past Psychiatric History: Patient has a past history of depression but without much in the way of aggressive treatment mostly chronic pain and multiple medical problems no history of suicide attempts or hospitalization  Risk to Self:   Risk to Others:   Prior Inpatient Therapy:   Prior Outpatient Therapy:    Past Medical History:  Past Medical History:  Diagnosis Date   Arthritis    Coronary artery disease    Depression    STARTED AFTER  OPEN HEART SURG.   Diabetes mellitus without complication ([name])    Borderline   Foot pain, bilateral    Gout    Hypertension    Insomnia 10/27/2012   Lumbosacral spinal stenosis    Myocardial infarction  ([name])    Neuropathy    FIBRO        SEES  DR. Raliegh Ip  WILLIS   Restless leg syndrome    Stroke [facility])    2008  RIGHT SIDE...HAD FOR A COUPLE OF DAYS AND THEN WENT AWAY   Stroke ([name])    WENT TO [place]  2008    Past Surgical History:  Procedure Laterality Date   BACK SURGERY     CARDIAC CATHETERIZATION     COLONOSCOPY WITH PROPOFOL N/A 11/10/2018   Procedure: COLONOSCOPY WITH PROPOFOL;  Surgeon: Robert Bellow, MD;  Location: [facility] ENDOSCOPY;  Service: Endoscopy;  Laterality: N/A;   CORONARY ARTERY BYPASS GRAFT     (2010 @ [place]     ?? BLEPHOPLASTY   HAND SURGERY     CRUSHING INJURY---1966  HAS ABOUT 6 SURGERIES THEN   HAND SURGERY Right    HERNIA REPAIR     BILATERAL   LUMBAR FUSION  11/25/2011   POSTERIOR   Family History:  Family History  Problem Relation Age of Onset   Diabetes Mother    Family Psychiatric  History: See previous Social History:  Social History   Substance and Sexual Activity  Alcohol Use Yes   Alcohol/week: 12.0 standard drinks   Types: 4 Glasses of wine, 8 Shots of liquor per week   Comment: occasional     Social History   Substance and Sexual Activity  Drug Use No  Social History   Socioeconomic History   Marital status: Married    Spouse name: Not on file   Number of children: Not on file   Years of education: Not on file   Highest education level: Not on file  Occupational History   Not on file  Tobacco Use   Smoking status: Former    Packs/day: 1.00    Years: 30.00    Pack years: 30.00    Types: Cigarettes    Start date: 01/21/1968    Quit date: 01/20/2006    Years since quitting: 14.8   Smokeless tobacco: Former    Quit date: 01/25/2006  Vaping Use   Vaping Use: Never used  Substance and Sexual Activity   Alcohol use: Yes    Alcohol/week: 12.0 standard drinks    Types: 4 Glasses of wine, 8 Shots of liquor per week    Comment: occasional   Drug use: No   Sexual activity: Not on file  Other Topics Concern    Not on file  Social History Narrative   Not on file   Social Determinants of Health   Financial Resource Strain: Not on file  Food Insecurity: Not on file  Transportation Needs: Not on file  Physical Activity: Not on file  Stress: Not on file  Social Connections: Not on file   Additional Social History:    Allergies:  No Known Allergies  Labs:  Results for orders placed or performed during the hospital encounter of 11/17/20 (from the past 48 hour(s))  Magnesium     Status: Abnormal   Collection Time: 11/21/20  6:29 AM  Result Value Ref Range   Magnesium 1.5 (L) 1.7 - 2.4 mg/dL    Comment: Performed at [facility], [address] [place], [place] [address]  Basic metabolic panel     Status: Abnormal   Collection Time: 11/21/20  6:29 AM  Result Value Ref Range   Sodium 139 135 - 145 mmol/L   Potassium 4.5 3.5 - 5.1 mmol/L   Chloride 99 98 - 111 mmol/L   CO2 34 (H) 22 - 32 mmol/L   Glucose, Bld 153 (H) 70 - 99 mg/dL    Comment: Glucose reference range applies only to samples taken after fasting for at least 8 hours.   BUN 14 8 - 23 mg/dL   Creatinine, Ser 0.76 0.61 - 1.24 mg/dL   Calcium 10.9 (H) 8.9 - 10.3 mg/dL   GFR, Estimated >60 >60 mL/min    Comment: (NOTE) Calculated using the CKD-EPI Creatinine Equation (2021)    Anion gap 6 5 - 15    Comment: Performed at [facility], [address] [place], [place] [address]  CBC     Status: Abnormal   Collection Time: 11/21/20  6:29 AM  Result Value Ref Range   WBC 6.4 4.0 - 10.5 K/uL   RBC 3.93 (L) 4.22 - 5.81 MIL/uL   Hemoglobin 13.6 13.0 - 17.0 g/dL   HCT 39.1 39.0 - 52.0 %   MCV 99.5 80.0 - 100.0 fL   MCH 34.6 (H) 26.0 - 34.0 pg   MCHC 34.8 30.0 - 36.0 g/dL   RDW 13.3 11.5 - 15.5 %   Platelets 171 150 - 400 K/uL   nRBC 0.0 0.0 - 0.2 %    Comment: Performed at [facility], [address], [place], [place] [address]  Blood gas, venous     Status: Abnormal   Collection Time:  11/21/20 10:54 AM  Result Value Ref Range   pH, Ven 7.50 (H) 7.250 - 7.430   pCO2, Ven 49 44.0 - 60.0 mmHg   pO2, Ven 98.0 (H) 32.0 - 45.0 mmHg   Bicarbonate 38.2 (H) 20.0 - 28.0 mmol/L   Acid-Base Excess 13.1 (H) 0.0 - 2.0 mmol/L   O2 Saturation 98.2 %   Patient temperature 37.0    Collection site VEIN    Sample type VEIN     Comment: Performed at [facility], [address] [place], [place] [address]  Ammonia     Status: Abnormal   Collection Time: 11/21/20 10:54 AM  Result Value Ref Range   Ammonia 57 (H) 9 - 35 umol/L    Comment: Performed at [facility], [address] [place], [place] [address]  TSH     Status: None   Collection Time: 11/21/20 10:54 AM  Result Value Ref Range   TSH 1.502 0.350 - 4.500 uIU/mL    Comment: Performed by a 3rd Generation assay with a functional sensitivity of <=0.01 uIU/mL. Performed at [facility], [address] [place], [place] [address]   Vitamin B12     Status: Abnormal   Collection Time: 11/21/20 10:54 AM  Result Value Ref Range   Vitamin B-12 1,300 (H) 180 - 914 pg/mL    Comment: (NOTE) This assay is not validated for testing neonatal or myeloproliferative syndrome specimens for Vitamin B12 levels. Performed at [facility], [place] [address] [place], [place] [address]   Folate     Status: None   Collection Time: 11/21/20 10:54 AM  Result Value Ref Range   Folate 66.0 >5.9 ng/mL    Comment: RESULT CONFIRMED BY MANUAL DILUTION MU Performed at [facility], [address] [place], [place] [address] CORRECTED ON 11/02 AT 1437: PREVIOUSLY REPORTED AS >46.0 RESULT CONFIRMED BY MANUAL DILUTION MU   Ammonia     Status: None   Collection Time: 11/21/20  6:05 PM  Result Value Ref Range   Ammonia 34 9 - 35 umol/L    Comment: Performed at [facility], [address] [place], [place] [address]  CBC with Differential/Platelet     Status: Abnormal   Collection Time: 11/22/20   4:36 AM  Result Value Ref Range   WBC 5.4 4.0 - 10.5 K/uL   RBC 4.23 4.22 - 5.81 MIL/uL   Hemoglobin 13.9 13.0 - 17.0 g/dL   HCT 42.2 39.0 - 52.0 %   MCV 99.8 80.0 - 100.0 fL   MCH 32.9 26.0 - 34.0 pg   MCHC 32.9 30.0 - 36.0 g/dL   RDW 13.3 11.5 - 15.5 %   Platelets 165 150 - 400 K/uL   nRBC 0.0 0.0 - 0.2 %   Neutrophils Relative % 52 %   Neutro Abs 2.8 1.7 - 7.7 K/uL   Lymphocytes Relative 31 %   Lymphs Abs 1.7 0.7 - 4.0 K/uL   Monocytes Relative 12 %   Monocytes Absolute 0.6 0.1 - 1.0 K/uL   Eosinophils Relative 2 %   Eosinophils Absolute 0.1 0.0 - 0.5 K/uL   Basophils Relative 1 %   Basophils Absolute 0.0 0.0 - 0.1 K/uL   Immature Granulocytes 2 %   Abs Immature Granulocytes 0.13 (H) 0.00 - 0.07 K/uL    Comment: Performed at [facility], [address] [place], [place] [address]  Comprehensive metabolic panel     Status: Abnormal   Collection Time: 11/22/20  4:36 AM  Result Value Ref Range   Sodium 140 135 - 145 mmol/L   Potassium 4.3 3.5 - 5.1 mmol/L   Chloride 94 (L) 98 - 111 mmol/L   CO2 39 (H) 22 - 32 mmol/L   Glucose, Bld 144 (H) 70 - 99 mg/dL    Comment: Glucose reference range applies only to samples taken after fasting for at least 8 hours.   BUN 17 8 - 23 mg/dL   Creatinine, Ser 1.14 0.61 - 1.24 mg/dL   Calcium 11.4 (H) 8.9 - 10.3 mg/dL   Total Protein 7.0 6.5 - 8.1 g/dL   Albumin 3.3 (L) 3.5 - 5.0 g/dL   AST 24 15 - 41 U/L   ALT 21 0 - 44 U/L   Alkaline Phosphatase 30 (L) 38 - 126 U/L   Total Bilirubin 0.6 0.3 - 1.2 mg/dL   GFR, Estimated >60 >60 mL/min    Comment: (NOTE) Calculated using the CKD-EPI Creatinine Equation (2021)    Anion gap 7 5 - 15    Comment: Performed at [facility], [address] [place], [place] [address]  Magnesium     Status: Abnormal   Collection Time: 11/22/20  4:36 AM  Result Value Ref Range   Magnesium 1.6 (L) 1.7 - 2.4 mg/dL    Comment: Performed at [facility], [address]  [place], [place] [address]  Phosphorus     Status: None   Collection Time: 11/22/20  4:36 AM  Result Value Ref Range   Phosphorus 4.2 2.5 - 4.6 mg/dL    Comment: Performed at [facility], [address] [place], [place] [address]  Ammonia     Status: None   Collection Time: 11/22/20  4:36 AM  Result Value Ref Range   Ammonia 21 9 - 35 umol/L    Comment: Performed at [facility], [address], [place], [place] [address]  Calcium     Status: Abnormal   Collection Time: 11/22/20  8:50 AM  Result Value Ref Range   Calcium 11.1 (H) 8.9 - 10.3 mg/dL    Comment: Performed at [facility], [address] [place], [place] [address]  Albumin     Status: Abnormal   Collection Time: 11/22/20  8:50 AM  Result Value Ref Range   Albumin 3.0 (L) 3.5 - 5.0 g/dL    Comment: Performed at [facility], [address] [place], [place] [address]  VITAMIN D 25 Hydroxy (Vit-D Deficiency, Fractures)     Status: None   Collection Time: 11/22/20 10:53 AM  Result Value Ref Range   Vit D, 25-Hydroxy 33.15 30 - 100 ng/mL    Comment: (NOTE) Vitamin D deficiency has been defined by the [place] practice guideline as a level of serum 25-OH  vitamin D less than 20 ng/mL (1,2). The Endocrine Society went on to  further define vitamin D insufficiency as a level between 21 and 29  ng/mL (2).  1. IOM (Institute of Medicine). 2010. Dietary reference intakes for  calcium and D. [place]: The [employer]. 2. Holick MF, Binkley [place], Bischoff-Ferrari HA, et al. Evaluation,  treatment, and prevention of vitamin D deficiency: an Endocrine  Society clinical practice guideline, JCEM. 2011 Jul; 96(7): 1911-30.  Performed at [facility], [place] [address] [place],  [address]     Current Facility-Administered Medications  Medication Dose Route Frequency Provider Last Rate Last Admin   0.9 %  sodium chloride  infusion   Intravenous PRN Swayze, Ava, DO 10 mL/hr at 11/18/20 1818 10 mL/hr at 11/18/20 1818   acetaminophen (TYLENOL) tablet 650 mg  650 mg Oral Q6H PRN Swayze, Ava, DO       Or   acetaminophen (TYLENOL) suppository 650 mg  650 mg Rectal Q6H PRN Swayze, Ava, DO       albuterol (PROVENTIL) (2.5 MG/3ML) 0.083% nebulizer solution 2.5 mg  2.5 mg Nebulization Q6H Swayze, Ava, DO   2.5 mg at 11/22/20 1332   apixaban (ELIQUIS) tablet 5 mg  5 mg Oral BID Swayze, Ava, DO   5 mg at 11/22/20 1003   bisacodyl (DULCOLAX) EC tablet 5 mg  5 mg Oral Daily Max Sane, MD   5 mg at 11/22/20 1004   DULoxetine (CYMBALTA) DR capsule 30 mg  30 mg Oral BID Swayze, Ava, DO   30 mg at 11/22/20 1003   fenofibrate tablet 160 mg  160 mg Oral Daily Swayze, Ava, DO   160 mg at 31/49/70 2637   folic acid (FOLVITE) tablet 1 mg  1 mg Oral Daily Swayze, Ava, DO   1 mg at 11/22/20 1004   guaiFENesin (ROBITUSSIN) 100 MG/5ML liquid 5 mL  5 mL Oral Q4H PRN Max Sane, MD   5 mL at 11/21/20 1852   haloperidol lactate (HALDOL) injection 5 mg  5 mg Intravenous Q6H PRN Swayze, Ava, DO       influenza vaccine adjuvanted (FLUAD) injection 0.5 mL  0.5 mL Intramuscular Once Swayze, Ava, DO       irbesartan (AVAPRO) tablet 150 mg  150 mg Oral Daily Elodia [place]., MD   150 mg at 11/22/20 1003   lactulose (CHRONULAC) 10 GM/15ML solution 20 g  20 g Oral Daily Elodia [place]., MD   20 g at 11/22/20 1004   metoprolol tartrate (LOPRESSOR) tablet 25 mg  25 mg Oral BID Max Sane, MD   25 mg at 11/21/20 2121   mirtazapine (REMERON) tablet 15 mg  15 mg Oral QHS Nichalas Coin, Madie Reno, MD   15 mg at 11/21/20 2120   multivitamin with minerals tablet 1 tablet  1 tablet Oral Daily Swayze, Ava, DO   1 tablet at 11/22/20 1003   ondansetron (ZOFRAN) tablet 4 mg  4 mg Oral Q6H PRN Swayze, Ava, DO       Or   ondansetron (ZOFRAN) injection 4 mg  4 mg Intravenous Q6H PRN Swayze, Ava, DO       senna-docusate (Senokot-S) tablet 2 tablet  2 tablet  Oral BID Max Sane, MD   2 tablet at 11/22/20 1005   simvastatin (ZOCOR) tablet 40 mg  40 mg Oral QHS Swayze, Ava, DO   40 mg at 11/21/20 2121    Musculoskeletal: Strength & Muscle Tone: decreased Gait & Station: unable to stand Patient leans: N/A            Psychiatric Specialty Exam:  Presentation  General Appearance: No data recorded Eye Contact:No data recorded Speech:No data recorded Speech Volume:No data recorded Handedness:No data recorded  Mood and Affect  Mood:No data recorded Affect:No data recorded  Thought Process  Thought Processes:No data recorded Descriptions of Associations:No data recorded Orientation:No data recorded Thought Content:No data recorded History of Schizophrenia/Schizoaffective disorder:No data recorded Duration of Psychotic Symptoms:No data recorded Hallucinations:No data recorded Ideas of Reference:No data recorded Suicidal Thoughts:No data recorded Homicidal Thoughts:No data recorded  Sensorium  Memory:No data recorded Judgment:No data recorded Insight:No data  recorded  Executive Functions  Concentration:No data recorded Attention Span:No data recorded [place] recorded [place] recorded  Psychomotor Activity  Psychomotor Activity:No data recorded  Assets  Assets:No data recorded  Sleep  Sleep:No data recorded  Physical Exam: Physical Exam Vitals and nursing note reviewed.  Constitutional:      Appearance: He is ill-appearing.  HENT:     Head: Normocephalic and atraumatic.     Mouth/Throat:     Pharynx: Oropharynx is clear.  Eyes:     Pupils: Pupils are equal, round, and reactive to light.  Cardiovascular:     Rate and Rhythm: Normal rate and regular rhythm.  Pulmonary:     Effort: Pulmonary effort is normal.     Breath sounds: Normal breath sounds.  Abdominal:     General: Abdomen is flat.     Palpations: Abdomen is soft.  Musculoskeletal:         General: Normal range of motion.  Skin:    General: Skin is warm and dry.  Neurological:     General: No focal deficit present.     Mental Status: Mental status is at baseline.  Psychiatric:        Attention and Perception: He is inattentive.        Mood and Affect: Mood normal. Affect is blunt.        Speech: Speech is delayed and slurred.        Behavior: Behavior is withdrawn.        Cognition and Memory: Cognition is impaired.   Review of Systems  Unable to perform ROS: Mental status change  Blood pressure (!) 171/88, pulse (!) 107, temperature 98.2 F (36.8 C), temperature source Oral, resp. rate 20, height 5\' 8"  (1.727 m), weight 108.9 kg, SpO2 96 %. Body mass index is 36.49 kg/m.  Treatment Plan Summary: Medication management and Plan reviewed plan with wife.  Started low-dose mirtazapine last night.  Possible that [place] be because of some of his sedation today.  Reviewed with wife the need to give it some time to see some improvement in mood.  Psychiatric team will continue to follow up while he is in the hospital.  Will not alter or increased dosage of medicine yet.  Disposition: No evidence of imminent risk to self or others at present.   Patient does not meet criteria for psychiatric inpatient admission. See note  Alethia Berthold, MD 11/22/2020 2:34 PM

## 2020-11-23 ENCOUNTER — Inpatient Hospital Stay: Payer: Medicare HMO

## 2020-11-23 DIAGNOSIS — F331 Major depressive disorder, recurrent, moderate: Secondary | ICD-10-CM | POA: Diagnosis not present

## 2020-11-23 LAB — CBC WITH DIFFERENTIAL/PLATELET
Abs Immature Granulocytes: 0.06 10*3/uL (ref 0.00–0.07)
Basophils Absolute: 0 10*3/uL (ref 0.0–0.1)
Basophils Relative: 1 %
Eosinophils Absolute: 0.1 10*3/uL (ref 0.0–0.5)
Eosinophils Relative: 3 %
HCT: 37.9 % — ABNORMAL LOW (ref 39.0–52.0)
Hemoglobin: 12.7 g/dL — ABNORMAL LOW (ref 13.0–17.0)
Immature Granulocytes: 2 %
Lymphocytes Relative: 36 %
Lymphs Abs: 1.4 10*3/uL (ref 0.7–4.0)
MCH: 33.2 pg (ref 26.0–34.0)
MCHC: 33.5 g/dL (ref 30.0–36.0)
MCV: 99 fL (ref 80.0–100.0)
Monocytes Absolute: 0.5 10*3/uL (ref 0.1–1.0)
Monocytes Relative: 13 %
Neutro Abs: 1.8 10*3/uL (ref 1.7–7.7)
Neutrophils Relative %: 45 %
Platelets: 158 10*3/uL (ref 150–400)
RBC: 3.83 MIL/uL — ABNORMAL LOW (ref 4.22–5.81)
RDW: 13.2 % (ref 11.5–15.5)
WBC: 3.9 10*3/uL — ABNORMAL LOW (ref 4.0–10.5)
nRBC: 0 % (ref 0.0–0.2)

## 2020-11-23 LAB — BASIC METABOLIC PANEL
Anion gap: 8 (ref 5–15)
BUN: 22 mg/dL (ref 8–23)
CO2: 35 mmol/L — ABNORMAL HIGH (ref 22–32)
Calcium: 11.4 mg/dL — ABNORMAL HIGH (ref 8.9–10.3)
Chloride: 96 mmol/L — ABNORMAL LOW (ref 98–111)
Creatinine, Ser: 1.41 mg/dL — ABNORMAL HIGH (ref 0.61–1.24)
GFR, Estimated: 53 mL/min — ABNORMAL LOW (ref >60)
Glucose, Bld: 157 mg/dL — ABNORMAL HIGH (ref 70–99)
Potassium: 4.1 mmol/L (ref 3.5–5.1)
Sodium: 139 mmol/L (ref 135–145)

## 2020-11-23 LAB — COMPREHENSIVE METABOLIC PANEL
ALT: 17 U/L (ref 0–44)
AST: 19 U/L (ref 15–41)
Albumin: 2.9 g/dL — ABNORMAL LOW (ref 3.5–5.0)
Alkaline Phosphatase: 27 U/L — ABNORMAL LOW (ref 38–126)
Anion gap: 7 (ref 5–15)
BUN: 21 mg/dL (ref 8–23)
CO2: 34 mmol/L — ABNORMAL HIGH (ref 22–32)
Calcium: 11.2 mg/dL — ABNORMAL HIGH (ref 8.9–10.3)
Chloride: 98 mmol/L (ref 98–111)
Creatinine, Ser: 1.22 mg/dL (ref 0.61–1.24)
GFR, Estimated: >60 mL/min (ref >60)
Glucose, Bld: 119 mg/dL — ABNORMAL HIGH (ref 70–99)
Potassium: 3.9 mmol/L (ref 3.5–5.1)
Sodium: 139 mmol/L (ref 135–145)
Total Bilirubin: 0.7 mg/dL (ref 0.3–1.2)
Total Protein: 6.3 g/dL — ABNORMAL LOW (ref 6.5–8.1)

## 2020-11-23 LAB — PHOSPHORUS: Phosphorus: 4 mg/dL (ref 2.5–4.6)

## 2020-11-23 LAB — ALBUMIN: Albumin: 2.9 g/dL — ABNORMAL LOW (ref 3.5–5.0)

## 2020-11-23 LAB — MAGNESIUM: Magnesium: 1.5 mg/dL — ABNORMAL LOW (ref 1.7–2.4)

## 2020-11-23 MED ORDER — MAGNESIUM SULFATE 4 GM/100ML IV SOLN
4.0000 g | Freq: Once | INTRAVENOUS | Status: AC
  Administered 2020-11-23: 4 g via INTRAVENOUS
  Filled 2020-11-23: qty 100

## 2020-11-23 MED ORDER — METOPROLOL SUCCINATE ER 50 MG PO TB24
100.0000 mg | ORAL_TABLET | Freq: Every day | ORAL | Status: DC
  Administered 2020-11-24 – 2020-11-29 (×6): 100 mg via ORAL
  Filled 2020-11-23 (×6): qty 2

## 2020-11-23 NOTE — Progress Notes (Signed)
Attempted to wean off O2.  Patient desat to 87% on RA.  2L O2 per  reapplied with O2 sats 93%.  Dr. Florene Glen made aware.

## 2020-11-23 NOTE — TOC Progression Note (Signed)
Transition of Care (TOC) - Progression Note    Patient Details  Name: [name] Gencarelli Memmer Jr. MRN: [id number] Date of Birth: May 09, 1945  Transition of [facility]) CM/SW Contact  [name] Hutching, RN Phone Number: 11/23/2020, 1:54 PM  Clinical Narrative:    Patient's wife accepts bed offer at [facility].  Patient is not medically cleared today for discharge.  MD says maybe tomorrow.     Expected Discharge Plan: [place] Barriers to Discharge: Continued Medical Work up  Expected Discharge Plan and Services Expected Discharge Plan: [place] In-house Referral: Clinical Social Work   Post Acute Care Choice: [place] Living arrangements for the past 2 months: Single Family Home                                       Social Determinants of Health (SDOH) Interventions    Readmission Risk Interventions No flowsheet data found.

## 2020-11-23 NOTE — Progress Notes (Signed)
Occupational Therapy Treatment Patient Details Name: [name] Eisenberger Dimmer Jr. MRN: [id number] DOB: Mar 09, 1945 Today's Date: 11/23/2020   History of present illness Pt is 75 yr old man with a known history of CAD, PAF, DM II, Peripheral neuropathy, hypertension, and history of stroke who according to his wife has intermittent episodes of confusion and disorientation at baseline. Pt presented to [facility] with increased short of breath with a progressively worsening cough for 4 days. He was admitted for acute hypoxic respiratory failure and acute metabolic encephalopathy   OT comments  Pt seen for OT tx this date to f/u re: safety with ADLs/ADL mobility. Pt requires MOD A to propel towards HOB to optimize positioning as well as cues to sequence. Pt placed in chair position and requires SETUP and MIN cues for meal. Once SETUP and pt oriented to situation, pt able to continue eating dinner. Left with bed alarm on, all needs met and in reach.    Recommendations for follow up therapy are one component of a multi-disciplinary discharge planning process, led by the attending physician.  Recommendations [place] be updated based on patient status, additional functional criteria and insurance authorization.    Follow Up Recommendations  Skilled nursing-short term rehab (<3 hours/day)    Assistance Recommended at Discharge Frequent or constant Supervision/Assistance  Equipment Recommendations  Other (comment) (defer)    Recommendations for Other Services      Precautions / Restrictions Precautions Precautions: Fall Restrictions Weight Bearing Restrictions: No       Mobility Bed Mobility Overal bed mobility: Needs Assistance             General bed mobility comments: MOD A for scooting towards HOB with bed slightly in trend, demos good use of bridging when cued to propel.    Transfers                         Balance                                           ADL either  performed or assessed with clinical judgement   ADL Overall ADL's : Needs assistance/impaired Eating/Feeding: Set up;Sitting Eating/Feeding Details (indicate cue type and reason): tray arranges, cues for opening items, sequencing                                         Vision Patient Visual Report: No change from baseline Additional Comments: moderate visual attn, moderate alertness/participation   Perception     Praxis      Cognition Arousal/Alertness: Awake/alert Behavior During Therapy: WFL for tasks assessed/performed Overall Cognitive Status: History of cognitive impairments - at baseline                                 General Comments: drowsy, but conversive and particiaptory. Follows all commands.          Exercises Other Exercises Other Exercises: OT enages pt in self feeding with cues for sequencing adn SETUP to arrange meal tray. OT provides MOD A for pt to propel towards [facility] and then places bed in chair position to optimize for meal time.   Shoulder Instructions       General  Comments      Pertinent Vitals/ Pain       Pain Assessment: Faces Faces Pain Scale: No hurt  Home Living                                          Prior Functioning/Environment              Frequency  Min 1X/week        Progress Toward Goals  OT Goals(current goals can now be found in the care plan section)  Progress towards OT goals: OT to reassess next treatment  Acute Rehab OT Goals Patient Stated Goal: home with wife OT Goal Formulation: With patient Time For Goal Achievement: 12/03/20 Potential to Achieve Goals: [place] Discharge plan remains appropriate    Co-evaluation                 AM-PAC OT "6 Clicks" Daily Activity     Outcome Measure   Help from another person eating meals?: A Little Help from another person taking care of personal grooming?: A Little Help from another person toileting,  which includes using toliet, bedpan, or urinal?: A Lot Help from another person bathing (including washing, rinsing, drying)?: A Lot Help from another person to put on and taking off regular upper body clothing?: A Little Help from another person to put on and taking off regular lower body clothing?: A Lot 6 Click Score: 15    End of Session    OT Visit Diagnosis: Unsteadiness on feet (R26.81);Muscle weakness (generalized) (M62.81)   Activity Tolerance Patient tolerated treatment well;Patient limited by fatigue   Patient Left with call bell/phone within reach;in bed;with bed alarm set (chair position)   Nurse Communication          Time: 1582-6587 OT Time Calculation (min): 12 min  Charges: OT General Charges $OT Visit: 1 Visit OT Treatments $Self Care/Home Management : 8-22 mins  Gerrianne Scale, [place], OTR/L ascom [phone] 11/23/20, 6:26 PM

## 2020-11-23 NOTE — Progress Notes (Signed)
PROGRESS NOTE    [name] Swoyer Jaquay Jr.  HER:[id number] DOB: [birth year] DOA: 11/17/2020 PCP: Albina Billet, MD   Chief Complaint  Patient presents with   Altered Mental Status    Brief Narrative:  75 yr old man with [name] [name] known history of CAD, PAF, DM II, Peripheral neuropathy, Hypertension, and history of stroke who according to his wife has intermittent episodes of confusion and disorientation at baseline. He has been increasingly short of breath with Ineze Serrao progressively worsening cough for 4 days.  He is admitted for acute hypoxic respiratory failure and acute metabolic encephalopathy   10/30 -possible aspiration event.  Oxygen requirement went up to 4 L.  Giving 40 of Lasix based on the chest x-ray showing pulmonary vascular congestion. 10/31: Weaned off to 2.5 L oxygen.  Pulmonary seen transferred to any MedSurg giving additional 40 mg of Lasix today PT, OT consult 11/1: PT,OT recommends SNF, palliative care consult  Assessment & Plan:   Principal Problem:   Depression Active Problems:   Peripheral neuropathy   Diabetes ([place])   Hyperlipidemia   Toxic metabolic encephalopathy   Coronary artery disease   Hypertension   Acute bronchitis due to infection   Community acquired pneumonia   Acute respiratory failure with hypoxia and hypercapnia (HCC)   Goals of care, counseling/discussion  Acute respiratory failure with hypoxia and hypercapnia (HCC) Currently on 2 L [place], will try to wean as tolerated CXR with mild LLL atelectasis/infiltrate with slight progression, possible small L effusion Being treated for pneumonia (abx completed) and heart failure with lasix (currently on hold)  Community acquired pneumonia Ceftriaxone/azithro 10/29 - 11/2 Pulmonary consult - recommened RVP (not collected), fungitell (negative), legionella ab (pending), urine strep negative, histoplasma urine ag, sputum cx - pending collection, afb expectorated specimen (discussed with pulm who did not recommend  isolation) Consider influenza vaccine prior to d/c   HFpEF exacerbation Edema improving Continue lasix as tolerated (on hold for now)  Hypercalcemia Not overtly symptomatic at this time - gradually worsening over past few days Give gentle IVF and follow - improving with IVF Caution with volume status in setting of above Follow PTH, pending Stop vitamin D  Toxic metabolic encephalopathy Likely related to resp failure and infection Ammonia elevated - will repeat, [name] [name] need lactulose B12 elevated, folate wnl, TSH wnl VBG without hypercarbia Delirium precautions - seems improved  Cognitive Deficit  Hallucinations Concerning for dementia Needs outpatient neurology  Essential Tremor Needs neuro follow outpt  Suicidal Ideation Psych c/s, appreciate assistance - started mirtazepine, doesn't meet criteria for inpatient psych admission  Deconditioning PT/OT recommending SNF  Goals of care, counseling/discussion Palliative care consult.  Patient is DNR.  Appreciate palliative care.   Hypertension Metoprolol irbesartan   Coronary artery disease Continue statin and Eliquis (will need to review why eliquis?)   metoprolol    Hyperlipidemia Continue fenofibrate   Diabetes (HCC) Hemoglobin A1c of 6.  Blood sugars seem to be fairly well controlled   Peripheral neuropathy On Cymbalta  DVT prophylaxis: eliquis Code Status:DNR Family Communication: wife at bedside Disposition:   Status is: Inpatient  Remains inpatient appropriate because: continued acute hypoxic resp failure       Consultants:  pulmonology  Procedures: Echo IMPRESSIONS     1. Left ventricular ejection fraction, by estimation, is 60 to 65%. The  left ventricle has normal function. The left ventricle has no regional  wall motion abnormalities. Left ventricular diastolic parameters were  normal.   2. Right ventricular systolic function is  normal. The right ventricular  size is normal. There is  mildly elevated pulmonary artery systolic  pressure.   3. The mitral valve is normal in structure. Trivial mitral valve  regurgitation.   4. The aortic valve is normal in structure. Aortic valve regurgitation is  not visualized.   Antimicrobials:  Anti-infectives (From admission, onward)    Start     Dose/Rate Route Frequency Ordered Stop   11/17/20 1630  cefTRIAXone (ROCEPHIN) 2 g in sodium chloride 0.9 % 100 mL IVPB        2 g 200 mL/hr over 30 Minutes Intravenous Every 24 hours 11/17/20 1624 11/21/20 1751   11/17/20 1630  azithromycin (ZITHROMAX) 500 mg in sodium chloride 0.9 % 250 mL IVPB        500 mg 250 mL/hr over 60 Minutes Intravenous Every 24 hours 11/17/20 1624 11/21/20 1942          Subjective: No complaints at this time  Objective: Vitals:   11/23/20 0723 11/23/20 0801 11/23/20 1114 11/23/20 1419  BP:  (!) 166/89 136/78   Pulse:  84 82   Resp:  20 18   Temp:  97.9 F (36.6 C) 97.6 F (36.4 C)   TempSrc:   Oral   SpO2: 95% 97% 95% 94%  Weight:      Height:        Intake/Output Summary (Last 24 hours) at 11/23/2020 1439 Last data filed at 11/23/2020 0428 Gross per 24 hour  Intake 557.65 ml  Output 650 ml  Net -92.35 ml   Filed Weights   11/17/20 1119  Weight: 108.9 kg    Examination:  General: No acute distress. Cardiovascular: RRR Lungs: Clear to auscultation bilaterally  Abdomen: Soft, nontender, nondistended  Neurological: Alert and oriented 3. Moves all extremities 4 . Cranial nerves II through XII grossly intact. Skin: Warm and dry. No rashes or lesions. Extremities: No clubbing or cyanosis. No edema.  Data Reviewed: I have personally reviewed following labs and imaging studies  CBC: Recent Labs  Lab 11/19/20 0459 11/20/20 0613 11/21/20 0629 11/22/20 0436 11/23/20 0644  WBC 5.1 4.7 6.4 5.4 3.9*  NEUTROABS  --   --   --  2.8 1.8  HGB 13.1 12.9* 13.6 13.9 12.7*  HCT 37.0* 36.9* 39.1 42.2 37.9*  MCV 99.2 97.4 99.5 99.8 99.0   PLT 145* 142* 171 165 992    Basic Metabolic Panel: Recent Labs  Lab 11/19/20 0459 11/20/20 0613 11/21/20 0629 11/22/20 0436 11/22/20 0850 11/22/20 1646 11/23/20 0644  NA 137 137 139 140  --  139 139  K 2.9* 3.2* 4.5 4.3  --  4.0 3.9  CL 94* 95* 99 94*  --  94* 98  CO2 34* 35* 34* 39*  --  34* 34*  GLUCOSE 140* 148* 153* 144*  --  144* 119*  BUN 13 13 14 17   --  20 21  CREATININE 0.72 0.86 0.76 1.14  --  1.22 1.22  CALCIUM 9.4 10.0 10.9* 11.4* 11.1* 12.0* 11.2*  MG 1.2*  --  1.5* 1.6*  --   --  1.5*  PHOS  --   --   --  4.2  --   --  4.0    GFR: Estimated Creatinine Clearance: 64.5 mL/min (by C-G formula based on SCr of 1.22 mg/dL).  Liver Function Tests: Recent Labs  Lab 11/17/20 1126 11/18/20 0624 11/22/20 0436 11/22/20 0850 11/22/20 1646 11/23/20 0644  AST 24 20 24   --   --  19  ALT 21 19 21   --   --  17  ALKPHOS 32* 33* 30*  --   --  27*  BILITOT 0.9 0.8 0.6  --   --  0.7  PROT 6.4* 6.0* 7.0  --   --  6.3*  ALBUMIN 2.9* 2.8* 3.3* 3.0* 3.5 2.9*    CBG: No results for input(s): GLUCAP in the last 168 hours.   Recent Results (from the past 240 hour(s))  Urine Culture     Status: Abnormal   Collection Time: 11/17/20 12:31 PM   Specimen: In/Out Cath Urine  Result Value Ref Range Status   Specimen Description   Final    IN/OUT CATH URINE Performed at [facility], [address], [place], [place] [address]    Special Requests   Final    NONE Performed at [facility], [address] [place], [place] [address]    Culture ([name] [name])  Final    20,000 COLONIES/mL AEROCOCCUS SPECIES Standardized susceptibility testing for this organism is not available. Performed at [facility], [place] [address], [place], [place] [address]    Report Status 11/22/2020 FINAL  Final  Blood culture (routine single)     Status: None   Collection Time: 11/17/20  3:42 PM   Specimen: BLOOD  Result Value Ref Range Status   Specimen Description BLOOD RIGHT  ANTECUBITAL  Final   Special Requests   Final    BOTTLES DRAWN AEROBIC AND ANAEROBIC Blood Culture adequate volume   Culture   Final    NO GROWTH 5 DAYS Performed at [facility], [address] [place], [place] [address]    Report Status 11/22/2020 FINAL  Final  Resp Panel by RT-PCR (Flu [name] [name]&B, Covid) Nasopharyngeal Swab     Status: None   Collection Time: 11/17/20  3:56 PM   Specimen: Nasopharyngeal Swab; Nasopharyngeal(NP) swabs in vial transport medium  Result Value Ref Range Status   SARS Coronavirus 2 by RT PCR NEGATIVE NEGATIVE Final    Comment: (NOTE) SARS-CoV-2 target nucleic acids are NOT DETECTED.  The SARS-CoV-2 RNA is generally detectable in upper respiratory specimens during the acute phase of infection. The lowest concentration of SARS-CoV-2 viral copies this assay can detect is 138 copies/mL. [name] [name] negative result does not preclude SARS-Cov-2 infection and should not be used as the sole basis for treatment or other patient management decisions. [name] [name] negative result [name] [name] occur with  improper specimen collection/handling, submission of specimen other than nasopharyngeal swab, presence of viral mutation(s) within the areas targeted by this assay, and inadequate number of viral copies(<138 copies/mL). [name] [name] negative result must be combined with clinical observations, patient history, and epidemiological information. The expected result is Negative.  Fact Sheet for Patients:  EntrepreneurPulse.com.au  Fact Sheet for Healthcare Providers:  IncredibleEmployment.be  This test is no t yet approved or cleared by the [place] FDA and  has been authorized for detection and/or diagnosis of SARS-CoV-2 by FDA under an Emergency Use Authorization (EUA). This EUA will remain  in effect (meaning this test can be used) for the duration of the COVID-19 declaration under Section 564(b)(1) of the Act, 21 U.S.C.section 360bbb-3(b)(1), unless the  authorization is terminated  or revoked sooner.       Influenza [name] [name] by PCR NEGATIVE NEGATIVE Final   Influenza B by PCR NEGATIVE NEGATIVE Final    Comment: (NOTE) The Xpert Xpress SARS-CoV-2/FLU/RSV plus assay is intended as an aid in the diagnosis of influenza from Nasopharyngeal  swab specimens and should not be used as [name] [name] sole basis for treatment. Nasal washings and aspirates are unacceptable for Xpert Xpress SARS-CoV-2/FLU/RSV testing.  Fact Sheet for Patients: EntrepreneurPulse.com.au  Fact Sheet for Healthcare Providers: IncredibleEmployment.be  This test is not yet approved or cleared by the [place] FDA and has been authorized for detection and/or diagnosis of SARS-CoV-2 by FDA under an Emergency Use Authorization (EUA). This EUA will remain in effect (meaning this test can be used) for the duration of the COVID-19 declaration under Section 564(b)(1) of the Act, 21 U.S.C. section 360bbb-3(b)(1), unless the authorization is terminated or revoked.  Performed at [facility], [address] [place], [place] [address]   Blood culture (single)     Status: None   Collection Time: 11/17/20  4:43 PM   Specimen: BLOOD  Result Value Ref Range Status   Specimen Description BLOOD RIGHT HAND  Final   Special Requests   Final    BOTTLES DRAWN AEROBIC AND ANAEROBIC Blood Culture results [place] not be optimal due to an inadequate volume of blood received in culture bottles   Culture   Final    NO GROWTH 5 DAYS Performed at [facility], [address] [place], [place] [address]    Report Status 11/22/2020 FINAL  Final         Radiology Studies: [facility] Chest Port 1 View  Result Date: 11/23/2020 CLINICAL DATA:  Hypoxia.  Altered mental status EXAM: PORTABLE CHEST 1 VIEW COMPARISON:  11/21/2020 FINDINGS: Cardiac enlargement. Negative for heart failure or edema. Mild left lower lobe airspace disease with slight progression.  Possible small left effusion. Right lung clear IMPRESSION: Mild left lower lobe atelectasis infiltrate with slight progression. Electronically Signed   By: Franchot Gallo M.D.   On: 11/23/2020 13:12        Scheduled Meds:  albuterol  2.5 mg Nebulization Q6H   apixaban  5 mg Oral BID   bisacodyl  5 mg Oral Daily   DULoxetine  30 mg Oral BID   fenofibrate  160 mg Oral Daily   folic acid  1 mg Oral Daily   influenza vaccine adjuvanted  0.5 mL Intramuscular Once   irbesartan  150 mg Oral Daily   lactulose  20 g Oral Daily   [START ON 11/24/2020] metoprolol succinate  100 mg Oral Daily   mirtazapine  15 mg Oral QHS   multivitamin with minerals  1 tablet Oral Daily   senna-docusate  2 tablet Oral BID   simvastatin  40 mg Oral QHS   Continuous Infusions:  sodium chloride 10 mL/hr (11/18/20 1818)   sodium chloride 75 mL/hr at 11/23/20 0953     LOS: 6 days    Time spent: over 30 min    Fayrene Helper, MD [facility]   To contact the attending provider between 7A-7P or the covering provider during after hours 7P-7A, please log into the web site [email or web address] and access using universal [place] password for that web site. If you do not have the password, please call the hospital operator.  11/23/2020, 2:39 PM

## 2020-11-23 NOTE — Consult Note (Signed)
[facility] Consult   Reason for Consult:  follow-up, depression Referring Physician:  Dr. Florene Glen Patient Identification: [name] Cross Boyte Jr. MRN:  [id number] Principal Diagnosis: Depression Diagnosis:  Principal Problem:   Depression Active Problems:   Peripheral neuropathy   Diabetes ([place])   Hyperlipidemia   Toxic metabolic encephalopathy   Coronary artery disease   Hypertension   Acute bronchitis due to infection   Community acquired pneumonia   Acute respiratory failure with hypoxia and hypercapnia (HCC)   Goals of care, counseling/discussion   Total Time spent with patient: 20 minutes  Subjective:   [name] Cross Klaiber Brooke Bonito. is a 75 y.o. male patient admitted with patient currently sedated and not able to give much history.[name]   HPI: Patient seen chart reviewed.   Patient reports "doing okay". Reports feeling less depressed and that he slept well last night and is still sleepy during the day. He denies feeling suicidal or homicidal. Denies feeling anxious. Denies seeing or hearing things other people cannot see or hear. His appetite is low, but he is trying to ear some.  Patient 's wife was present and reported that patient slept well last night and seems to be a bit groggy today. She reports she had noticed his mood being a little brighter, sure not worse. She confirmed that the patient did not express suicidal thoughts today.  Past Psychiatric History: Patient has a past history of depression but without much in the way of aggressive treatment mostly chronic pain and multiple medical problems no history of suicide attempts or hospitalization   Risk to Self:   Risk to Others:   Prior Inpatient Therapy:   Prior Outpatient Therapy:    Past Medical History:  Past Medical History:  Diagnosis Date   Arthritis    Coronary artery disease    Depression    STARTED AFTER  OPEN HEART SURG.   Diabetes mellitus without complication ([place])    Borderline   Foot pain, bilateral     Gout    Hypertension    Insomnia 10/27/2012   Lumbosacral spinal stenosis    Myocardial infarction ([place])    Neuropathy    FIBRO        SEES  DR. Raliegh Ip  WILLIS   Restless leg syndrome    Stroke [facility])    2008  RIGHT SIDE...HAD FOR A COUPLE OF DAYS AND THEN WENT AWAY   Stroke ([place])    WENT TO [place]  2008    Past Surgical History:  Procedure Laterality Date   BACK SURGERY     CARDIAC CATHETERIZATION     COLONOSCOPY WITH PROPOFOL N/A 11/10/2018   Procedure: COLONOSCOPY WITH PROPOFOL;  Surgeon: Robert Bellow, MD;  Location: [facility] ENDOSCOPY;  Service: Endoscopy;  Laterality: N/A;   CORONARY ARTERY BYPASS GRAFT     (2010 @ [facility]     ?? BLEPHOPLASTY   HAND SURGERY     CRUSHING INJURY---1966  HAS ABOUT 6 SURGERIES THEN   HAND SURGERY Right    HERNIA REPAIR     BILATERAL   LUMBAR FUSION  11/25/2011   POSTERIOR   Family History:  Family History  Problem Relation Age of Onset   Diabetes Mother    Family Psychiatric  History: n/a Social History:  Social History   Substance and Sexual Activity  Alcohol Use Yes   Alcohol/week: 12.0 standard drinks   Types: 4 Glasses of wine, 8 Shots of liquor per week   Comment:  occasional     Social History   Substance and Sexual Activity  Drug Use No    Social History   Socioeconomic History   Marital status: Married    Spouse name: Not on file   Number of children: Not on file   Years of education: Not on file   Highest education level: Not on file  Occupational History   Not on file  Tobacco Use   Smoking status: Former    Packs/day: 1.00    Years: 30.00    Pack years: 30.00    Types: Cigarettes    Start date: 01/21/1968    Quit date: 01/20/2006    Years since quitting: 14.8   Smokeless tobacco: Former    Quit date: 01/25/2006  Vaping Use   Vaping Use: Never used  Substance and Sexual Activity   Alcohol use: Yes    Alcohol/week: 12.0 standard drinks    Types: 4 Glasses of wine, 8 Shots of liquor per week     Comment: occasional   Drug use: No   Sexual activity: Not on file  Other Topics Concern   Not on file  Social History Narrative   Not on file   Social Determinants of Health   Financial Resource Strain: Not on file  Food Insecurity: Not on file  Transportation Needs: Not on file  Physical Activity: Not on file  Stress: Not on file  Social Connections: Not on file   Additional Social History:    Allergies:  No Known Allergies  Labs:  Results for orders placed or performed during the hospital encounter of 11/17/20 (from the past 48 hour(s))  Ammonia     Status: None   Collection Time: 11/21/20  6:05 PM  Result Value Ref Range   Ammonia 34 9 - 35 umol/L    Comment: Performed at [facility], [address] [place], [place] [address]  CBC with Differential/Platelet     Status: Abnormal   Collection Time: 11/22/20  4:36 AM  Result Value Ref Range   WBC 5.4 4.0 - 10.5 K/uL   RBC 4.23 4.22 - 5.81 MIL/uL   Hemoglobin 13.9 13.0 - 17.0 g/dL   HCT 42.2 39.0 - 52.0 %   MCV 99.8 80.0 - 100.0 fL   MCH 32.9 26.0 - 34.0 pg   MCHC 32.9 30.0 - 36.0 g/dL   RDW 13.3 11.5 - 15.5 %   Platelets 165 150 - 400 K/uL   nRBC 0.0 0.0 - 0.2 %   Neutrophils Relative % 52 %   Neutro Abs 2.8 1.7 - 7.7 K/uL   Lymphocytes Relative 31 %   Lymphs Abs 1.7 0.7 - 4.0 K/uL   Monocytes Relative 12 %   Monocytes Absolute 0.6 0.1 - 1.0 K/uL   Eosinophils Relative 2 %   Eosinophils Absolute 0.1 0.0 - 0.5 K/uL   Basophils Relative 1 %   Basophils Absolute 0.0 0.0 - 0.1 K/uL   Immature Granulocytes 2 %   Abs Immature Granulocytes 0.13 (H) 0.00 - 0.07 K/uL    Comment: Performed at [facility], [address] [place], [place] [address]  Comprehensive metabolic panel     Status: Abnormal   Collection Time: 11/22/20  4:36 AM  Result Value Ref Range   Sodium 140 135 - 145 mmol/L   Potassium 4.3 3.5 - 5.1 mmol/L   Chloride 94 (L) 98 - 111 mmol/L   CO2 39 (H) 22 - 32 mmol/L    Glucose,  Bld 144 (H) 70 - 99 mg/dL    Comment: Glucose reference range applies only to samples taken after fasting for at least 8 hours.   BUN 17 8 - 23 mg/dL   Creatinine, Ser 1.14 0.61 - 1.24 mg/dL   Calcium 11.4 (H) 8.9 - 10.3 mg/dL   Total Protein 7.0 6.5 - 8.1 g/dL   Albumin 3.3 (L) 3.5 - 5.0 g/dL   AST 24 15 - 41 U/L   ALT 21 0 - 44 U/L   Alkaline Phosphatase 30 (L) 38 - 126 U/L   Total Bilirubin 0.6 0.3 - 1.2 mg/dL   GFR, Estimated >60 >60 mL/min    Comment: (NOTE) Calculated using the CKD-EPI Creatinine Equation (2021)    Anion gap 7 5 - 15    Comment: Performed at [facility], [address], [name], [place] [address]  Magnesium     Status: Abnormal   Collection Time: 11/22/20  4:36 AM  Result Value Ref Range   Magnesium 1.6 (L) 1.7 - 2.4 mg/dL    Comment: Performed at [facility], [address], [place], [place] [address]  Phosphorus     Status: None   Collection Time: 11/22/20  4:36 AM  Result Value Ref Range   Phosphorus 4.2 2.5 - 4.6 mg/dL    Comment: Performed at [facility], [address] [place], [place] [address]  Ammonia     Status: None   Collection Time: 11/22/20  4:36 AM  Result Value Ref Range   Ammonia 21 9 - 35 umol/L    Comment: Performed at [facility], [address] [place], [place] [address]  Calcium     Status: Abnormal   Collection Time: 11/22/20  8:50 AM  Result Value Ref Range   Calcium 11.1 (H) 8.9 - 10.3 mg/dL    Comment: Performed at [facility], [address] [place], [place] [address]  Albumin     Status: Abnormal   Collection Time: 11/22/20  8:50 AM  Result Value Ref Range   Albumin 3.0 (L) 3.5 - 5.0 g/dL    Comment: Performed at [facility], [address] [place], [place] [address]  VITAMIN D 25 Hydroxy (Vit-D Deficiency, Fractures)     Status: None   Collection Time: 11/22/20 10:53 AM  Result Value Ref Range   Vit D, 25-Hydroxy 33.15 30 - 100 ng/mL     Comment: (NOTE) Vitamin D deficiency has been defined by the [place] practice guideline as a level of serum 25-OH  vitamin D less than 20 ng/mL (1,2). The Endocrine Society went on to  further define vitamin D insufficiency as a level between 21 and 29  ng/mL (2).  1. IOM (Institute of Medicine). 2010. Dietary reference intakes for  calcium and D. [place]: The [employer]. 2. Holick MF, Binkley [place], Bischoff-Ferrari HA, et al. Evaluation,  treatment, and prevention of vitamin D deficiency: an Endocrine  Society clinical practice guideline, JCEM. 2011 Jul; 96(7): 1911-30.  Performed at [facility], [place] [address] [place],  [address]   Basic metabolic panel     Status: Abnormal   Collection Time: 11/22/20  4:46 PM  Result Value Ref Range   Sodium 139 135 - 145 mmol/L   Potassium 4.0 3.5 - 5.1 mmol/L   Chloride 94 (L) 98 - 111 mmol/L   CO2 34 (H) 22 - 32 mmol/L   Glucose, Bld  144 (H) 70 - 99 mg/dL    Comment: Glucose reference range applies only to samples taken after fasting for at least 8 hours.   BUN 20 8 - 23 mg/dL   Creatinine, Ser 1.22 0.61 - 1.24 mg/dL   Calcium 12.0 (H) 8.9 - 10.3 mg/dL   GFR, Estimated >60 >60 mL/min    Comment: (NOTE) Calculated using the CKD-EPI Creatinine Equation (2021)    Anion gap 11 5 - 15    Comment: Performed at [facility], [address] [place], [place] [address]  Albumin     Status: None   Collection Time: 11/22/20  4:46 PM  Result Value Ref Range   Albumin 3.5 3.5 - 5.0 g/dL    Comment: Performed at [facility], [address] [place], [place] [address]  CBC with Differential/Platelet     Status: Abnormal   Collection Time: 11/23/20  6:44 AM  Result Value Ref Range   WBC 3.9 (L) 4.0 - 10.5 K/uL   RBC 3.83 (L) 4.22 - 5.81 MIL/uL   Hemoglobin 12.7 (L) 13.0 - 17.0 g/dL   HCT 37.9 (L) 39.0 - 52.0 %   MCV 99.0 80.0 - 100.0 fL   MCH 33.2 26.0 -  34.0 pg   MCHC 33.5 30.0 - 36.0 g/dL   RDW 13.2 11.5 - 15.5 %   Platelets 158 150 - 400 K/uL   nRBC 0.0 0.0 - 0.2 %   Neutrophils Relative % 45 %   Neutro Abs 1.8 1.7 - 7.7 K/uL   Lymphocytes Relative 36 %   Lymphs Abs 1.4 0.7 - 4.0 K/uL   Monocytes Relative 13 %   Monocytes Absolute 0.5 0.1 - 1.0 K/uL   Eosinophils Relative 3 %   Eosinophils Absolute 0.1 0.0 - 0.5 K/uL   Basophils Relative 1 %   Basophils Absolute 0.0 0.0 - 0.1 K/uL   Immature Granulocytes 2 %   Abs Immature Granulocytes 0.06 0.00 - 0.07 K/uL    Comment: Performed at [facility], [address] [place], [place] [address]  Comprehensive metabolic panel     Status: Abnormal   Collection Time: 11/23/20  6:44 AM  Result Value Ref Range   Sodium 139 135 - 145 mmol/L   Potassium 3.9 3.5 - 5.1 mmol/L   Chloride 98 98 - 111 mmol/L   CO2 34 (H) 22 - 32 mmol/L   Glucose, Bld 119 (H) 70 - 99 mg/dL    Comment: Glucose reference range applies only to samples taken after fasting for at least 8 hours.   BUN 21 8 - 23 mg/dL   Creatinine, Ser 1.22 0.61 - 1.24 mg/dL   Calcium 11.2 (H) 8.9 - 10.3 mg/dL   Total Protein 6.3 (L) 6.5 - 8.1 g/dL   Albumin 2.9 (L) 3.5 - 5.0 g/dL   AST 19 15 - 41 U/L   ALT 17 0 - 44 U/L   Alkaline Phosphatase 27 (L) 38 - 126 U/L   Total Bilirubin 0.7 0.3 - 1.2 mg/dL   GFR, Estimated >60 >60 mL/min    Comment: (NOTE) Calculated using the CKD-EPI Creatinine Equation (2021)    Anion gap 7 5 - 15    Comment: Performed at [facility], [address], [place], [place] [address]  Magnesium     Status: Abnormal   Collection Time: 11/23/20  6:44 AM  Result Value Ref Range   Magnesium 1.5 (L) 1.7 - 2.4 mg/dL    Comment: Performed  at [facility], [address] [place], [place] [address]  Phosphorus     Status: None   Collection Time: 11/23/20  6:44 AM  Result Value Ref Range   Phosphorus 4.0 2.5 - 4.6 mg/dL    Comment: Performed at [facility], [address] [place], [place] [address]    Current Facility-Administered Medications  Medication Dose Route Frequency Provider Last Rate Last Admin   0.9 %  sodium chloride infusion   Intravenous PRN Max Sane, MD 10 mL/hr at 11/18/20 1818 10 mL/hr at 11/18/20 1818   0.9 %  sodium chloride infusion   Intravenous Continuous Elodia Florence., MD 75 mL/hr at 11/23/20 0953 New Bag at 11/23/20 0953   acetaminophen (TYLENOL) tablet 650 mg  650 mg Oral Q6H PRN Swayze, Ava, DO       Or   acetaminophen (TYLENOL) suppository 650 mg  650 mg Rectal Q6H PRN Swayze, Ava, DO       albuterol (PROVENTIL) (2.5 MG/3ML) 0.083% nebulizer solution 2.5 mg  2.5 mg Nebulization Q6H Swayze, Ava, DO   2.5 mg at 11/23/20 0707   apixaban (ELIQUIS) tablet 5 mg  5 mg Oral BID Swayze, Ava, DO   5 mg at 11/23/20 0958   bisacodyl (DULCOLAX) EC tablet 5 mg  5 mg Oral Daily Max Sane, MD   5 mg at 11/23/20 0958   DULoxetine (CYMBALTA) DR capsule 30 mg  30 mg Oral BID Swayze, Ava, DO   30 mg at 11/22/20 2136   fenofibrate tablet 160 mg  160 mg Oral Daily Swayze, Ava, DO   160 mg at 17/61/60 7371   folic acid (FOLVITE) tablet 1 mg  1 mg Oral Daily Swayze, Ava, DO   1 mg at 11/23/20 0958   guaiFENesin (ROBITUSSIN) 100 MG/5ML liquid 5 mL  5 mL Oral Q4H PRN Max Sane, MD   5 mL at 11/21/20 1852   haloperidol lactate (HALDOL) injection 5 mg  5 mg Intravenous Q6H PRN Swayze, Ava, DO       influenza vaccine adjuvanted (FLUAD) injection 0.5 mL  0.5 mL Intramuscular Once Max Sane, MD       irbesartan (AVAPRO) tablet 150 mg  150 mg Oral Daily Elodia Florence., MD   150 mg at 11/23/20 0958   lactulose (CHRONULAC) 10 GM/15ML solution 20 g  20 g Oral Daily Elodia Florence., MD   20 g at 11/23/20 1000   magnesium sulfate IVPB 4 g 100 mL  4 g Intravenous Once Elodia Florence., MD 50 mL/hr at 11/23/20 0957 4 g at 11/23/20 0957   metoprolol tartrate (LOPRESSOR) tablet 25 mg  25 mg Oral BID Max Sane, MD   25 mg at  11/23/20 0958   mirtazapine (REMERON) tablet 15 mg  15 mg Oral QHS Clapacs, Madie Reno, MD   15 mg at 11/22/20 2136   multivitamin with minerals tablet 1 tablet  1 tablet Oral Daily Swayze, Ava, DO   1 tablet at 11/23/20 0958   ondansetron (ZOFRAN) tablet 4 mg  4 mg Oral Q6H PRN Swayze, Ava, DO       Or   ondansetron (ZOFRAN) injection 4 mg  4 mg Intravenous Q6H PRN Swayze, Ava, DO       senna-docusate (Senokot-S) tablet 2 tablet  2 tablet Oral BID Max Sane, MD   2 tablet at 11/22/20 2136   simvastatin (ZOCOR) tablet 40 mg  40 mg Oral QHS Swayze,  Ava, DO   40 mg at 11/22/20 2136    Musculoskeletal: Strength & Muscle Tone: decreased Gait & Station: unable to stand Patient leans: N/A    Psychiatric Specialty Exam: MENTAL STATUS EXAM: Appearance:  CM, appearing stated age, wearing hospital attire. Decreased level of alertness, appropriate facial expression.  Attitude/Behavior: sedated, calm, cooperative as possible.  Motor: dyskinesias not evident.  Speech: clear, coherent.  Mood: "okay".  Affect:  blunted.  Thought process: patient appears coherent, goal-directed.  Thought content: patient denies suicidal thoughts, denies homicidal thoughts; did not express any delusions.  Thought perception: patient denies auditory and visual hallucinations. Did not appear internally stimulated.  Cognition: patient is alert and oriented in self, place.  Insight: fair  Judgement: fair   Physical Exam: Physical Exam ROS Blood pressure 136/78, pulse 82, temperature 97.6 F (36.4 C), temperature source Oral, resp. rate 18, height 5\' 8"  (1.727 m), weight 108.9 kg, SpO2 95 %. Body mass index is 36.49 kg/m.  Treatment Plan Summary: Daily contact with patient to assess and evaluate symptoms and progress in treatment and Medication management  -Patient does not meet criteria for psychiatric inpatient admission.  -continue Mirtazapine 15mg  PO QHS for depression, sleep,  appetite.  -Psychiatric team will continue to follow up while patient is in the hospital.    Disposition: No evidence of imminent risk to self or others at present.   Patient does not meet criteria for psychiatric inpatient admission. Supportive therapy provided about ongoing stressors.  Larita Fife, MD 11/23/2020 11:51 AM

## 2020-11-24 DIAGNOSIS — F331 Major depressive disorder, recurrent, moderate: Secondary | ICD-10-CM | POA: Diagnosis not present

## 2020-11-24 LAB — CBC WITH DIFFERENTIAL/PLATELET
Abs Immature Granulocytes: 0.06 10*3/uL (ref 0.00–0.07)
Basophils Absolute: 0.1 10*3/uL (ref 0.0–0.1)
Basophils Relative: 1 %
Eosinophils Absolute: 0.1 10*3/uL (ref 0.0–0.5)
Eosinophils Relative: 3 %
HCT: 37.4 % — ABNORMAL LOW (ref 39.0–52.0)
Hemoglobin: 12.4 g/dL — ABNORMAL LOW (ref 13.0–17.0)
Immature Granulocytes: 1 %
Lymphocytes Relative: 32 %
Lymphs Abs: 1.3 10*3/uL (ref 0.7–4.0)
MCH: 33.3 pg (ref 26.0–34.0)
MCHC: 33.2 g/dL (ref 30.0–36.0)
MCV: 100.5 fL — ABNORMAL HIGH (ref 80.0–100.0)
Monocytes Absolute: 0.5 10*3/uL (ref 0.1–1.0)
Monocytes Relative: 11 %
Neutro Abs: 2.2 10*3/uL (ref 1.7–7.7)
Neutrophils Relative %: 52 %
Platelets: 133 10*3/uL — ABNORMAL LOW (ref 150–400)
RBC: 3.72 MIL/uL — ABNORMAL LOW (ref 4.22–5.81)
RDW: 13 % (ref 11.5–15.5)
WBC: 4.2 10*3/uL (ref 4.0–10.5)
nRBC: 0 % (ref 0.0–0.2)

## 2020-11-24 LAB — COMPREHENSIVE METABOLIC PANEL
ALT: 16 U/L (ref 0–44)
AST: 19 U/L (ref 15–41)
Albumin: 2.8 g/dL — ABNORMAL LOW (ref 3.5–5.0)
Alkaline Phosphatase: 27 U/L — ABNORMAL LOW (ref 38–126)
Anion gap: 5 (ref 5–15)
BUN: 20 mg/dL (ref 8–23)
CO2: 32 mmol/L (ref 22–32)
Calcium: 11.1 mg/dL — ABNORMAL HIGH (ref 8.9–10.3)
Chloride: 101 mmol/L (ref 98–111)
Creatinine, Ser: 1.37 mg/dL — ABNORMAL HIGH (ref 0.61–1.24)
GFR, Estimated: 54 mL/min — ABNORMAL LOW (ref >60)
Glucose, Bld: 145 mg/dL — ABNORMAL HIGH (ref 70–99)
Potassium: 3.8 mmol/L (ref 3.5–5.1)
Sodium: 138 mmol/L (ref 135–145)
Total Bilirubin: 0.7 mg/dL (ref 0.3–1.2)
Total Protein: 5.9 g/dL — ABNORMAL LOW (ref 6.5–8.1)

## 2020-11-24 LAB — PHOSPHORUS: Phosphorus: 3.4 mg/dL (ref 2.5–4.6)

## 2020-11-24 LAB — MAGNESIUM: Magnesium: 1.9 mg/dL (ref 1.7–2.4)

## 2020-11-24 LAB — PTH, INTACT AND CALCIUM
Calcium, Total (PTH): 11.3 mg/dL — ABNORMAL HIGH (ref 8.6–10.2)
PTH: 13 pg/mL — ABNORMAL LOW (ref 15–65)

## 2020-11-24 LAB — BRAIN NATRIURETIC PEPTIDE: B Natriuretic Peptide: 155.1 pg/mL — ABNORMAL HIGH (ref 0.0–100.0)

## 2020-11-24 LAB — GLUCOSE, CAPILLARY: Glucose-Capillary: 156 mg/dL — ABNORMAL HIGH (ref 70–99)

## 2020-11-24 NOTE — Progress Notes (Signed)
Weaned off patient while in the bed.  O2 sats remains at 91%-94% on RA at rest. Dr. Florene Glen made aware.

## 2020-11-24 NOTE — Progress Notes (Signed)
PROGRESS NOTE    [name] Kossman Pidgeon Jr.  ZOX:[id number] DOB: 10-30-1945 DOA: 11/17/2020 PCP: Albina Billet, MD   Chief Complaint  Patient presents with   Altered Mental Status    Brief Narrative:  75 yr old man with [name] [name] known history of CAD, PAF, DM II, Peripheral neuropathy, Hypertension, and history of stroke who according to his wife has intermittent episodes of confusion and disorientation at baseline. He has been increasingly short of breath with Yumiko Alkins progressively worsening cough for 4 days.  He is admitted for acute hypoxic respiratory failure and acute metabolic encephalopathy   10/30 -possible aspiration event.  Oxygen requirement went up to 4 L.  Giving 40 of Lasix based on the chest x-ray showing pulmonary vascular congestion. 10/31: Weaned off to 2.5 L oxygen.  Pulmonary seen transferred to any MedSurg giving additional 40 mg of Lasix today PT, OT consult 11/1: PT,OT recommends SNF, palliative care consult  Assessment & Plan:   Principal Problem:   Depression Active Problems:   Peripheral neuropathy   Diabetes ([place])   Hyperlipidemia   Toxic metabolic encephalopathy   Coronary artery disease   Hypertension   Acute bronchitis due to infection   Community acquired pneumonia   Acute respiratory failure with hypoxia and hypercapnia (HCC)   Goals of care, counseling/discussion  Acute respiratory failure with hypoxia and hypercapnia (HCC) Currently on 1 L [place], will try to wean as tolerated CXR with mild LLL atelectasis/infiltrate with slight progression, possible small L effusion Being treated for pneumonia (abx completed) and heart failure with lasix (currently on hold)  Community acquired pneumonia Ceftriaxone/azithro 10/29 - 11/2 Pulmonary consult - recommened RVP (not collected), fungitell (negative), legionella ab (pending), urine strep negative, histoplasma urine ag, sputum cx - pending collection, afb expectorated specimen (discussed with pulm who did not recommend  isolation) Consider influenza vaccine prior to d/c   HFpEF exacerbation Edema improving Continue lasix as tolerated (on hold for now)  Hypercalcemia Not overtly symptomatic at this time - gradually worsening over past few days Give gentle IVF and follow - improving with IVF - corrects to 12.1 Nonparathyroid hypercalcemia Vit D 33.15 Follow 1, 25 vit D Follow PTHrp Stop vitamin D  Toxic metabolic encephalopathy Likely related to resp failure and infection Ammonia elevated - will repeat, Scales need lactulose B12 elevated, folate wnl, TSH wnl VBG without hypercarbia Delirium precautions - seems improved  Cognitive Deficit  Hallucinations Concerning for dementia Needs outpatient neurology  Essential Tremor Needs neuro follow outpt  Suicidal Ideation Psych c/s, appreciate assistance - started mirtazepine, doesn't meet criteria for inpatient psych admission  Deconditioning PT/OT recommending SNF  Goals of care, counseling/discussion Palliative care consult.  Patient is DNR.  Appreciate palliative care.   Hypertension Metoprolol irbesartan   Coronary artery disease Continue statin and Eliquis (will need to review why eliquis?)   metoprolol    Hyperlipidemia Continue fenofibrate   Diabetes (HCC) Hemoglobin A1c of 6.  Blood sugars seem to be fairly well controlled   Peripheral neuropathy On Cymbalta  DVT prophylaxis: eliquis Code Status:DNR Family Communication: wife at bedside Disposition:   Status is: Inpatient  Remains inpatient appropriate because: continued acute hypoxic resp failure       Consultants:  pulmonology  Procedures: Echo IMPRESSIONS     1. Left ventricular ejection fraction, by estimation, is 60 to 65%. The  left ventricle has normal function. The left ventricle has no regional  wall motion abnormalities. Left ventricular diastolic parameters were  normal.   2.  Right ventricular systolic function is normal. The right ventricular   size is normal. There is mildly elevated pulmonary artery systolic  pressure.   3. The mitral valve is normal in structure. Trivial mitral valve  regurgitation.   4. The aortic valve is normal in structure. Aortic valve regurgitation is  not visualized.   Antimicrobials:  Anti-infectives (From admission, onward)    Start     Dose/Rate Route Frequency Ordered Stop   11/17/20 1630  cefTRIAXone (ROCEPHIN) 2 g in sodium chloride 0.9 % 100 mL IVPB        2 g 200 mL/hr over 30 Minutes Intravenous Every 24 hours 11/17/20 1624 11/21/20 1751   11/17/20 1630  azithromycin (ZITHROMAX) 500 mg in sodium chloride 0.9 % 250 mL IVPB        500 mg 250 mL/hr over 60 Minutes Intravenous Every 24 hours 11/17/20 1624 11/21/20 1942          Subjective: Denies complaint  Objective: Vitals:   11/24/20 0800 11/24/20 0808 11/24/20 1100 11/24/20 1330  BP: 110/80  (!) 147/80   Pulse: 81  96   Resp: 16  17   Temp: 97.8 F (36.6 C)  97.6 F (36.4 C)   TempSrc: Oral  Oral   SpO2: 100% 100% 98% 93%  Weight:      Height:        Intake/Output Summary (Last 24 hours) at 11/24/2020 1449 Last data filed at 11/24/2020 0729 Gross per 24 hour  Intake 2321.11 ml  Output 2000 ml  Net 321.11 ml   Filed Weights   11/17/20 1119  Weight: 108.9 kg    Examination:  General: No acute distress. Cardiovascular: RRR Lungs: unlabored Abdomen: Soft, nontender, nondistended w Neurological: Alert and oriented 3. Moves all extremities 4. Cranial nerves II through XII grossly intact. Skin: Warm and dry. No rashes or lesions. Extremities: No clubbing or cyanosis. No edema.    Data Reviewed: I have personally reviewed following labs and imaging studies  CBC: Recent Labs  Lab 11/20/20 0613 11/21/20 0629 11/22/20 0436 11/23/20 0644 11/24/20 0413  WBC 4.7 6.4 5.4 3.9* 4.2  NEUTROABS  --   --  2.8 1.8 2.2  HGB 12.9* 13.6 13.9 12.7* 12.4*  HCT 36.9* 39.1 42.2 37.9* 37.4*  MCV 97.4 99.5 99.8 99.0  100.5*  PLT 142* 171 165 158 133*    Basic Metabolic Panel: Recent Labs  Lab 11/19/20 0459 11/20/20 0613 11/21/20 0629 11/22/20 0436 11/22/20 0850 11/22/20 1646 11/23/20 0644 11/23/20 1534 11/24/20 0413  NA 137   < > 139 140  --  139 139 139 138  K 2.9*   < > 4.5 4.3  --  4.0 3.9 4.1 3.8  CL 94*   < > 99 94*  --  94* 98 96* 101  CO2 34*   < > 34* 39*  --  34* 34* 35* 32  GLUCOSE 140*   < > 153* 144*  --  144* 119* 157* 145*  BUN 13   < > 14 17  --  20 21 22 20   CREATININE 0.72   < > 0.76 1.14  --  1.22 1.22 1.41* 1.37*  CALCIUM 9.4   < > 10.9* 11.4* 11.1*  11.3* 12.0* 11.2* 11.4* 11.1*  MG 1.2*  --  1.5* 1.6*  --   --  1.5*  --  1.9  PHOS  --   --   --  4.2  --   --  4.0  --  3.4   < > = values in this interval not displayed.    GFR: Estimated Creatinine Clearance: 57.5 mL/min ([name] [name]) (by C-G formula based on SCr of 1.37 mg/dL (H)).  Liver Function Tests: Recent Labs  Lab 11/18/20 0624 11/22/20 0436 11/22/20 0850 11/22/20 1646 11/23/20 0644 11/23/20 1534 11/24/20 0413  AST 20 24  --   --  19  --  19  ALT 19 21  --   --  17  --  16  ALKPHOS 33* 30*  --   --  27*  --  27*  BILITOT 0.8 0.6  --   --  0.7  --  0.7  PROT 6.0* 7.0  --   --  6.3*  --  5.9*  ALBUMIN 2.8* 3.3* 3.0* 3.5 2.9* 2.9* 2.8*    CBG: No results for input(s): GLUCAP in the last 168 hours.   Recent Results (from the past 240 hour(s))  Urine Culture     Status: Abnormal   Collection Time: 11/17/20 12:31 PM   Specimen: In/Out Cath Urine  Result Value Ref Range Status   Specimen Description   Final    IN/OUT CATH URINE Performed at [facility], [address], [place], [place] [address]    Special Requests   Final    NONE Performed at [facility], [address] [place], [place] [address]    Culture ([name] [name])  Final    20,000 COLONIES/mL AEROCOCCUS SPECIES Standardized susceptibility testing for this organism is not available. Performed at [facility], [place]  [address], [place], [place] [address]    Report Status 11/22/2020 FINAL  Final  Blood culture (routine single)     Status: None   Collection Time: 11/17/20  3:42 PM   Specimen: BLOOD  Result Value Ref Range Status   Specimen Description BLOOD RIGHT ANTECUBITAL  Final   Special Requests   Final    BOTTLES DRAWN AEROBIC AND ANAEROBIC Blood Culture adequate volume   Culture   Final    NO GROWTH 5 DAYS Performed at [facility], [address] [place], [place] [address]    Report Status 11/22/2020 FINAL  Final  Resp Panel by RT-PCR (Flu [name] [name]&B, Covid) Nasopharyngeal Swab     Status: None   Collection Time: 11/17/20  3:56 PM   Specimen: Nasopharyngeal Swab; Nasopharyngeal(NP) swabs in vial transport medium  Result Value Ref Range Status   SARS Coronavirus 2 by RT PCR NEGATIVE NEGATIVE Final    Comment: (NOTE) SARS-CoV-2 target nucleic acids are NOT DETECTED.  The SARS-CoV-2 RNA is generally detectable in upper respiratory specimens during the acute phase of infection. The lowest concentration of SARS-CoV-2 viral copies this assay can detect is 138 copies/mL. [name] [name] negative result does not preclude SARS-Cov-2 infection and should not be used as the sole basis for treatment or other patient management decisions. [name] [name] negative result [place] occur with  improper specimen collection/handling, submission of specimen other than nasopharyngeal swab, presence of viral mutation(s) within the areas targeted by this assay, and inadequate number of viral copies(<138 copies/mL). [name] [name] negative result must be combined with clinical observations, patient history, and epidemiological information. The expected result is Negative.  Fact Sheet for Patients:  EntrepreneurPulse.com.au  Fact Sheet for Healthcare Providers:  IncredibleEmployment.be  This test is no t yet approved or cleared by the [place] FDA and  has been authorized for detection and/or diagnosis of  SARS-CoV-2 by FDA under an Emergency Use Authorization (EUA). This EUA  will remain  in effect (meaning this test can be used) for the duration of the COVID-19 declaration under Section 564(b)(1) of the Act, 21 U.S.C.section 360bbb-3(b)(1), unless the authorization is terminated  or revoked sooner.       Influenza [name] [name] by PCR NEGATIVE NEGATIVE Final   Influenza B by PCR NEGATIVE NEGATIVE Final    Comment: (NOTE) The Xpert Xpress SARS-CoV-2/FLU/RSV plus assay is intended as an aid in the diagnosis of influenza from Nasopharyngeal swab specimens and should not be used as [name] [name] sole basis for treatment. Nasal washings and aspirates are unacceptable for Xpert Xpress SARS-CoV-2/FLU/RSV testing.  Fact Sheet for Patients: EntrepreneurPulse.com.au  Fact Sheet for Healthcare Providers: IncredibleEmployment.be  This test is not yet approved or cleared by the [place] FDA and has been authorized for detection and/or diagnosis of SARS-CoV-2 by FDA under an Emergency Use Authorization (EUA). This EUA will remain in effect (meaning this test can be used) for the duration of the COVID-19 declaration under Section 564(b)(1) of the Act, 21 U.S.C. section 360bbb-3(b)(1), unless the authorization is terminated or revoked.  Performed at [facility], [address] [place], [place] [address]   Blood culture (single)     Status: None   Collection Time: 11/17/20  4:43 PM   Specimen: BLOOD  Result Value Ref Range Status   Specimen Description BLOOD RIGHT HAND  Final   Special Requests   Final    BOTTLES DRAWN AEROBIC AND ANAEROBIC Blood Culture results [name] [name] not be optimal due to an inadequate volume of blood received in culture bottles   Culture   Final    NO GROWTH 5 DAYS Performed at [facility], [address] [place], [place] [address]    Report Status 11/22/2020 FINAL  Final         Radiology Studies: [facility] Chest Port 1  View  Result Date: 11/23/2020 CLINICAL DATA:  Hypoxia.  Altered mental status EXAM: PORTABLE CHEST 1 VIEW COMPARISON:  11/21/2020 FINDINGS: Cardiac enlargement. Negative for heart failure or edema. Mild left lower lobe airspace disease with slight progression. Possible small left effusion. Right lung clear IMPRESSION: Mild left lower lobe atelectasis infiltrate with slight progression. Electronically Signed   By: [name] [name] M.D.   On: 11/23/2020 13:12        Scheduled Meds:  albuterol  2.5 mg Nebulization Q6H   apixaban  5 mg Oral BID   bisacodyl  5 mg Oral Daily   DULoxetine  30 mg Oral BID   fenofibrate  160 mg Oral Daily   folic acid  1 mg Oral Daily   influenza vaccine adjuvanted  0.5 mL Intramuscular Once   irbesartan  150 mg Oral Daily   lactulose  20 g Oral Daily   metoprolol succinate  100 mg Oral Daily   mirtazapine  15 mg Oral QHS   multivitamin with minerals  1 tablet Oral Daily   senna-docusate  2 tablet Oral BID   simvastatin  40 mg Oral QHS   Continuous Infusions:  sodium chloride 10 mL/hr (11/18/20 1818)   sodium chloride 100 mL/hr at 11/24/20 1021     LOS: 7 days    Time spent: over 30 min    Fayrene Helper, MD [facility]   To contact the attending provider between 7A-7P or the covering provider during after hours 7P-7A, please log into the web site [email or web address] and access using universal [place] password for that web site. If you do not have the password,  please call the hospital operator.  11/24/2020, 2:49 PM

## 2020-11-25 ENCOUNTER — Encounter: Payer: Self-pay | Admitting: Internal Medicine

## 2020-11-25 ENCOUNTER — Inpatient Hospital Stay: Payer: Medicare HMO

## 2020-11-25 DIAGNOSIS — F331 Major depressive disorder, recurrent, moderate: Secondary | ICD-10-CM | POA: Diagnosis not present

## 2020-11-25 LAB — CBC WITH DIFFERENTIAL/PLATELET
Abs Immature Granulocytes: 0.04 10*3/uL (ref 0.00–0.07)
Basophils Absolute: 0 10*3/uL (ref 0.0–0.1)
Basophils Relative: 1 %
Eosinophils Absolute: 0.1 10*3/uL (ref 0.0–0.5)
Eosinophils Relative: 3 %
HCT: 35.5 % — ABNORMAL LOW (ref 39.0–52.0)
Hemoglobin: 11.8 g/dL — ABNORMAL LOW (ref 13.0–17.0)
Immature Granulocytes: 1 %
Lymphocytes Relative: 37 %
Lymphs Abs: 1.3 10*3/uL (ref 0.7–4.0)
MCH: 33.4 pg (ref 26.0–34.0)
MCHC: 33.2 g/dL (ref 30.0–36.0)
MCV: 100.6 fL — ABNORMAL HIGH (ref 80.0–100.0)
Monocytes Absolute: 0.4 10*3/uL (ref 0.1–1.0)
Monocytes Relative: 11 %
Neutro Abs: 1.7 10*3/uL (ref 1.7–7.7)
Neutrophils Relative %: 47 %
Platelets: 127 10*3/uL — ABNORMAL LOW (ref 150–400)
RBC: 3.53 MIL/uL — ABNORMAL LOW (ref 4.22–5.81)
RDW: 13 % (ref 11.5–15.5)
WBC: 3.6 10*3/uL — ABNORMAL LOW (ref 4.0–10.5)
nRBC: 0 % (ref 0.0–0.2)

## 2020-11-25 LAB — COMPREHENSIVE METABOLIC PANEL
ALT: 17 U/L (ref 0–44)
AST: 18 U/L (ref 15–41)
Albumin: 2.6 g/dL — ABNORMAL LOW (ref 3.5–5.0)
Alkaline Phosphatase: 23 U/L — ABNORMAL LOW (ref 38–126)
Anion gap: 5 (ref 5–15)
BUN: 18 mg/dL (ref 8–23)
CO2: 29 mmol/L (ref 22–32)
Calcium: 10.8 mg/dL — ABNORMAL HIGH (ref 8.9–10.3)
Chloride: 105 mmol/L (ref 98–111)
Creatinine, Ser: 1.12 mg/dL (ref 0.61–1.24)
GFR, Estimated: >60 mL/min (ref >60)
Glucose, Bld: 133 mg/dL — ABNORMAL HIGH (ref 70–99)
Potassium: 3.7 mmol/L (ref 3.5–5.1)
Sodium: 139 mmol/L (ref 135–145)
Total Bilirubin: 0.8 mg/dL (ref 0.3–1.2)
Total Protein: 5.6 g/dL — ABNORMAL LOW (ref 6.5–8.1)

## 2020-11-25 LAB — MAGNESIUM: Magnesium: 1.5 mg/dL — ABNORMAL LOW (ref 1.7–2.4)

## 2020-11-25 LAB — PHOSPHORUS: Phosphorus: 3.7 mg/dL (ref 2.5–4.6)

## 2020-11-25 MED ORDER — MAGNESIUM SULFATE 4 GM/100ML IV SOLN
4.0000 g | Freq: Once | INTRAVENOUS | Status: AC
  Administered 2020-11-25: 4 g via INTRAVENOUS
  Filled 2020-11-25: qty 100

## 2020-11-25 NOTE — Progress Notes (Signed)
PROGRESS NOTE    [name] Jeffries Scobey Jr.  TWS:[id number] DOB: 1945-07-26 DOA: 11/17/2020 PCP: Albina Billet, MD   Chief Complaint  Patient presents with   Altered Mental Status    Brief Narrative:  75 yr old man with Virdie Penning known history of CAD, PAF, DM II, Peripheral neuropathy, Hypertension, and history of stroke who according to his wife has intermittent episodes of confusion and disorientation at baseline. He has been increasingly short of breath with Tracye Szuch progressively worsening cough for 4 days.  He is admitted for acute hypoxic respiratory failure and acute metabolic encephalopathy   10/30 -possible aspiration event.  Oxygen requirement went up to 4 L.  Giving 40 of Lasix based on the chest x-ray showing pulmonary vascular congestion. 10/31: Weaned off to 2.5 L oxygen.  Pulmonary seen transferred to any MedSurg giving additional 40 mg of Lasix today PT, OT consult 11/1: PT,OT recommends SNF, palliative care consult  Assessment & Plan:   Principal Problem:   Depression Active Problems:   Peripheral neuropathy   Diabetes ([place])   Hyperlipidemia   Toxic metabolic encephalopathy   Coronary artery disease   Hypertension   Acute bronchitis due to infection   Community acquired pneumonia   Acute respiratory failure with hypoxia and hypercapnia (HCC)   Goals of care, counseling/discussion  Acute respiratory failure with hypoxia and hypercapnia (HCC) RA to 2 L, wean as tolerated CXR with mild LLL atelectasis/infiltrate with slight progression, possible small L effusion Being treated for pneumonia (abx completed) and heart failure with lasix (currently on hold)  Community acquired pneumonia Ceftriaxone/azithro 10/29 - 11/2 Pulmonary consult - recommened RVP (not collected), fungitell (negative), legionella ab (pending), urine strep negative, histoplasma urine ag, sputum cx - pending collection, afb expectorated specimen (discussed with pulm who did not recommend isolation) Consider influenza  vaccine prior to d/c   HFpEF exacerbation Edema improving Continue lasix as tolerated (on hold for now)  Hypercalcemia Not overtly symptomatic at this time - gradually worsening over past few days Give gentle IVF and follow - improving with IVF - corrects to 11.9 Nonparathyroid hypercalcemia Vit D 33.15 Follow 1, 25 vit D Follow PTHrp Stop vitamin D  Toxic metabolic encephalopathy Likely related to resp failure and infection Ammonia improved on repeat B12 elevated, folate wnl, TSH wnl VBG without hypercarbia Delirium precautions - seems improved  Cognitive Deficit  Hallucinations Concerning for dementia Needs outpatient neurology  Essential Tremor Needs neuro follow outpt  Suicidal Ideation Psych c/s, appreciate assistance - started mirtazepine, doesn't meet criteria for inpatient psych admission  Deconditioning PT/OT recommending SNF  Goals of care, counseling/discussion Palliative care consult.  Patient is DNR.  Appreciate palliative care.   Hypertension Metoprolol irbesartan   Coronary artery disease Continue statin and Eliquis (will need to review why eliquis?)   metoprolol    Hyperlipidemia Continue fenofibrate   Diabetes (HCC) Hemoglobin A1c of 6.  Blood sugars seem to be fairly well controlled   Peripheral neuropathy On Cymbalta  DVT prophylaxis: eliquis Code Status:DNR Family Communication: wife at bedside Disposition:   Status is: Inpatient  Remains inpatient appropriate because: continued acute hypoxic resp failure       Consultants:  pulmonology  Procedures: Echo IMPRESSIONS     1. Left ventricular ejection fraction, by estimation, is 60 to 65%. The  left ventricle has normal function. The left ventricle has no regional  wall motion abnormalities. Left ventricular diastolic parameters were  normal.   2. Right ventricular systolic function is normal. The right  ventricular  size is normal. There is mildly elevated pulmonary  artery systolic  pressure.   3. The mitral valve is normal in structure. Trivial mitral valve  regurgitation.   4. The aortic valve is normal in structure. Aortic valve regurgitation is  not visualized.   Antimicrobials:  Anti-infectives (From admission, onward)    Start     Dose/Rate Route Frequency Ordered Stop   11/17/20 1630  cefTRIAXone (ROCEPHIN) 2 g in sodium chloride 0.9 % 100 mL IVPB        2 g 200 mL/hr over 30 Minutes Intravenous Every 24 hours 11/17/20 1624 11/21/20 1751   11/17/20 1630  azithromycin (ZITHROMAX) 500 mg in sodium chloride 0.9 % 250 mL IVPB        500 mg 250 mL/hr over 60 Minutes Intravenous Every 24 hours 11/17/20 1624 11/21/20 1942          Subjective: No complaints today, feeling Sahra Converse little better  Objective: Vitals:   11/25/20 0511 11/25/20 0715 11/25/20 1313 11/25/20 1327  BP: (!) 178/102 (!) 178/99 (!) 156/70   Pulse: 68 69 (!) 55   Resp: 18 19 18    Temp: (!) 97.4 F (36.3 C) 97.7 F (36.5 C)    TempSrc: Oral Oral    SpO2: 99% 98% 99% 96%  Weight:      Height:        Intake/Output Summary (Last 24 hours) at 11/25/2020 1335 Last data filed at 11/25/2020 1152 Gross per 24 hour  Intake 1347.39 ml  Output 2800 ml  Net -1452.61 ml   Filed Weights   11/17/20 1119  Weight: 108.9 kg    Examination:  General: No acute distress. Cardiovascular: RRR Lungs: Clear to auscultation bilaterally  Abdomen: Soft, nontender, nondistended  Neurological: Alert and oriented 3. Moves all extremities 4 . Cranial nerves II through XII grossly intact. Skin: Warm and dry. No rashes or lesions. Extremities: No clubbing or cyanosis. No edema.    Data Reviewed: I have personally reviewed following labs and imaging studies  CBC: Recent Labs  Lab 11/21/20 0629 11/22/20 0436 11/23/20 0644 11/24/20 0413 11/25/20 0407  WBC 6.4 5.4 3.9* 4.2 3.6*  NEUTROABS  --  2.8 1.8 2.2 1.7  HGB 13.6 13.9 12.7* 12.4* 11.8*  HCT 39.1 42.2 37.9* 37.4* 35.5*   MCV 99.5 99.8 99.0 100.5* 100.6*  PLT 171 165 158 133* 127*    Basic Metabolic Panel: Recent Labs  Lab 11/21/20 0629 11/22/20 0436 11/22/20 0850 11/22/20 1646 11/23/20 0644 11/23/20 1534 11/24/20 0413 11/25/20 0407  NA 139 140  --  139 139 139 138 139  K 4.5 4.3  --  4.0 3.9 4.1 3.8 3.7  CL 99 94*  --  94* 98 96* 101 105  CO2 34* 39*  --  34* 34* 35* 32 29  GLUCOSE 153* 144*  --  144* 119* 157* 145* 133*  BUN 14 17  --  20 21 22 20 18   CREATININE 0.76 1.14  --  1.22 1.22 1.41* 1.37* 1.12  CALCIUM 10.9* 11.4*   < > 12.0* 11.2* 11.4* 11.1* 10.8*  MG 1.5* 1.6*  --   --  1.5*  --  1.9 1.5*  PHOS  --  4.2  --   --  4.0  --  3.4 3.7   < > = values in this interval not displayed.    GFR: Estimated Creatinine Clearance: 70.3 mL/min (by C-G formula based on SCr of 1.12 mg/dL).  Liver Function Tests: Recent  Labs  Lab 11/22/20 0436 11/22/20 0850 11/22/20 1646 11/23/20 0644 11/23/20 1534 11/24/20 0413 11/25/20 0407  AST 24  --   --  19  --  19 18  ALT 21  --   --  17  --  16 17  ALKPHOS 30*  --   --  27*  --  27* 23*  BILITOT 0.6  --   --  0.7  --  0.7 0.8  PROT 7.0  --   --  6.3*  --  5.9* 5.6*  ALBUMIN 3.3*   < > 3.5 2.9* 2.9* 2.8* 2.6*   < > = values in this interval not displayed.    CBG: Recent Labs  Lab 11/24/20 1706  GLUCAP 156*     Recent Results (from the past 240 hour(s))  Urine Culture     Status: Abnormal   Collection Time: 11/17/20 12:31 PM   Specimen: In/Out Cath Urine  Result Value Ref Range Status   Specimen Description   Final    IN/OUT CATH URINE Performed at [facility], [address], [place], [place] [address]    Special Requests   Final    NONE Performed at [facility], [address] [place], [place] [address]    Culture (Kenyonna Micek)  Final    20,000 COLONIES/mL AEROCOCCUS SPECIES Standardized susceptibility testing for this organism is not available. Performed at [facility], [place] [address]  [place], [place] [address]    Report Status 11/22/2020 FINAL  Final  Blood culture (routine single)     Status: None   Collection Time: 11/17/20  3:42 PM   Specimen: BLOOD  Result Value Ref Range Status   Specimen Description BLOOD RIGHT ANTECUBITAL  Final   Special Requests   Final    BOTTLES DRAWN AEROBIC AND ANAEROBIC Blood Culture adequate volume   Culture   Final    NO GROWTH 5 DAYS Performed at [facility], [address] [place], [place] [address]    Report Status 11/22/2020 FINAL  Final  Resp Panel by RT-PCR (Flu Giara Mcgaughey&B, Covid) Nasopharyngeal Swab     Status: None   Collection Time: 11/17/20  3:56 PM   Specimen: Nasopharyngeal Swab; Nasopharyngeal(NP) swabs in vial transport medium  Result Value Ref Range Status   SARS Coronavirus 2 by RT PCR NEGATIVE NEGATIVE Final    Comment: (NOTE) SARS-CoV-2 target nucleic acids are NOT DETECTED.  The SARS-CoV-2 RNA is generally detectable in upper respiratory specimens during the acute phase of infection. The lowest concentration of SARS-CoV-2 viral copies this assay can detect is 138 copies/mL. Shirlette Scarber negative result does not preclude SARS-Cov-2 infection and should not be used as the sole basis for treatment or other patient management decisions. Alysiana Ethridge negative result [place] occur with  improper specimen collection/handling, submission of specimen other than nasopharyngeal swab, presence of viral mutation(s) within the areas targeted by this assay, and inadequate number of viral copies(<138 copies/mL). Waver Dibiasio negative result must be combined with clinical observations, patient history, and epidemiological information. The expected result is Negative.  Fact Sheet for Patients:  EntrepreneurPulse.com.au  Fact Sheet for Healthcare Providers:  IncredibleEmployment.be  This test is no t yet approved or cleared by the [place] FDA and  has been authorized for detection and/or diagnosis of SARS-CoV-2  by FDA under an Emergency Use Authorization (EUA). This EUA will remain  in effect (meaning this test can be used) for the duration of the COVID-19 declaration under Section 564(b)(1) of  the Act, 21 U.S.C.section 360bbb-3(b)(1), unless the authorization is terminated  or revoked sooner.       Influenza Jamareon Shimel by PCR NEGATIVE NEGATIVE Final   Influenza B by PCR NEGATIVE NEGATIVE Final    Comment: (NOTE) The Xpert Xpress SARS-CoV-2/FLU/RSV plus assay is intended as an aid in the diagnosis of influenza from Nasopharyngeal swab specimens and should not be used as Hosey Burmester sole basis for treatment. Nasal washings and aspirates are unacceptable for Xpert Xpress SARS-CoV-2/FLU/RSV testing.  Fact Sheet for Patients: EntrepreneurPulse.com.au  Fact Sheet for Healthcare Providers: IncredibleEmployment.be  This test is not yet approved or cleared by the [place] FDA and has been authorized for detection and/or diagnosis of SARS-CoV-2 by FDA under an Emergency Use Authorization (EUA). This EUA will remain in effect (meaning this test can be used) for the duration of the COVID-19 declaration under Section 564(b)(1) of the Act, 21 U.S.C. section 360bbb-3(b)(1), unless the authorization is terminated or revoked.  Performed at [facility], [address] [place], [place] [address]   Blood culture (single)     Status: None   Collection Time: 11/17/20  4:43 PM   Specimen: BLOOD  Result Value Ref Range Status   Specimen Description BLOOD RIGHT HAND  Final   Special Requests   Final    BOTTLES DRAWN AEROBIC AND ANAEROBIC Blood Culture results Dikes not be optimal due to an inadequate volume of blood received in culture bottles   Culture   Final    NO GROWTH 5 DAYS Performed at [facility], [address], [place], [place] [address]    Report Status 11/22/2020 FINAL  Final         Radiology Studies: No results  found.      Scheduled Meds:  albuterol  2.5 mg Nebulization Q6H   apixaban  5 mg Oral BID   bisacodyl  5 mg Oral Daily   DULoxetine  30 mg Oral BID   fenofibrate  160 mg Oral Daily   folic acid  1 mg Oral Daily   influenza vaccine adjuvanted  0.5 mL Intramuscular Once   irbesartan  150 mg Oral Daily   lactulose  20 g Oral Daily   metoprolol succinate  100 mg Oral Daily   mirtazapine  15 mg Oral QHS   multivitamin with minerals  1 tablet Oral Daily   senna-docusate  2 tablet Oral BID   simvastatin  40 mg Oral QHS   Continuous Infusions:  sodium chloride 10 mL/hr (11/18/20 1818)     LOS: 8 days    Time spent: over 30 min    Fayrene Helper, MD [facility]   To contact the attending provider between 7A-7P or the covering provider during after hours 7P-7A, please log into the web site [email or web address] and access using universal [place] password for that web site. If you do not have the password, please call the hospital operator.  11/25/2020, 1:35 PM

## 2020-11-25 NOTE — Progress Notes (Signed)
Patient was up to the bsc without O2.  O2 dropped to 87%.  2L o2 per [place] reapplied.  Dr. Florene Glen made aware.

## 2020-11-26 ENCOUNTER — Inpatient Hospital Stay: Payer: Medicare HMO

## 2020-11-26 DIAGNOSIS — F331 Major depressive disorder, recurrent, moderate: Secondary | ICD-10-CM | POA: Diagnosis not present

## 2020-11-26 LAB — MAGNESIUM: Magnesium: 1.7 mg/dL (ref 1.7–2.4)

## 2020-11-26 LAB — CBC WITH DIFFERENTIAL/PLATELET
Abs Immature Granulocytes: 0.05 10*3/uL (ref 0.00–0.07)
Basophils Absolute: 0 10*3/uL (ref 0.0–0.1)
Basophils Relative: 1 %
Eosinophils Absolute: 0.1 10*3/uL (ref 0.0–0.5)
Eosinophils Relative: 2 %
HCT: 36.7 % — ABNORMAL LOW (ref 39.0–52.0)
Hemoglobin: 12.3 g/dL — ABNORMAL LOW (ref 13.0–17.0)
Immature Granulocytes: 1 %
Lymphocytes Relative: 32 %
Lymphs Abs: 1.6 10*3/uL (ref 0.7–4.0)
MCH: 33.5 pg (ref 26.0–34.0)
MCHC: 33.5 g/dL (ref 30.0–36.0)
MCV: 100 fL (ref 80.0–100.0)
Monocytes Absolute: 0.5 10*3/uL (ref 0.1–1.0)
Monocytes Relative: 10 %
Neutro Abs: 2.6 10*3/uL (ref 1.7–7.7)
Neutrophils Relative %: 54 %
Platelets: 137 10*3/uL — ABNORMAL LOW (ref 150–400)
RBC: 3.67 MIL/uL — ABNORMAL LOW (ref 4.22–5.81)
RDW: 12.9 % (ref 11.5–15.5)
WBC: 4.9 10*3/uL (ref 4.0–10.5)
nRBC: 0 % (ref 0.0–0.2)

## 2020-11-26 LAB — GLUCOSE, CAPILLARY: Glucose-Capillary: 160 mg/dL — ABNORMAL HIGH (ref 70–99)

## 2020-11-26 LAB — COMPREHENSIVE METABOLIC PANEL
ALT: 18 U/L (ref 0–44)
AST: 22 U/L (ref 15–41)
Albumin: 2.9 g/dL — ABNORMAL LOW (ref 3.5–5.0)
Alkaline Phosphatase: 26 U/L — ABNORMAL LOW (ref 38–126)
Anion gap: 5 (ref 5–15)
BUN: 20 mg/dL (ref 8–23)
CO2: 30 mmol/L (ref 22–32)
Calcium: 12.1 mg/dL — ABNORMAL HIGH (ref 8.9–10.3)
Chloride: 105 mmol/L (ref 98–111)
Creatinine, Ser: 1.21 mg/dL (ref 0.61–1.24)
GFR, Estimated: >60 mL/min (ref >60)
Glucose, Bld: 131 mg/dL — ABNORMAL HIGH (ref 70–99)
Potassium: 3.9 mmol/L (ref 3.5–5.1)
Sodium: 140 mmol/L (ref 135–145)
Total Bilirubin: 0.6 mg/dL (ref 0.3–1.2)
Total Protein: 6.4 g/dL — ABNORMAL LOW (ref 6.5–8.1)

## 2020-11-26 LAB — CALCITRIOL (1,25 DI-OH VIT D): Vit D, 1,25-Dihydroxy: 11 pg/mL — ABNORMAL LOW (ref 24.8–81.5)

## 2020-11-26 LAB — PHOSPHORUS: Phosphorus: 4 mg/dL (ref 2.5–4.6)

## 2020-11-26 NOTE — Progress Notes (Signed)
PROGRESS NOTE    [name] Macpherson Hudlow Jr.  XTG:[id number] DOB: October 29, 1945 DOA: 11/17/2020 PCP: [name] Billet, MD   Chief Complaint  Patient presents with   Altered Mental Status    Brief Narrative:  75 yr old man with [name] [name] known history of CAD, PAF, DM II, Peripheral neuropathy, Hypertension, and history of stroke who according to his wife has intermittent episodes of confusion and disorientation at baseline. He has been increasingly short of breath with [name] [name] progressively worsening cough for 4 days.  He is admitted for acute hypoxic respiratory failure and acute metabolic encephalopathy   10/30 -possible aspiration event.  Oxygen requirement went up to 4 L.  Giving 40 of Lasix based on the chest x-ray showing pulmonary vascular congestion. 10/31: Weaned off to 2.5 L oxygen.  Pulmonary seen transferred to any MedSurg giving additional 40 mg of Lasix today PT, OT consult 11/1: PT,OT recommends SNF, palliative care consult  Assessment & Plan:   Principal Problem:   Depression Active Problems:   Peripheral neuropathy   Diabetes ([place])   Hyperlipidemia   Toxic metabolic encephalopathy   Coronary artery disease   Hypertension   Acute bronchitis due to infection   Community acquired pneumonia   Acute respiratory failure with hypoxia and hypercapnia (HCC)   Goals of care, counseling/discussion  Acute respiratory failure with hypoxia and hypercapnia (HCC) RA to 2 L, wean as tolerated CXR with mild LLL atelectasis/infiltrate with slight progression, possible small L effusion Being treated for pneumonia (abx completed) and heart failure with lasix (currently on hold) Given inability to wean to RA, will follow CT chest given previously on RA PTA  Community acquired pneumonia Ceftriaxone/azithro 10/29 - 11/2 Pulmonary consult - recommened RVP (not collected), fungitell (negative), legionella ab (pending), urine strep negative, histoplasma urine ag, sputum cx - pending collection, afb expectorated  specimen (discussed with pulm who did not recommend isolation) Consider influenza vaccine prior to d/c   HFpEF exacerbation Edema improving Continue lasix as tolerated (on hold for now)  Hypercalcemia Not overtly symptomatic at this time - gradually worsening over past few days Give gentle IVF and follow - worse off IVF, does not seem symptomatic, consider dose of bisphosphonate given worsening after initial improvement off IVF Nonparathyroid hypercalcemia Vit D 33.15 Follow 1, 25 vit D Follow PTHrp Stop vitamin D  Toxic metabolic encephalopathy Likely related to resp failure and infection Ammonia improved on repeat B12 elevated, folate wnl, TSH wnl VBG without hypercarbia Delirium precautions - seems improved  Cognitive Deficit  Hallucinations Concerning for dementia Needs outpatient neurology  Essential Tremor Needs neuro follow outpt  Suicidal Ideation Psych c/s, appreciate assistance - started mirtazepine, doesn't meet criteria for inpatient psych admission  Deconditioning PT/OT recommending SNF  Goals of care, counseling/discussion Palliative care consult.  Patient is DNR.  Appreciate palliative care.   Hypertension Metoprolol irbesartan   Coronary artery disease Continue statin and Eliquis (will need to review why eliquis?)   metoprolol    Hyperlipidemia Continue fenofibrate   Diabetes (HCC) Hemoglobin A1c of 6.  Blood sugars seem to be fairly well controlled   Peripheral neuropathy On Cymbalta  DVT prophylaxis: eliquis Code Status:DNR Family Communication: wife at bedside Disposition:   Status is: Inpatient  Remains inpatient appropriate because: continued acute hypoxic resp failure       Consultants:  pulmonology  Procedures: Echo IMPRESSIONS     1. Left ventricular ejection fraction, by estimation, is 60 to 65%. The  left ventricle has normal function. The left  ventricle has no regional  wall motion abnormalities. Left  ventricular diastolic parameters were  normal.   2. Right ventricular systolic function is normal. The right ventricular  size is normal. There is mildly elevated pulmonary artery systolic  pressure.   3. The mitral valve is normal in structure. Trivial mitral valve  regurgitation.   4. The aortic valve is normal in structure. Aortic valve regurgitation is  not visualized.   Antimicrobials:  Anti-infectives (From admission, onward)    Start     Dose/Rate Route Frequency Ordered Stop   11/17/20 1630  cefTRIAXone (ROCEPHIN) 2 g in sodium chloride 0.9 % 100 mL IVPB        2 g 200 mL/hr over 30 Minutes Intravenous Every 24 hours 11/17/20 1624 11/21/20 1751   11/17/20 1630  azithromycin (ZITHROMAX) 500 mg in sodium chloride 0.9 % 250 mL IVPB        500 mg 250 mL/hr over 60 Minutes Intravenous Every 24 hours 11/17/20 1624 11/21/20 1942          Subjective: Delirium overnight, seems improved now, follow  Objective: Vitals:   11/26/20 0832 11/26/20 0905 11/26/20 1148 11/26/20 1429  BP: (!) 147/86  (!) 165/84   Pulse: 65  68   Resp: 16  20   Temp: 97.6 F (36.4 C)  98 F (36.7 C)   TempSrc: Oral     SpO2: 98% 95% 99% 96%  Weight:      Height:        Intake/Output Summary (Last 24 hours) at 11/26/2020 1443 Last data filed at 11/26/2020 1150 Gross per 24 hour  Intake 240 ml  Output 1925 ml  Net -1685 ml   Filed Weights   11/17/20 1119  Weight: 108.9 kg    Examination:  General: No acute distress. Cardiovascular: Heart sounds show [name] [name] regular rate, and rhythm.  Lungs: Clear to auscultation bilaterally  Abdomen: Soft, nontender, nondistended  Neurological: Alert and oriented 3. Moves all extremities 4 . Cranial nerves II through XII grossly intact. Skin: Warm and dry. No rashes or lesions. Extremities: No clubbing or cyanosis. No edema.  Data Reviewed: I have personally reviewed following labs and imaging studies  CBC: Recent Labs  Lab 11/22/20 0436  11/23/20 0644 11/24/20 0413 11/25/20 0407 11/26/20 0400  WBC 5.4 3.9* 4.2 3.6* 4.9  NEUTROABS 2.8 1.8 2.2 1.7 2.6  HGB 13.9 12.7* 12.4* 11.8* 12.3*  HCT 42.2 37.9* 37.4* 35.5* 36.7*  MCV 99.8 99.0 100.5* 100.6* 100.0  PLT 165 158 133* 127* 137*    Basic Metabolic Panel: Recent Labs  Lab 11/22/20 0436 11/22/20 0850 11/23/20 0644 11/23/20 1534 11/24/20 0413 11/25/20 0407 11/26/20 0400  NA 140   < > 139 139 138 139 140  K 4.3   < > 3.9 4.1 3.8 3.7 3.9  CL 94*   < > 98 96* 101 105 105  CO2 39*   < > 34* 35* 32 29 30  GLUCOSE 144*   < > 119* 157* 145* 133* 131*  BUN 17   < > 21 22 20 18 20   CREATININE 1.14   < > 1.22 1.41* 1.37* 1.12 1.21  CALCIUM 11.4*   < > 11.2* 11.4* 11.1* 10.8* 12.1*  MG 1.6*  --  1.5*  --  1.9 1.5* 1.7  PHOS 4.2  --  4.0  --  3.4 3.7 4.0   < > = values in this interval not displayed.    GFR: Estimated Creatinine Clearance: 65.1  mL/min (by C-G formula based on SCr of 1.21 mg/dL).  Liver Function Tests: Recent Labs  Lab 11/22/20 0436 11/22/20 0850 11/23/20 0644 11/23/20 1534 11/24/20 0413 11/25/20 0407 11/26/20 0400  AST 24  --  19  --  19 18 22   ALT 21  --  17  --  16 17 18   ALKPHOS 30*  --  27*  --  27* 23* 26*  BILITOT 0.6  --  0.7  --  0.7 0.8 0.6  PROT 7.0  --  6.3*  --  5.9* 5.6* 6.4*  ALBUMIN 3.3*   < > 2.9* 2.9* 2.8* 2.6* 2.9*   < > = values in this interval not displayed.    CBG: Recent Labs  Lab 11/24/20 1706 11/26/20 0833  GLUCAP 156* 160*     Recent Results (from the past 240 hour(s))  Urine Culture     Status: Abnormal   Collection Time: 11/17/20 12:31 PM   Specimen: In/Out Cath Urine  Result Value Ref Range Status   Specimen Description   Final    IN/OUT CATH URINE Performed at [facility], [address] [place], [place] [address]    Special Requests   Final    NONE Performed at [facility], [address] [place], [place] [address]    Culture (Norvella Loscalzo)  Final    20,000 COLONIES/mL  AEROCOCCUS SPECIES Standardized susceptibility testing for this organism is not available. Performed at [facility], [place] [address], [place], [place] [address]    Report Status 11/22/2020 FINAL  Final  Blood culture (routine single)     Status: None   Collection Time: 11/17/20  3:42 PM   Specimen: BLOOD  Result Value Ref Range Status   Specimen Description BLOOD RIGHT ANTECUBITAL  Final   Special Requests   Final    BOTTLES DRAWN AEROBIC AND ANAEROBIC Blood Culture adequate volume   Culture   Final    NO GROWTH 5 DAYS Performed at [facility], [address] [place], [place] [address]    Report Status 11/22/2020 FINAL  Final  Resp Panel by RT-PCR (Flu Elena Davia&B, Covid) Nasopharyngeal Swab     Status: None   Collection Time: 11/17/20  3:56 PM   Specimen: Nasopharyngeal Swab; Nasopharyngeal(NP) swabs in vial transport medium  Result Value Ref Range Status   SARS Coronavirus 2 by RT PCR NEGATIVE NEGATIVE Final    Comment: (NOTE) SARS-CoV-2 target nucleic acids are NOT DETECTED.  The SARS-CoV-2 RNA is generally detectable in upper respiratory specimens during the acute phase of infection. The lowest concentration of SARS-CoV-2 viral copies this assay can detect is 138 copies/mL. Tywan Siever negative result does not preclude SARS-Cov-2 infection and should not be used as the sole basis for treatment or other patient management decisions. Analiah Drum negative result [place] occur with  improper specimen collection/handling, submission of specimen other than nasopharyngeal swab, presence of viral mutation(s) within the areas targeted by this assay, and inadequate number of viral copies(<138 copies/mL). Odessia Asleson negative result must be combined with clinical observations, patient history, and epidemiological information. The expected result is Negative.  Fact Sheet for Patients:  EntrepreneurPulse.com.au  Fact Sheet for Healthcare Providers:   IncredibleEmployment.be  This test is no t yet approved or cleared by the [place] FDA and  has been authorized for detection and/or diagnosis of SARS-CoV-2 by FDA under an Emergency Use Authorization (EUA). This EUA will remain  in effect (meaning this test can be used) for the duration  of the COVID-19 declaration under Section 564(b)(1) of the Act, 21 U.S.C.section 360bbb-3(b)(1), unless the authorization is terminated  or revoked sooner.       Influenza Laurencia Roma by PCR NEGATIVE NEGATIVE Final   Influenza B by PCR NEGATIVE NEGATIVE Final    Comment: (NOTE) The Xpert Xpress SARS-CoV-2/FLU/RSV plus assay is intended as an aid in the diagnosis of influenza from Nasopharyngeal swab specimens and should not be used as Yunique Dearcos sole basis for treatment. Nasal washings and aspirates are unacceptable for Xpert Xpress SARS-CoV-2/FLU/RSV testing.  Fact Sheet for Patients: EntrepreneurPulse.com.au  Fact Sheet for Healthcare Providers: IncredibleEmployment.be  This test is not yet approved or cleared by the [place] FDA and has been authorized for detection and/or diagnosis of SARS-CoV-2 by FDA under an Emergency Use Authorization (EUA). This EUA will remain in effect (meaning this test can be used) for the duration of the COVID-19 declaration under Section 564(b)(1) of the Act, 21 U.S.C. section 360bbb-3(b)(1), unless the authorization is terminated or revoked.  Performed at [facility], [address] [place], [place] [address]   Blood culture (single)     Status: None   Collection Time: 11/17/20  4:43 PM   Specimen: BLOOD  Result Value Ref Range Status   Specimen Description BLOOD RIGHT HAND  Final   Special Requests   Final    BOTTLES DRAWN AEROBIC AND ANAEROBIC Blood Culture results [place] not be optimal due to an inadequate volume of blood received in culture bottles   Culture   Final    NO GROWTH 5 DAYS Performed  at [facility] [address] [place], [place] [address]    Report Status 11/22/2020 FINAL  Final         Radiology Studies: [facility] Chest Port 1 View  Result Date: 11/25/2020 CLINICAL DATA:  Hypoxia EXAM: PORTABLE CHEST 1 VIEW COMPARISON:  11/23/2020 FINDINGS: Cardiac shadow is stable. Aortic calcifications are again seen. The lungs are well aerated bilaterally. No focal infiltrate or sizable effusion is noted. Previously seen left basilar atelectasis has resolved. No bony abnormality is noted. IMPRESSION: No active disease. Electronically Signed   By: Inez Catalina M.D.   On: 11/25/2020 21:38        Scheduled Meds:  albuterol  2.5 mg Nebulization Q6H   apixaban  5 mg Oral BID   bisacodyl  5 mg Oral Daily   DULoxetine  30 mg Oral BID   fenofibrate  160 mg Oral Daily   folic acid  1 mg Oral Daily   influenza vaccine adjuvanted  0.5 mL Intramuscular Once   irbesartan  150 mg Oral Daily   lactulose  20 g Oral Daily   metoprolol succinate  100 mg Oral Daily   mirtazapine  15 mg Oral QHS   multivitamin with minerals  1 tablet Oral Daily   senna-docusate  2 tablet Oral BID   simvastatin  40 mg Oral QHS   Continuous Infusions:  sodium chloride 10 mL/hr (11/18/20 1818)     LOS: 9 days    Time spent: over 30 min    Fayrene Helper, MD [facility]   To contact the attending provider between 7A-7P or the covering provider during after hours 7P-7A, please log into the web site [email or web address] and access using universal [place] password for that web site. If you do not have the password, please call the hospital operator.  11/26/2020, 2:43 PM

## 2020-11-26 NOTE — Progress Notes (Signed)
Physical Therapy Treatment Patient Details Name: [name] Valliant Masini Jr. MRN: [id number] DOB: 09-05-1945 Today's Date: 11/26/2020   History of Present Illness Pt is 75 yr old man with a known history of CAD, PAF, DM II, Peripheral neuropathy, hypertension, and history of stroke who according to his wife has intermittent episodes of confusion and disorientation at baseline. Pt presented to [facility] with increased short of breath with a progressively worsening cough for 4 days. He was admitted for acute hypoxic respiratory failure and acute metabolic encephalopathy    PT Comments    Pt asleep, spouse [name] [name]) present indicating pt has not been sleeping well. Pt does arouse with consistent verbal and tactile cues, but remains fatigued during session. Progressed treatment to amb a few steps w/ CGA, RW but pt fatigues quickly w/ increased coughing (no sputum present) and requested going back to bed. Pt is however, motivated to continue walking next treatment session. Vitals monitored throughout session with SpO2 > 95% on 2L [place]. SNF remains primary discharge recommendation at this point to return to PLOF and improve endurance for functional mobility as able. Skilled PT intervention is indicated to address deficits in function, mobility, and to return to PLOF as able.     Recommendations for follow up therapy are one component of a multi-disciplinary discharge planning process, led by the attending physician.  Recommendations Schmader be updated based on patient status, additional functional criteria and insurance authorization.  Follow Up Recommendations  Skilled nursing-short term rehab (<3 hours/day)     Assistance Recommended at Discharge Frequent or constant Supervision/Assistance  Equipment Recommendations  None recommended by PT    Recommendations for Other Services       Precautions / Restrictions Precautions Precautions: Fall Restrictions Weight Bearing Restrictions: No     Mobility  Bed  Mobility Overal bed mobility: Needs Assistance Bed Mobility: Supine to Sit;Sit to Supine     Supine to sit: Mod assist;HOB elevated Sit to supine: Min assist   General bed mobility comments: trunk assistance for sitting up w/ HOB elevated, able to manage LE indep w/ cues    Transfers Overall transfer level: Needs assistance Equipment used: Rolling walker (2 wheels) Transfers: Sit to/from Stand Sit to Stand: Min assist           General transfer comment: Min-A for holding RW and slight assist for momentum    Ambulation/Gait Ambulation/Gait assistance: Min guard Gait Distance (Feet): 3 Feet Assistive device: Rolling walker (2 wheels) Gait Pattern/deviations: Step-to pattern;Decreased step length - right;Decreased step length - left Gait velocity: decreased     General Gait Details: Pt noted extreme fatigue, but able to move forward, backwards and sideways without LOB   Stairs             Wheelchair Mobility    Modified Rankin (Stroke Patients Only)       Balance Overall balance assessment: Needs assistance Sitting-balance support: Bilateral upper extremity supported;Feet supported Sitting balance-Leahy Scale: Fair Sitting balance - Comments: able to lift UE without LOB and corrects balance w/ cues but does not notice R lateral trunk lean at baseline; mainly requires BUE support from fatigue, lack of endurance Postural control: Right lateral lean Standing balance support: Bilateral upper extremity supported;During functional activity Standing balance-Leahy Scale: Poor Standing balance comment: requires BUE support                            Cognition Arousal/Alertness: Lethargic Behavior During Therapy: [facility] for tasks  assessed/performed Overall Cognitive Status: History of cognitive impairments - at baseline                                 General Comments: lethargic but conversive and participatory during session, orientend x  3        Exercises General Exercises - Lower Extremity Heel Slides: AROM;Both;10 reps;Supine Hip ABduction/ADduction: AROM;10 reps;Both;Supine Straight Leg Raises: AROM;Both;10 reps;Supine    General Comments General comments (skin integrity, edema, etc.): SpO2 97-99% w/ mobility 2L [place], resting HR 71, HRmax: 82 w/ standing      Pertinent Vitals/Pain Pain Assessment: No/denies pain Pain Intervention(s): Monitored during session    Home Living                          Prior Function            PT Goals (current goals can now be found in the care plan section) Progress towards PT goals: Progressing toward goals    Frequency    Min 2X/week      PT Plan Current plan remains appropriate    Co-evaluation              AM-PAC PT "6 Clicks" Mobility   Outcome Measure  Help needed turning from your back to your side while in a flat bed without using bedrails?: A Little Help needed moving from lying on your back to sitting on the side of a flat bed without using bedrails?: A Lot Help needed moving to and from a bed to a chair (including a wheelchair)?: A Little Help needed standing up from a chair using your arms (e.g., wheelchair or bedside chair)?: A Little Help needed to walk in hospital room?: A Lot Help needed climbing 3-5 steps with a railing? : A Lot 6 Click Score: 15    End of Session Equipment Utilized During Treatment: Gait belt;Oxygen Activity Tolerance: Patient limited by fatigue Patient left: in bed;with call bell/phone within reach;with bed alarm set;with family/visitor present   PT Visit Diagnosis: Unsteadiness on feet (R26.81);Muscle weakness (generalized) (M62.81);Difficulty in walking, not elsewhere classified (R26.2)     Time: 0300-9233 PT Time Calculation (min) (ACUTE ONLY): 24 min  Charges:                        [employer], SPT

## 2020-11-26 NOTE — TOC Progression Note (Signed)
Transition of Care (TOC) - Progression Note    Patient Details  Name: [name] Vandenberghe Koper Jr. MRN: [id number] Date of Birth: Oct 01, 1945  Transition of Care [facility]) CM/SW [place], RN Phone Number: 11/26/2020, 2:24 PM  Clinical Narrative:  Text Tammy at [facility] requesting placement for patient no return call. Text Otila Kluver who replies back she is off and will need to discuss with Tammy.     Expected Discharge Plan: [place] Barriers to Discharge: Continued Medical Work up  Expected Discharge Plan and Services Expected Discharge Plan: [place] In-house Referral: Clinical Social Work   Post Acute Care Choice: [place] Living arrangements for the past 2 months: Single Family Home                                       Social Determinants of Health (SDOH) Interventions    Readmission Risk Interventions No flowsheet data found.

## 2020-11-26 NOTE — Progress Notes (Signed)
Occupational Therapy Treatment Patient Details Name: [name] [name]. MRN: [id number] DOB: 26-Apr-1945 Today's Date: 11/26/2020   History of present illness Pt is 75 yr old man with a known history of CAD, PAF, DM II, Peripheral neuropathy, hypertension, and history of stroke who according to his wife has intermittent episodes of confusion and disorientation at baseline. Pt presented to [facility] with increased short of breath with a progressively worsening cough for 4 days. He was admitted for acute hypoxic respiratory failure and acute metabolic encephalopathy   OT comments  Mr. Sandeen continues to make good progress towards his goals; however, continues to experience generalized weakness and limited endurance. A&Ox4, yet displays some level of confusion (e.g., appears perplexed about how to open food containers). Mod A for bed mobility, with multimodal cues required for task sequencing. Displays poor standing balance, with one significant LOB. O2 sats remain in upper 90s throughout session on 2L O2, mild coughing. Continue to follow acutely, with DC to STR.   Recommendations for follow up therapy are one component of a multi-disciplinary discharge planning process, led by the attending physician.  Recommendations [place] be updated based on patient status, additional functional criteria and insurance authorization.    Follow Up Recommendations  Skilled nursing-short term rehab (<3 hours/day)    Assistance Recommended at Discharge Frequent or constant Supervision/Assistance  Equipment Recommendations       Recommendations for Other Services      Precautions / Restrictions Precautions Precautions: Fall Restrictions Weight Bearing Restrictions: No       Mobility Bed Mobility Overal bed mobility: Needs Assistance Bed Mobility: Supine to Sit     Supine to sit: Mod assist;HOB elevated     General bed mobility comments: Required Mod A to get feet free from blankets, Mod A to come into full sitting  position.    Transfers Overall transfer level: Needs assistance Equipment used: Rolling walker (2 wheels) Transfers: Sit to/from Stand Sit to Stand: Mod assist           General transfer comment: Mod A for powering up into standing     Balance Overall balance assessment: Needs assistance Sitting-balance support: Bilateral upper extremity supported;Feet supported Sitting balance-Leahy Scale: Fair     Standing balance support: Bilateral upper extremity supported;During functional activity Standing balance-Leahy Scale: Poor Standing balance comment: heavy reliance on UE support; 1 LOB in standing, with Mod A from therapist to correct                           ADL either performed or assessed with clinical judgement   ADL Overall ADL's : Needs assistance/impaired Eating/Feeding: Set up;Sitting Eating/Feeding Details (indicate cue type and reason): Cues for initiating, sequencing                                        Extremity/Trunk Assessment Upper Extremity Assessment Upper Extremity Assessment: Generalized weakness   Lower Extremity Assessment Lower Extremity Assessment: Generalized weakness        Vision       Perception     Praxis      Cognition Arousal/Alertness: Lethargic Behavior During Therapy: WFL for tasks assessed/performed Overall Cognitive Status: History of cognitive impairments - at baseline  Exercises Other Exercises Other Exercises: Bed mobility, transfers, standing balance tolerance, cognitive re-orientation   Shoulder Instructions       General Comments      Pertinent Vitals/ Pain       Pain Assessment: No/denies pain  Home Living                                          Prior Functioning/Environment              Frequency  Min 1X/week        Progress Toward Goals  OT Goals(current goals can now be found in the  care plan section)  Progress towards OT goals: Progressing toward goals  Acute Rehab OT Goals Patient Stated Goal: to get home OT Goal Formulation: With patient Time For Goal Achievement: 12/03/20 Potential to Achieve Goals: Good  Plan Discharge plan remains appropriate;Frequency remains appropriate    Co-evaluation                 AM-PAC OT "6 Clicks" Daily Activity     Outcome Measure   Help from another person eating meals?: A Little Help from another person taking care of personal grooming?: A Little Help from another person toileting, which includes using toliet, bedpan, or urinal?: A Lot Help from another person bathing (including washing, rinsing, drying)?: A Lot Help from another person to put on and taking off regular upper body clothing?: A Little Help from another person to put on and taking off regular lower body clothing?: A Lot 6 Click Score: 15    End of Session Equipment Utilized During Treatment: Gait belt;Rolling walker (2 wheels);Oxygen  OT Visit Diagnosis: Unsteadiness on feet (R26.81);Muscle weakness (generalized) (M62.81)   Activity Tolerance Patient tolerated treatment well   Patient Left with call bell/phone within reach;in chair;with chair alarm set   Nurse Communication          Time: 2376-2831 OT Time Calculation (min): 20 min  Charges: OT General Charges $OT Visit: 1 Visit OT Treatments $Self Care/Home Management : 8-22 mins  Josiah Lobo, PhD, MS, OTR/L 11/26/20, 4:29 PM

## 2020-11-27 DIAGNOSIS — F331 Major depressive disorder, recurrent, moderate: Secondary | ICD-10-CM | POA: Diagnosis not present

## 2020-11-27 LAB — CBC WITH DIFFERENTIAL/PLATELET
Abs Immature Granulocytes: 0.02 10*3/uL (ref 0.00–0.07)
Basophils Absolute: 0 10*3/uL (ref 0.0–0.1)
Basophils Relative: 1 %
Eosinophils Absolute: 0.1 10*3/uL (ref 0.0–0.5)
Eosinophils Relative: 2 %
HCT: 38.5 % — ABNORMAL LOW (ref 39.0–52.0)
Hemoglobin: 12.8 g/dL — ABNORMAL LOW (ref 13.0–17.0)
Immature Granulocytes: 0 %
Lymphocytes Relative: 36 %
Lymphs Abs: 1.7 10*3/uL (ref 0.7–4.0)
MCH: 32.8 pg (ref 26.0–34.0)
MCHC: 33.2 g/dL (ref 30.0–36.0)
MCV: 98.7 fL (ref 80.0–100.0)
Monocytes Absolute: 0.5 10*3/uL (ref 0.1–1.0)
Monocytes Relative: 10 %
Neutro Abs: 2.4 10*3/uL (ref 1.7–7.7)
Neutrophils Relative %: 51 %
Platelets: 138 10*3/uL — ABNORMAL LOW (ref 150–400)
RBC: 3.9 MIL/uL — ABNORMAL LOW (ref 4.22–5.81)
RDW: 13 % (ref 11.5–15.5)
WBC: 4.7 10*3/uL (ref 4.0–10.5)
nRBC: 0 % (ref 0.0–0.2)

## 2020-11-27 LAB — COMPREHENSIVE METABOLIC PANEL
ALT: 20 U/L (ref 0–44)
AST: 25 U/L (ref 15–41)
Albumin: 3.3 g/dL — ABNORMAL LOW (ref 3.5–5.0)
Alkaline Phosphatase: 25 U/L — ABNORMAL LOW (ref 38–126)
Anion gap: 6 (ref 5–15)
BUN: 24 mg/dL — ABNORMAL HIGH (ref 8–23)
CO2: 32 mmol/L (ref 22–32)
Calcium: 11.3 mg/dL — ABNORMAL HIGH (ref 8.9–10.3)
Chloride: 101 mmol/L (ref 98–111)
Creatinine, Ser: 1.41 mg/dL — ABNORMAL HIGH (ref 0.61–1.24)
GFR, Estimated: 53 mL/min — ABNORMAL LOW (ref >60)
Glucose, Bld: 111 mg/dL — ABNORMAL HIGH (ref 70–99)
Potassium: 4.5 mmol/L (ref 3.5–5.1)
Sodium: 139 mmol/L (ref 135–145)
Total Bilirubin: 0.8 mg/dL (ref 0.3–1.2)
Total Protein: 6.5 g/dL (ref 6.5–8.1)

## 2020-11-27 LAB — MAGNESIUM: Magnesium: 1.4 mg/dL — ABNORMAL LOW (ref 1.7–2.4)

## 2020-11-27 LAB — BRAIN NATRIURETIC PEPTIDE: B Natriuretic Peptide: 326.1 pg/mL — ABNORMAL HIGH (ref 0.0–100.0)

## 2020-11-27 MED ORDER — FUROSEMIDE 10 MG/ML IJ SOLN
40.0000 mg | Freq: Every day | INTRAMUSCULAR | Status: DC
  Administered 2020-11-27: 14:00:00 40 mg via INTRAVENOUS
  Filled 2020-11-27: qty 4

## 2020-11-27 MED ORDER — FUROSEMIDE 40 MG PO TABS: 40.0000 mg | ORAL_TABLET | Freq: Every day | ORAL | Status: DC

## 2020-11-27 MED ORDER — MAGNESIUM SULFATE 4 GM/100ML IV SOLN
4.0000 g | Freq: Once | INTRAVENOUS | Status: AC
  Administered 2020-11-27: 4 g via INTRAVENOUS
  Filled 2020-11-27: qty 100

## 2020-11-27 NOTE — Progress Notes (Signed)
Physical Therapy Treatment Patient Details Name: [name] Sheeler Kulesza Jr. MRN: [id number] DOB: [birth year] Today's Date: 11/27/2020   History of Present Illness Pt is 75 yr old man with a known history of CAD, PAF, DM II, Peripheral neuropathy, hypertension, and history of stroke who according to his wife has intermittent episodes of confusion and disorientation at baseline. Pt presented to [facility] with increased short of breath with a progressively worsening cough for 4 days. He was admitted for acute hypoxic respiratory failure and acute metabolic encephalopathy    PT Comments    Pt alert in bed working with nursing for linen clean up. Pt more awake this session and reports sleeping better and that therapy following session was fatiguing. Pt progressed amb distance without LOB w/ RW stability, CGA for safety. Instructed to conserve energy during coughing spells by standing. Primary limitations remain decreased endurance and functional mobility requiring moderate assistance for tasks particularly bed mobility and transfers. Pt remains motivated and continues to work hard toward goals. SNF remains primary discharge recommendation to maximize functional mobility. Skilled PT intervention is indicated to address deficits in function, mobility, and to return to PLOF as able.     Recommendations for follow up therapy are one component of a multi-disciplinary discharge planning process, led by the attending physician.  Recommendations [place] be updated based on patient status, additional functional criteria and insurance authorization.  Follow Up Recommendations  Skilled nursing-short term rehab (<3 hours/day)     Assistance Recommended at Discharge Frequent or constant Supervision/Assistance  Equipment Recommendations  None recommended by PT    Recommendations for Other Services       Precautions / Restrictions Precautions Precautions: Fall Restrictions Weight Bearing Restrictions: No     Mobility  Bed  Mobility Overal bed mobility: Needs Assistance Bed Mobility: Supine to Sit     Supine to sit: Mod assist;HOB elevated     General bed mobility comments: moves BLEand attempts sit up to manage trunk, cues for rolling and pushing bottom UE for pushing    Transfers Overall transfer level: Needs assistance Equipment used: Rolling walker (2 wheels) Transfers: Sit to/from Stand Sit to Stand: Mod assist           General transfer comment: STS x2 from lowered surface, able to clear bottom but not enough momentum to fully stand    Ambulation/Gait Ambulation/Gait assistance: Min guard Gait Distance (Feet): 35 Feet Assistive device: Rolling walker (2 wheels) Gait Pattern/deviations: Step-to pattern;Decreased step length - right;Decreased step length - left Gait velocity: decreased     General Gait Details: Heavy UE usage, no LOB, cues for standing rest break 2/2 coughing   Stairs             Wheelchair Mobility    Modified Rankin (Stroke Patients Only)       Balance Overall balance assessment: Needs assistance Sitting-balance support: Bilateral upper extremity supported;Feet supported Sitting balance-Leahy Scale: Fair Sitting balance - Comments: challenged w/ dyanmic balance coming to EOB 2/2 posterior lean, maintains static balance w/ preferred usage of BUE   Standing balance support: Bilateral upper extremity supported;During functional activity Standing balance-Leahy Scale: Poor Standing balance comment: BUE support required                            Cognition Arousal/Alertness: Awake/alert Behavior During Therapy: Flat affect Overall Cognitive Status: History of cognitive impairments - at baseline  General Comments: oriented x2, cooperative, follows all commands consistently        Exercises      General Comments General comments (skin integrity, edema, etc.): 2L [place] SpO2 > 95%, HR during amb  85, resting HR 71      Pertinent Vitals/Pain Pain Assessment: Faces Faces Pain Scale: No hurt Pain Intervention(s): Monitored during session    Home Living                          Prior Function            PT Goals (current goals can now be found in the care plan section) Progress towards PT goals: Progressing toward goals    Frequency    Min 2X/week      PT Plan Current plan remains appropriate    Co-evaluation              AM-PAC PT "6 Clicks" Mobility   Outcome Measure  Help needed turning from your back to your side while in a flat bed without using bedrails?: A Little Help needed moving from lying on your back to sitting on the side of a flat bed without using bedrails?: A Lot Help needed moving to and from a bed to a chair (including a wheelchair)?: A Little Help needed standing up from a chair using your arms (e.g., wheelchair or bedside chair)?: A Little Help needed to walk in hospital room?: A Lot Help needed climbing 3-5 steps with a railing? : A Lot 6 Click Score: 15    End of Session Equipment Utilized During Treatment: Gait belt;Oxygen Activity Tolerance: Patient tolerated treatment well Patient left: in chair;with call bell/phone within reach;with chair alarm set;with family/visitor present Nurse Communication: Mobility status PT Visit Diagnosis: Unsteadiness on feet (R26.81);Muscle weakness (generalized) (M62.81);Difficulty in walking, not elsewhere classified (R26.2)     Time: 7416-3845 PT Time Calculation (min) (ACUTE ONLY): 26 min  Charges:                       [employer], SPT

## 2020-11-27 NOTE — TOC Progression Note (Signed)
Transition of Care (TOC) - Progression Note    Patient Details  Name: [name] MRN: [id number] Date of Birth: August 25, 1945  Transition of Care [facility]) CM/SW Contact  Shelbie Hutching, RN Phone Number: 11/27/2020, 1:21 PM  Clinical Narrative:    [facility] gotten insurance authorization.  Patient not ready for DC today but maybe tomorrow.     Expected Discharge Plan: [place] Barriers to Discharge: Continued Medical Work up  Expected Discharge Plan and Services Expected Discharge Plan: [place] In-house Referral: Clinical Social Work   Post Acute Care Choice: [place] Living arrangements for the past 2 months: Single Family Home                                       Social Determinants of Health (SDOH) Interventions    Readmission Risk Interventions No flowsheet data found.

## 2020-11-27 NOTE — Progress Notes (Signed)
PROGRESS NOTE    [name] Wolters Balkcom Jr.  WUX:[id number] DOB: 1945-06-07 DOA: 11/17/2020 PCP: Albina Billet, MD   Chief Complaint  Patient presents with   Altered Mental Status    Brief Narrative:  75 yr old man with [name] [name] known history of CAD, PAF, DM II, Peripheral neuropathy, Hypertension, and history of stroke who according to his wife has intermittent episodes of confusion and disorientation at baseline. He has been increasingly short of breath with Ysidra Sopher progressively worsening cough for 4 days.  He is admitted for acute hypoxic respiratory failure and acute metabolic encephalopathy   10/30 -possible aspiration event.  Oxygen requirement went up to 4 L.  Giving 40 of Lasix based on the chest x-ray showing pulmonary vascular congestion. 10/31: Weaned off to 2.5 L oxygen.  Pulmonary seen transferred to any MedSurg giving additional 40 mg of Lasix today PT, OT consult 11/1: PT,OT recommends SNF, palliative care consult  He's been treated for community acquired pneumonia and heart failure.  He's now completed [name] [name] course of antibiotics.  He continues to require O2 (no prior O2 requirement).  Currently continuing diuresis.  Hospitalization complicated by development of hypercalcemia, seems asymptomatic.  Follow.  Approaching readiness for discharge.  Assessment & Plan:   Principal Problem:   Depression Active Problems:   Peripheral neuropathy   Diabetes ([place])   Hyperlipidemia   Toxic metabolic encephalopathy   Coronary artery disease   Hypertension   Acute bronchitis due to infection   Community acquired pneumonia   Acute respiratory failure with hypoxia and hypercapnia (HCC)   Goals of care, counseling/discussion  Acute respiratory failure with hypoxia and hypercapnia (HCC) RA to 2 L, wean as tolerated Continues to require supplemental O2, despite not on O2 at baseline CT 11/7 with trace bilateral, L>R effusions, LLL airspace opacity (atelectasis vs infection/inflammation) He's completed  course of abx Continue diuresis   HFpEF exacerbation Continues to require supplemental O2 CT with trace bilateral effusions Continue IV lasix BNP elevated Strict I/O, daily weights  Hypercalcemia Not overtly symptomatic at this time -  fluctuating recently Mild range, he's not overtly symptomatic - s/p IVF, now needing lasix Jans consider bisphosphonate if worsening, can hold off for now Nonparathyroid hypercalcemia Vit D 33.15 Follow 1, 25 vit D - low Follow PTHrp - pending Stop vitamin D Needs further workup  Community acquired pneumonia Ceftriaxone/azithro 10/29 - 11/2 Pulmonary consult - recommened RVP (not collected), fungitell (negative), legionella ab (negative), urine strep negative, histoplasma urine ag (not collected/ordered), sputum cx - pending collection, afb expectorated specimen (discussed with pulm who did not recommend isolation)  Appreciate pulm, 11/3 last note Consider influenza vaccine prior to d/c  Toxic metabolic encephalopathy Likely related to resp failure and infection Ammonia improved on repeat B12 elevated, folate wnl, TSH wnl VBG without hypercarbia Delirium precautions - seems improved  Cognitive Deficit  Hallucinations Concerning for dementia Needs outpatient neurology  Essential Tremor Needs neuro follow outpt  Suicidal Ideation Psych c/s, appreciate assistance - started mirtazepine, doesn't meet criteria for inpatient psych admission  Deconditioning PT/OT recommending SNF  Goals of care, counseling/discussion Palliative care consult.  Patient is DNR.  Appreciate palliative care.   Hypertension Metoprolol irbesartan   Coronary artery disease Continue statin and Eliquis  metoprolol    Afib Eliquis  Hyperlipidemia Continue fenofibrate   Diabetes (HCC) Hemoglobin A1c of 6.  Blood sugars seem to be fairly well controlled   Peripheral neuropathy On Cymbalta  DVT prophylaxis: eliquis Code Status:DNR Family Communication:  wife  at bedside Disposition:   Status is: Inpatient  Remains inpatient appropriate because: continued acute hypoxic resp failure       Consultants:  pulmonology  Procedures: Echo IMPRESSIONS     1. Left ventricular ejection fraction, by estimation, is 60 to 65%. The  left ventricle has normal function. The left ventricle has no regional  wall motion abnormalities. Left ventricular diastolic parameters were  normal.   2. Right ventricular systolic function is normal. The right ventricular  size is normal. There is mildly elevated pulmonary artery systolic  pressure.   3. The mitral valve is normal in structure. Trivial mitral valve  regurgitation.   4. The aortic valve is normal in structure. Aortic valve regurgitation is  not visualized.   Antimicrobials:  Anti-infectives (From admission, onward)    Start     Dose/Rate Route Frequency Ordered Stop   11/17/20 1630  cefTRIAXone (ROCEPHIN) 2 g in sodium chloride 0.9 % 100 mL IVPB        2 g 200 mL/hr over 30 Minutes Intravenous Every 24 hours 11/17/20 1624 11/21/20 1751   11/17/20 1630  azithromycin (ZITHROMAX) 500 mg in sodium chloride 0.9 % 250 mL IVPB        500 mg 250 mL/hr over 60 Minutes Intravenous Every 24 hours 11/17/20 1624 11/21/20 1942          Subjective: Feels ok  Objective: Vitals:   11/27/20 0731 11/27/20 0815 11/27/20 1114 11/27/20 1405  BP:  (!) 158/75 (!) 146/68   Pulse:  60 66   Resp:  20 20   Temp:  (!) 97.2 F (36.2 C) 98.2 F (36.8 C)   TempSrc:      SpO2: 95% 100% 99% 95%  Weight:      Height:        Intake/Output Summary (Last 24 hours) at 11/27/2020 1422 Last data filed at 11/27/2020 1128 Gross per 24 hour  Intake 240 ml  Output 1400 ml  Net -1160 ml   Filed Weights   11/17/20 1119  Weight: 108.9 kg    Examination:  General: No acute distress. Cardiovascular: rrr Lungs: diminished Abdomen: Soft, nontender, nondistended  Neurological: Alert and oriented 3. Moves  all extremities 4. Cranial nerves II through XII grossly intact. Skin: Warm and dry. No rashes or lesions. Extremities: No clubbing or cyanosis. No edema.    Data Reviewed: I have personally reviewed following labs and imaging studies  CBC: Recent Labs  Lab 11/23/20 0644 11/24/20 0413 11/25/20 0407 11/26/20 0400 11/27/20 0808  WBC 3.9* 4.2 3.6* 4.9 4.7  NEUTROABS 1.8 2.2 1.7 2.6 2.4  HGB 12.7* 12.4* 11.8* 12.3* 12.8*  HCT 37.9* 37.4* 35.5* 36.7* 38.5*  MCV 99.0 100.5* 100.6* 100.0 98.7  PLT 158 133* 127* 137* 138*    Basic Metabolic Panel: Recent Labs  Lab 11/22/20 0436 11/22/20 0850 11/23/20 0644 11/23/20 1534 11/24/20 0413 11/25/20 0407 11/26/20 0400 11/27/20 0808  NA 140   < > 139 139 138 139 140 139  K 4.3   < > 3.9 4.1 3.8 3.7 3.9 4.5  CL 94*   < > 98 96* 101 105 105 101  CO2 39*   < > 34* 35* 32 29 30 32  GLUCOSE 144*   < > 119* 157* 145* 133* 131* 111*  BUN 17   < > 21 22 20 18 20  24*  CREATININE 1.14   < > 1.22 1.41* 1.37* 1.12 1.21 1.41*  CALCIUM 11.4*   < > 11.2*  11.4* 11.1* 10.8* 12.1* 11.3*  MG 1.6*  --  1.5*  --  1.9 1.5* 1.7 1.4*  PHOS 4.2  --  4.0  --  3.4 3.7 4.0  --    < > = values in this interval not displayed.    GFR: Estimated Creatinine Clearance: 55.8 mL/min ([name] [name]) (by C-G formula based on SCr of 1.41 mg/dL (H)).  Liver Function Tests: Recent Labs  Lab 11/23/20 0644 11/23/20 1534 11/24/20 0413 11/25/20 0407 11/26/20 0400 11/27/20 0808  AST 19  --  19 18 22 25   ALT 17  --  16 17 18 20   ALKPHOS 27*  --  27* 23* 26* 25*  BILITOT 0.7  --  0.7 0.8 0.6 0.8  PROT 6.3*  --  5.9* 5.6* 6.4* 6.5  ALBUMIN 2.9* 2.9* 2.8* 2.6* 2.9* 3.3*    CBG: Recent Labs  Lab 11/24/20 1706 11/26/20 0833  GLUCAP 156* 160*     Recent Results (from the past 240 hour(s))  Blood culture (routine single)     Status: None   Collection Time: 11/17/20  3:42 PM   Specimen: BLOOD  Result Value Ref Range Status   Specimen Description BLOOD RIGHT  ANTECUBITAL  Final   Special Requests   Final    BOTTLES DRAWN AEROBIC AND ANAEROBIC Blood Culture adequate volume   Culture   Final    NO GROWTH 5 DAYS Performed at [facility], [address] [place], [place] [address]    Report Status 11/22/2020 FINAL  Final  Resp Panel by RT-PCR (Flu Monterio Bob&B, Covid) Nasopharyngeal Swab     Status: None   Collection Time: 11/17/20  3:56 PM   Specimen: Nasopharyngeal Swab; Nasopharyngeal(NP) swabs in vial transport medium  Result Value Ref Range Status   SARS Coronavirus 2 by RT PCR NEGATIVE NEGATIVE Final    Comment: (NOTE) SARS-CoV-2 target nucleic acids are NOT DETECTED.  The SARS-CoV-2 RNA is generally detectable in upper respiratory specimens during the acute phase of infection. The lowest concentration of SARS-CoV-2 viral copies this assay can detect is 138 copies/mL. [name] [name] negative result does not preclude SARS-Cov-2 infection and should not be used as the sole basis for treatment or other patient management decisions. [name] [name] negative result [name] [name] occur with  improper specimen collection/handling, submission of specimen other than nasopharyngeal swab, presence of viral mutation(s) within the areas targeted by this assay, and inadequate number of viral copies(<138 copies/mL). [name] [name] negative result must be combined with clinical observations, patient history, and epidemiological information. The expected result is Negative.  Fact Sheet for Patients:  EntrepreneurPulse.com.au  Fact Sheet for Healthcare Providers:  IncredibleEmployment.be  This test is no t yet approved or cleared by the [place] FDA and  has been authorized for detection and/or diagnosis of SARS-CoV-2 by FDA under an Emergency Use Authorization (EUA). This EUA will remain  in effect (meaning this test can be used) for the duration of the COVID-19 declaration under Section 564(b)(1) of the Act, 21 U.S.C.section 360bbb-3(b)(1), unless the  authorization is terminated  or revoked sooner.       Influenza [name] [name] by PCR NEGATIVE NEGATIVE Final   Influenza B by PCR NEGATIVE NEGATIVE Final    Comment: (NOTE) The Xpert Xpress SARS-CoV-2/FLU/RSV plus assay is intended as an aid in the diagnosis of influenza from Nasopharyngeal swab specimens and should not be used as [name] [name] sole basis for treatment. Nasal washings and aspirates are unacceptable for Xpert Xpress SARS-CoV-2/FLU/RSV testing.  Fact Sheet for Patients: EntrepreneurPulse.com.au  Fact Sheet for Healthcare Providers: IncredibleEmployment.be  This test is not yet approved or cleared by the [place] FDA and has been authorized for detection and/or diagnosis of SARS-CoV-2 by FDA under an Emergency Use Authorization (EUA). This EUA will remain in effect (meaning this test can be used) for the duration of the COVID-19 declaration under Section 564(b)(1) of the Act, 21 U.S.C. section 360bbb-3(b)(1), unless the authorization is terminated or revoked.  Performed at [facility], [address] [place], [place] [address]   Blood culture (single)     Status: None   Collection Time: 11/17/20  4:43 PM   Specimen: BLOOD  Result Value Ref Range Status   Specimen Description BLOOD RIGHT HAND  Final   Special Requests   Final    BOTTLES DRAWN AEROBIC AND ANAEROBIC Blood Culture results Esterly not be optimal due to an inadequate volume of blood received in culture bottles   Culture   Final    NO GROWTH 5 DAYS Performed at [facility], [address], [place], [place] [address]    Report Status 11/22/2020 FINAL  Final         Radiology Studies: CT CHEST WO CONTRAST  Result Date: 11/26/2020 CLINICAL DATA:  Respiratory failure EXAM: CT CHEST WITHOUT CONTRAST TECHNIQUE: Multidetector CT imaging of the chest was performed following the standard protocol without IV contrast. COMPARISON:  None. FINDINGS: Cardiovascular:  Normal heart size. No significant pericardial effusion. The thoracic aorta is normal in caliber. Moderate atherosclerotic plaque of the thoracic aorta. Four-vessel coronary artery calcifications. Mediastinum/Nodes: No gross hilar adenopathy, noting limited sensitivity for the detection of hilar adenopathy on this noncontrast study. No enlarged mediastinal or axillary lymph nodes. Thyroid gland, trachea, and esophagus demonstrate no significant findings. Lungs/Pleura: Limited evaluation due to motion artifact. Left lower lobe airspace opacity. No pulmonary nodule. No pulmonary mass. Trace bilateral, left greater than right, pleural effusions. No pneumothorax. Upper Abdomen: No acute abnormality. Musculoskeletal: No chest wall abnormality. No suspicious lytic or blastic osseous lesions. No acute displaced fracture. Old healed right rib fractures. Multilevel degenerative changes of the spine. IMPRESSION: 1. Trace bilateral, left greater than right, pleural effusions. 2. Associated left lower lobe airspace opacity that Severs represent atelectasis versus infection/inflammation. Limited evaluation on this noncontrast study. 3. Aortic Atherosclerosis (ICD10-I70.0) including four-vessel coronary calcifications. Electronically Signed   By: Iven Finn M.D.   On: 11/26/2020 21:19   DG Chest Port 1 View  Result Date: 11/25/2020 CLINICAL DATA:  Hypoxia EXAM: PORTABLE CHEST 1 VIEW COMPARISON:  11/23/2020 FINDINGS: Cardiac shadow is stable. Aortic calcifications are again seen. The lungs are well aerated bilaterally. No focal infiltrate or sizable effusion is noted. Previously seen left basilar atelectasis has resolved. No bony abnormality is noted. IMPRESSION: No active disease. Electronically Signed   By: Inez Catalina M.D.   On: 11/25/2020 21:38        Scheduled Meds:  albuterol  2.5 mg Nebulization Q6H   apixaban  5 mg Oral BID   bisacodyl  5 mg Oral Daily   DULoxetine  30 mg Oral BID   fenofibrate  160 mg  Oral Daily   folic acid  1 mg Oral Daily   furosemide  40 mg Intravenous Daily   influenza vaccine adjuvanted  0.5 mL Intramuscular Once   irbesartan  150 mg Oral Daily   lactulose  20 g Oral Daily   metoprolol succinate  100 mg Oral Daily   mirtazapine  15 mg Oral QHS  multivitamin with minerals  1 tablet Oral Daily   senna-docusate  2 tablet Oral BID   simvastatin  40 mg Oral QHS   Continuous Infusions:  sodium chloride 10 mL/hr (11/18/20 1818)     LOS: 10 days    Time spent: over 30 min    Fayrene Helper, MD [facility]   To contact the attending provider between 7A-7P or the covering provider during after hours 7P-7A, please log into the web site [email or web address] and access using universal [place] password for that web site. If you do not have the password, please call the hospital operator.  11/27/2020, 2:22 PM

## 2020-11-28 DIAGNOSIS — J9601 Acute respiratory failure with hypoxia: Secondary | ICD-10-CM | POA: Diagnosis not present

## 2020-11-28 DIAGNOSIS — I10 Essential (primary) hypertension: Secondary | ICD-10-CM | POA: Diagnosis not present

## 2020-11-28 DIAGNOSIS — J209 Acute bronchitis, unspecified: Secondary | ICD-10-CM | POA: Diagnosis not present

## 2020-11-28 DIAGNOSIS — I2510 Atherosclerotic heart disease of native coronary artery without angina pectoris: Secondary | ICD-10-CM | POA: Diagnosis not present

## 2020-11-28 LAB — COMPREHENSIVE METABOLIC PANEL
ALT: 18 U/L (ref 0–44)
AST: 21 U/L (ref 15–41)
Albumin: 3.2 g/dL — ABNORMAL LOW (ref 3.5–5.0)
Alkaline Phosphatase: 26 U/L — ABNORMAL LOW (ref 38–126)
Anion gap: 7 (ref 5–15)
BUN: 27 mg/dL — ABNORMAL HIGH (ref 8–23)
CO2: 32 mmol/L (ref 22–32)
Calcium: 11.3 mg/dL — ABNORMAL HIGH (ref 8.9–10.3)
Chloride: 99 mmol/L (ref 98–111)
Creatinine, Ser: 1.43 mg/dL — ABNORMAL HIGH (ref 0.61–1.24)
GFR, Estimated: 52 mL/min — ABNORMAL LOW (ref >60)
Glucose, Bld: 125 mg/dL — ABNORMAL HIGH (ref 70–99)
Potassium: 3.5 mmol/L (ref 3.5–5.1)
Sodium: 138 mmol/L (ref 135–145)
Total Bilirubin: 0.7 mg/dL (ref 0.3–1.2)
Total Protein: 6.5 g/dL (ref 6.5–8.1)

## 2020-11-28 LAB — CBC WITH DIFFERENTIAL/PLATELET
Abs Immature Granulocytes: 0.02 10*3/uL (ref 0.00–0.07)
Basophils Absolute: 0 10*3/uL (ref 0.0–0.1)
Basophils Relative: 1 %
Eosinophils Absolute: 0.1 10*3/uL (ref 0.0–0.5)
Eosinophils Relative: 3 %
HCT: 37.4 % — ABNORMAL LOW (ref 39.0–52.0)
Hemoglobin: 12.4 g/dL — ABNORMAL LOW (ref 13.0–17.0)
Immature Granulocytes: 0 %
Lymphocytes Relative: 38 %
Lymphs Abs: 1.9 10*3/uL (ref 0.7–4.0)
MCH: 32.8 pg (ref 26.0–34.0)
MCHC: 33.2 g/dL (ref 30.0–36.0)
MCV: 98.9 fL (ref 80.0–100.0)
Monocytes Absolute: 0.6 10*3/uL (ref 0.1–1.0)
Monocytes Relative: 12 %
Neutro Abs: 2.3 10*3/uL (ref 1.7–7.7)
Neutrophils Relative %: 46 %
Platelets: 135 10*3/uL — ABNORMAL LOW (ref 150–400)
RBC: 3.78 MIL/uL — ABNORMAL LOW (ref 4.22–5.81)
RDW: 13 % (ref 11.5–15.5)
WBC: 4.9 10*3/uL (ref 4.0–10.5)
nRBC: 0 % (ref 0.0–0.2)

## 2020-11-28 LAB — MAGNESIUM: Magnesium: 1.9 mg/dL (ref 1.7–2.4)

## 2020-11-28 LAB — PHOSPHORUS: Phosphorus: 4.5 mg/dL (ref 2.5–4.6)

## 2020-11-28 MED ORDER — VITAMIN D (ERGOCALCIFEROL) 1.25 MG (50000 UNIT) PO CAPS
50000.0000 [IU] | ORAL_CAPSULE | ORAL | Status: DC
  Administered 2020-11-28: 14:00:00 50000 [IU] via ORAL
  Filled 2020-11-28: qty 1

## 2020-11-28 MED ORDER — ALBUTEROL SULFATE (2.5 MG/3ML) 0.083% IN NEBU
2.5000 mg | INHALATION_SOLUTION | Freq: Two times a day (BID) | RESPIRATORY_TRACT | Status: DC
  Administered 2020-11-28 – 2020-11-29 (×2): 2.5 mg via RESPIRATORY_TRACT
  Filled 2020-11-28 (×2): qty 3

## 2020-11-28 NOTE — Care Management Important Message (Signed)
Important Message  Patient Details  Name: [name] Fitzwater Deviney Jr. MRN: [id number] Date of Birth: [birth year]   Medicare Important Message Given:  Yes     Juliann Pulse A [name] 11/28/2020, 1:44 PM

## 2020-11-28 NOTE — Progress Notes (Signed)
Occupational Therapy Treatment Patient Details Name: [name] Hodkinson Boyack Jr. MRN: [id number] DOB: [birth year] Today's Date: 11/28/2020   History of present illness Pt is 75 yr old man with a known history of CAD, PAF, DM II, Peripheral neuropathy, hypertension, and history of stroke who according to his wife has intermittent episodes of confusion and disorientation at baseline. Pt [facility] to [facility] with increased short of breath with a progressively worsening cough for 4 days. He was admitted for acute hypoxic respiratory failure and acute metabolic encephalopathy   OT comments  Pt seen for OT tx this date to f/u re: safety with ADLs/ADL mobility. Pt requires MIN A to come to EOB sitting. Able to static sit with F balance using UE intermittently for support. Pt requires SETUP for g/h tasks and MIN A for UB bathing/dressing in sitting. Pt requires MIN A to CTS from slightly elevated EOB surface and MAX A for peri bathing tasks. Pt returned to bed with all needs met and in reach and pt's spouse present throughout. Will continue to follow.    Recommendations for follow up therapy are one component of a multi-disciplinary discharge planning process, led by the attending physician.  Recommendations Noto be updated based on patient status, additional functional criteria and insurance authorization.    Follow Up Recommendations  Skilled nursing-short term rehab (<3 hours/day)    Assistance Recommended at Discharge Frequent or constant Supervision/Assistance  Equipment Recommendations  Other (comment) (defer)    Recommendations for Other Services      Precautions / Restrictions Precautions Precautions: Fall Restrictions Weight Bearing Restrictions: No       Mobility Bed Mobility Overal bed mobility: Needs Assistance Bed Mobility: Supine to Sit;Sit to Supine     Supine to sit: Min guard;Min assist;HOB elevated Sit to supine: Min assist   General bed mobility comments: MIN A for assist with LEs back  to bed    Transfers Overall transfer level: Needs assistance Equipment used: Rolling walker (2 wheels) Transfers: Sit to/from Stand Sit to Stand: Min assist;From elevated surface           General transfer comment: EOB elevated ~2-3 inches, RW, safety cues     Balance Overall balance assessment: Needs assistance Sitting-balance support: Bilateral upper extremity supported;Feet supported Sitting balance-Leahy Scale: Fair     Standing balance support: Bilateral upper extremity supported;During functional activity Standing balance-Leahy Scale: Poor Standing balance comment: requires UE support and at least MIN A external support to sustain static standing                           ADL either performed or assessed with clinical judgement   ADL Overall ADL's : Needs assistance/impaired     Grooming: Wash/dry face;Set up;Min guard;Sitting Grooming Details (indicate cue type and reason): CGA for sitting balance Upper Body Bathing: Minimal assistance;Sitting Upper Body Bathing Details (indicate cue type and reason): cues for sequencing Lower Body Bathing: Moderate assistance;Maximal assistance;Sit to/from stand Lower Body Bathing Details (indicate cue type and reason): MIN/MOD A for STS with RW, MOD/MAX A for actual posterior LB bathing task d/t pt with poor  standing balance Upper Body Dressing : Set up;Cueing for sequencing;Sitting   Lower Body Dressing: Maximal assistance;Sitting/lateral leans Lower Body Dressing Details (indicate cue type and reason): socks, d/t limited dynamic sitting balance                    Extremity/Trunk Assessment Upper Extremity Assessment Upper Extremity Assessment:  Generalized weakness   Lower Extremity Assessment Lower Extremity Assessment: Generalized weakness        Vision Patient Visual Report: No change from baseline     Perception     Praxis      Cognition Arousal/Alertness: Awake/alert Behavior During  Therapy: Flat affect Overall Cognitive Status: History of cognitive impairments - at baseline                                 General Comments: oriented x2, cooperative, follows all commands consistently. Somewhat drowsy, increased processing time.          Exercises Other Exercises Other Exercises: OT engages pt grooming/bathing tasks.   Shoulder Instructions       General Comments      Pertinent Vitals/ Pain       Pain Assessment: Faces Faces Pain Scale: No hurt  Home Living                                          Prior Functioning/Environment              Frequency  Min 1X/week        Progress Toward Goals  OT Goals(current goals can now be found in the care plan section)  Progress towards OT goals: Progressing toward goals  Acute Rehab OT Goals Patient Stated Goal: home with wife OT Goal Formulation: With patient Time For Goal Achievement: 12/03/20 Potential to Achieve Goals: Good  Plan Discharge plan remains appropriate;Frequency remains appropriate    Co-evaluation                 AM-PAC OT "6 Clicks" Daily Activity     Outcome Measure   Help from another person eating meals?: A Little Help from another person taking care of personal grooming?: A Little Help from another person toileting, which includes using toliet, bedpan, or urinal?: A Lot Help from another person bathing (including washing, rinsing, drying)?: A Lot Help from another person to put on and taking off regular upper body clothing?: A Little Help from another person to put on and taking off regular lower body clothing?: A Lot 6 Click Score: 15    End of Session Equipment Utilized During Treatment: Gait belt;Rolling walker (2 wheels);Oxygen  OT Visit Diagnosis: Unsteadiness on feet (R26.81);Muscle weakness (generalized) (M62.81)   Activity Tolerance Patient tolerated treatment well   Patient Left with call bell/phone within reach;in  chair;with chair alarm set   Nurse Communication Mobility status        Time: 1044-1100 OT Time Calculation (min): 16 min  Charges: OT General Charges $OT Visit: 1 Visit OT Treatments $Self Care/Home Management : 8-22 mins  Gerrianne Scale, [place], OTR/L ascom [phone] 11/28/20, 2:32 PM

## 2020-11-28 NOTE — Progress Notes (Signed)
PROGRESS NOTE    [name] Carlberg Hodak Jr.  QMG:[id number] DOB: [birth year] DOA: 11/17/2020 PCP: Albina Billet, MD   Chief Complaint  Patient presents with   Altered Mental Status    Brief Narrative:  75 yr old man with a known history of CAD, PAF, DM II, Peripheral neuropathy, Hypertension, and history of stroke who according to his wife has intermittent episodes of confusion and disorientation at baseline. He has been increasingly short of breath with a progressively worsening cough for 4 days.  He is admitted for acute hypoxic respiratory failure and acute metabolic encephalopathy   10/30 -possible aspiration event.  Oxygen requirement went up to 4 L.  Giving 40 of Lasix based on the chest x-ray showing pulmonary vascular congestion. 10/31: Weaned off to 2.5 L oxygen.  Pulmonary seen transferred to any MedSurg giving additional 40 mg of Lasix today PT, OT consult 11/1: PT,OT recommends SNF, palliative care consult  He's been treated for community acquired pneumonia and heart failure.  He's now completed a course of antibiotics.  He continues to require O2 (no prior O2 requirement).  Currently continuing diuresis.  Hospitalization complicated by development of hypercalcemia, seems asymptomatic.  Follow.  Approaching readiness for discharge.  11/9 no overnight issues  Assessment & Plan:   Principal Problem:   Depression Active Problems:   Peripheral neuropathy   Diabetes ([place])   Hyperlipidemia   Toxic metabolic encephalopathy   Coronary artery disease   Hypertension   Acute bronchitis due to infection   Community acquired pneumonia   Acute respiratory failure with hypoxia and hypercapnia (HCC)   Goals of care, counseling/discussion  Acute respiratory failure with hypoxia and hypercapnia (HCC) RA to 2 L, wean as tolerated Continues to require supplemental O2, despite not on O2 at baseline CT 11/7 with trace bilateral, L>R effusions, LLL airspace opacity (atelectasis vs  infection/inflammation) He's completed course of abx 11/9 appears euvolemic.will hold lasix as creatinine up.    HFpEF exacerbation Continues to require supplemental O2 CT with trace bilateral effusions 11/9 more euvolemic. DC Lasix as creatinine up I/o   Hypercalcemia Not overtly symptomatic at this time -  fluctuating recently Mild range, he's not overtly symptomatic - s/p IVF, now needing lasix Yusuf consider bisphosphonate if worsening, can hold off for now Nonparathyroid hypercalcemia Vit D 33.15 Follow 1, 25 vit D - low Follow PTHrp - pending 11/9 vitamin D on hold Follow-up labs  Community acquired pneumonia Ceftriaxone/azithro 10/29 - 11/2 Pulmonary consult - recommened RVP (not collected), fungitell (negative), legionella ab (negative), urine strep negative, histoplasma urine ag (not collected/ordered), sputum cx - pending collection, afb expectorated specimen (discussed with pulm who did not recommend isolation)  Appreciate pulm, 11/3 last note Consider influenza vaccine prior to d/c 11/9 wean off 02-d/w nursing  Toxic metabolic encephalopathy Likely related to resp failure and infection Ammonia improved on repeat B12 elevated, folate wnl, TSH wnl VBG without hypercarbia Delirium precautions - seems improved  Cognitive Deficit  Hallucinations Concerning for dementia Needs outpatient neurology  Essential Tremor Needs neuro follow outpt  Suicidal Ideation Psych c/s, appreciate assistance - started mirtazepine, doesn't meet criteria for inpatient psych admission  Deconditioning PT/OT recommending SNF  Goals of care, counseling/discussion Palliative care consult.  Patient is DNR.  Appreciate palliative care.   Hypertension Metoprolol irbesartan   Coronary artery disease Continue statin and Eliquis  metoprolol    Afib Eliquis  Hyperlipidemia Continue fenofibrate   Diabetes (HCC) Hemoglobin A1c of 6.  Blood sugars seem to be  fairly well  controlled   Peripheral neuropathy On Cymbalta  DVT prophylaxis: eliquis Code Status:DNR Family Communication:  Disposition:   Status is: Inpatient  Remains inpatient appropriate because: continued acute hypoxic resp failure       Consultants:  pulmonology  Procedures: Echo IMPRESSIONS     1. Left ventricular ejection fraction, by estimation, is 60 to 65%. The  left ventricle has normal function. The left ventricle has no regional  wall motion abnormalities. Left ventricular diastolic parameters were  normal.   2. Right ventricular systolic function is normal. The right ventricular  size is normal. There is mildly elevated pulmonary artery systolic  pressure.   3. The mitral valve is normal in structure. Trivial mitral valve  regurgitation.   4. The aortic valve is normal in structure. Aortic valve regurgitation is  not visualized.   Antimicrobials:  Anti-infectives (From admission, onward)    Start     Dose/Rate Route Frequency Ordered Stop   11/17/20 1630  cefTRIAXone (ROCEPHIN) 2 g in sodium chloride 0.9 % 100 mL IVPB        2 g 200 mL/hr over 30 Minutes Intravenous Every 24 hours 11/17/20 1624 11/21/20 1751   11/17/20 1630  azithromycin (ZITHROMAX) 500 mg in sodium chloride 0.9 % 250 mL IVPB        500 mg 250 mL/hr over 60 Minutes Intravenous Every 24 hours 11/17/20 1624 11/21/20 1942          Subjective: No sob, no cp  Objective: Vitals:   11/27/20 1959 11/28/20 0050 11/28/20 0527 11/28/20 0817  BP: (!) 134/58 135/73 (!) 130/94 (!) 145/61  Pulse: (!) 58 80 (!) 50 71  Resp: 18 18 18 18   Temp: 97.6 F (36.4 C) 98 F (36.7 C) 97.7 F (36.5 C) (!) 97.5 F (36.4 C)  TempSrc: Oral Oral Oral Oral  SpO2: 95% 95% 95% 94%  Weight:      Height:        Intake/Output Summary (Last 24 hours) at 11/28/2020 0910 Last data filed at 11/28/2020 7096 Gross per 24 hour  Intake 523.8 ml  Output 2800 ml  Net -2276.2 ml   Filed Weights   11/17/20 1119   Weight: 108.9 kg    Examination:  Nad, calm Cta no w/r Regular s1/s2 no gallop Soft benign +bs No edema Mood and affect appropriate in current setting   Data Reviewed: I have personally reviewed following labs and imaging studies  CBC: Recent Labs  Lab 11/24/20 0413 11/25/20 0407 11/26/20 0400 11/27/20 0808 11/28/20 0541  WBC 4.2 3.6* 4.9 4.7 4.9  NEUTROABS 2.2 1.7 2.6 2.4 2.3  HGB 12.4* 11.8* 12.3* 12.8* 12.4*  HCT 37.4* 35.5* 36.7* 38.5* 37.4*  MCV 100.5* 100.6* 100.0 98.7 98.9  PLT 133* 127* 137* 138* 135*    Basic Metabolic Panel: Recent Labs  Lab 11/23/20 0644 11/23/20 1534 11/24/20 0413 11/25/20 0407 11/26/20 0400 11/27/20 0808 11/28/20 0541  NA 139   < > 138 139 140 139 138  K 3.9   < > 3.8 3.7 3.9 4.5 3.5  CL 98   < > 101 105 105 101 99  CO2 34*   < > 32 29 30 32 32  GLUCOSE 119*   < > 145* 133* 131* 111* 125*  BUN 21   < > 20 18 20  24* 27*  CREATININE 1.22   < > 1.37* 1.12 1.21 1.41* 1.43*  CALCIUM 11.2*   < > 11.1* 10.8* 12.1* 11.3* 11.3*  MG 1.5*  --  1.9 1.5* 1.7 1.4* 1.9  PHOS 4.0  --  3.4 3.7 4.0  --  4.5   < > = values in this interval not displayed.    GFR: Estimated Creatinine Clearance: 55.1 mL/min (A) (by C-G formula based on SCr of 1.43 mg/dL (H)).  Liver Function Tests: Recent Labs  Lab 11/24/20 0413 11/25/20 0407 11/26/20 0400 11/27/20 0808 11/28/20 0541  AST 19 18 22 25 21   ALT 16 17 18 20 18   ALKPHOS 27* 23* 26* 25* 26*  BILITOT 0.7 0.8 0.6 0.8 0.7  PROT 5.9* 5.6* 6.4* 6.5 6.5  ALBUMIN 2.8* 2.6* 2.9* 3.3* 3.2*    CBG: Recent Labs  Lab 11/24/20 1706 11/26/20 0833  GLUCAP 156* 160*     No results found for this or any previous visit (from the past 240 hour(s)).        Radiology Studies: CT CHEST WO CONTRAST  Result Date: 11/26/2020 CLINICAL DATA:  Respiratory failure EXAM: CT CHEST WITHOUT CONTRAST TECHNIQUE: Multidetector CT imaging of the chest was performed following the standard protocol without IV  contrast. COMPARISON:  None. FINDINGS: Cardiovascular: Normal heart size. No significant pericardial effusion. The thoracic aorta is normal in caliber. Moderate atherosclerotic plaque of the thoracic aorta. Four-vessel coronary artery calcifications. Mediastinum/Nodes: No gross hilar adenopathy, noting limited sensitivity for the detection of hilar adenopathy on this noncontrast study. No enlarged mediastinal or axillary lymph nodes. Thyroid gland, trachea, and esophagus demonstrate no significant findings. Lungs/Pleura: Limited evaluation due to motion artifact. Left lower lobe airspace opacity. No pulmonary nodule. No pulmonary mass. Trace bilateral, left greater than right, pleural effusions. No pneumothorax. Upper Abdomen: No acute abnormality. Musculoskeletal: No chest wall abnormality. No suspicious lytic or blastic osseous lesions. No acute displaced fracture. Old healed right rib fractures. Multilevel degenerative changes of the spine. IMPRESSION: 1. Trace bilateral, left greater than right, pleural effusions. 2. Associated left lower lobe airspace opacity that Schmoker represent atelectasis versus infection/inflammation. Limited evaluation on this noncontrast study. 3. Aortic Atherosclerosis (ICD10-I70.0) including four-vessel coronary calcifications. Electronically Signed   By: Iven Finn M.D.   On: 11/26/2020 21:19        Scheduled Meds:  albuterol  2.5 mg Nebulization BID   apixaban  5 mg Oral BID   bisacodyl  5 mg Oral Daily   DULoxetine  30 mg Oral BID   fenofibrate  160 mg Oral Daily   folic acid  1 mg Oral Daily   furosemide  40 mg Intravenous Daily   influenza vaccine adjuvanted  0.5 mL Intramuscular Once   irbesartan  150 mg Oral Daily   lactulose  20 g Oral Daily   metoprolol succinate  100 mg Oral Daily   mirtazapine  15 mg Oral QHS   multivitamin with minerals  1 tablet Oral Daily   senna-docusate  2 tablet Oral BID   simvastatin  40 mg Oral QHS   Continuous Infusions:   sodium chloride 10 mL/hr (11/18/20 1818)     LOS: 11 days    Time spent: 35 minutes with more than 50% on [place], MD [facility]   To contact the attending provider between 7A-7P or the covering provider during after hours 7P-7A, please log into the web site [email or web address] and access using universal [place] password for that web site. If you do not have the password, please call the hospital operator.  11/28/2020, 9:10 AM

## 2020-11-28 NOTE — Progress Notes (Signed)
Physical Therapy Treatment Patient Details Name: [name] Fries Saling Jr. MRN: [id number] DOB: 11-Feb-1945 Today's Date: 11/28/2020   History of Present Illness Pt is 75 yr old man with a known history of CAD, PAF, DM II, Peripheral neuropathy, hypertension, and history of stroke who according to his wife has intermittent episodes of confusion and disorientation at baseline. Pt presented to [facility] with increased short of breath with a progressively worsening cough for 4 days. He was admitted for acute hypoxic respiratory failure and acute metabolic encephalopathy    PT Comments    Pt asleep, awoken by verbal cues while denying pain or discomfort. Pt agreeable to treatment and ambulation attempts outside of room. Pt progressing in level of assistance required for transfers (between CGA and min-A) w/ RW. Pt requested BSC for BM, able to step to [facility] w/ RW and sit with proper hand techniques w/ RW indicating carryover from previous treatment session. Pt requested additional time to complete BM, pt left with nursing ending session. Increased coughing with upright mobility noted this session, cues for relaxation provided but unable to produce sputum during session. PT to attempt further amb next treatment session. Discharges remain SNF. Skilled PT intervention is indicated to address deficits in function, mobility, and to return to PLOF as able.     Recommendations for follow up therapy are one component of a multi-disciplinary discharge planning process, led by the attending physician.  Recommendations Basque be updated based on patient status, additional functional criteria and insurance authorization.  Follow Up Recommendations  Skilled nursing-short term rehab (<3 hours/day)     Assistance Recommended at Discharge Frequent or constant Supervision/Assistance  Equipment Recommendations  None recommended by PT    Recommendations for Other Services       Precautions / Restrictions Precautions Precautions:  Fall Restrictions Weight Bearing Restrictions: No     Mobility  Bed Mobility Overal bed mobility: Needs Assistance Bed Mobility: Supine to Sit     Supine to sit: Min assist;HOB elevated Sit to supine: Min assist   General bed mobility comments: MIN A for assist with LEs back to bed    Transfers Overall transfer level: Needs assistance Equipment used: Rolling walker (2 wheels) Transfers: Sit to/from Stand;Bed to chair/wheelchair/BSC Sit to Stand: From elevated surface;Min assist     Step pivot transfers: Min guard     General transfer comment: very little assist provided inbetween CGA and min-A w/ bed in lowest position w/ RW    Ambulation/Gait               General Gait Details: not attempted as pt requested staying on [facility] for awhile   Stairs             Wheelchair Mobility    Modified Rankin (Stroke Patients Only)       Balance Overall balance assessment: Needs assistance Sitting-balance support: Bilateral upper extremity supported;Feet supported Sitting balance-Leahy Scale: Fair Sitting balance - Comments: BUE support 2/2 fatigue, coughing   Standing balance support: Bilateral upper extremity supported;During functional activity Standing balance-Leahy Scale: Poor Standing balance comment: requires UE support                            Cognition Arousal/Alertness: Awake/alert Behavior During Therapy: Flat affect Overall Cognitive Status: History of cognitive impairments - at baseline  General Comments: oriented x2, cooperative, follows all commands consistently. Somewhat drowsy, increased processing time.        Exercises Other Exercises Other Exercises: OT engages pt grooming/bathing tasks.    General Comments        Pertinent Vitals/Pain Pain Assessment: No/denies pain Faces Pain Scale: No hurt    Home Living                          Prior Function             PT Goals (current goals can now be found in the care plan section) Progress towards PT goals: Progressing toward goals    Frequency    Min 2X/week      PT Plan Current plan remains appropriate    Co-evaluation              AM-PAC PT "6 Clicks" Mobility   Outcome Measure  Help needed turning from your back to your side while in a flat bed without using bedrails?: A Little Help needed moving from lying on your back to sitting on the side of a flat bed without using bedrails?: A Lot Help needed moving to and from a bed to a chair (including a wheelchair)?: A Little Help needed standing up from a chair using your arms (e.g., wheelchair or bedside chair)?: A Little Help needed to walk in hospital room?: A Lot Help needed climbing 3-5 steps with a railing? : A Lot 6 Click Score: 15    End of Session Equipment Utilized During Treatment: Gait belt;Oxygen Activity Tolerance: Patient tolerated treatment well Patient left: with family/visitor present;with nursing/sitter in room [facility]) Nurse Communication: Mobility status PT Visit Diagnosis: Unsteadiness on feet (R26.81);Muscle weakness (generalized) (M62.81);Difficulty in walking, not elsewhere classified (R26.2)     Time: 4097-3532 PT Time Calculation (min) (ACUTE ONLY): 24 min  Charges:                        [employer], SPT

## 2020-11-29 DIAGNOSIS — J189 Pneumonia, unspecified organism: Secondary | ICD-10-CM | POA: Diagnosis not present

## 2020-11-29 DIAGNOSIS — J209 Acute bronchitis, unspecified: Secondary | ICD-10-CM | POA: Diagnosis not present

## 2020-11-29 DIAGNOSIS — J9601 Acute respiratory failure with hypoxia: Secondary | ICD-10-CM | POA: Diagnosis not present

## 2020-11-29 DIAGNOSIS — I2510 Atherosclerotic heart disease of native coronary artery without angina pectoris: Secondary | ICD-10-CM | POA: Diagnosis not present

## 2020-11-29 LAB — BASIC METABOLIC PANEL
Anion gap: 6 (ref 5–15)
BUN: 24 mg/dL — ABNORMAL HIGH (ref 8–23)
CO2: 31 mmol/L (ref 22–32)
Calcium: 11 mg/dL — ABNORMAL HIGH (ref 8.9–10.3)
Chloride: 101 mmol/L (ref 98–111)
Creatinine, Ser: 1.27 mg/dL — ABNORMAL HIGH (ref 0.61–1.24)
GFR, Estimated: 60 mL/min — ABNORMAL LOW (ref >60)
Glucose, Bld: 133 mg/dL — ABNORMAL HIGH (ref 70–99)
Potassium: 3.9 mmol/L (ref 3.5–5.1)
Sodium: 138 mmol/L (ref 135–145)

## 2020-11-29 LAB — RESP PANEL BY RT-PCR (FLU A&B, COVID) ARPGX2
Influenza A by PCR: NEGATIVE
Influenza B by PCR: NEGATIVE
SARS Coronavirus 2 by RT PCR: NEGATIVE

## 2020-11-29 MED ORDER — FUROSEMIDE 40 MG PO TABS: 40.0000 mg | ORAL_TABLET | Freq: Every day | ORAL | 11 refills | Status: AC | PRN

## 2020-11-29 MED ORDER — ADULT MULTIVITAMIN W/MINERALS CH: 1.0000 | ORAL_TABLET | Freq: Every day | ORAL | Status: DC

## 2020-11-29 MED ORDER — BISACODYL 5 MG PO TBEC: 5.0000 mg | DELAYED_RELEASE_TABLET | Freq: Every day | ORAL | 0 refills | Status: DC

## 2020-11-29 MED ORDER — VITAMIN D (ERGOCALCIFEROL) 1.25 MG (50000 UNIT) PO CAPS: 50000.0000 [IU] | ORAL_CAPSULE | ORAL | Status: AC

## 2020-11-29 MED ORDER — METOPROLOL SUCCINATE ER 100 MG PO TB24
100.0000 mg | ORAL_TABLET | Freq: Every day | ORAL | Status: AC
Start: 2020-11-30 — End: ?

## 2020-11-29 MED ORDER — SENNOSIDES-DOCUSATE SODIUM 8.6-50 MG PO TABS: 2.0000 | ORAL_TABLET | Freq: Two times a day (BID) | ORAL | Status: DC

## 2020-11-29 MED ORDER — GUAIFENESIN 100 MG/5ML PO LIQD: 5.0000 mL | ORAL | 0 refills | Status: AC | PRN

## 2020-11-29 MED ORDER — FOLIC ACID 1 MG PO TABS: 1.0000 mg | ORAL_TABLET | Freq: Every day | ORAL | Status: DC

## 2020-11-29 MED ORDER — MIRTAZAPINE 15 MG PO TABS: 15.0000 mg | ORAL_TABLET | Freq: Every day | ORAL | Status: DC

## 2020-11-29 NOTE — TOC Transition Note (Signed)
Transition of Care [facility]) - CM/SW Discharge Note   Patient Details  Name: Daymeon Fischman Hao Jr. MRN: [id number] Date of Birth: 11/28/1945  Transition of Care [facility]) CM/SW Contact:  Shelbie Hutching, RN Phone Number: 11/29/2020, 12:20 PM   Clinical Narrative:    Patient is medically cleared for discharge to [facility] today.  Insurance authorization is still approved.  Repeat COVID test negative.  Patient will be going to room 804.  Once discharge has been completed RNCM will arrange EMS transport.    Final next level of care: Skilled Nursing Facility Barriers to Discharge: Barriers Resolved   Patient Goals and CMS Choice Patient states their goals for this hospitalization and ongoing recovery are:: Patient and wife agree to [facility] for rehab CMS Medicare.gov Compare Post Acute Care list provided to:: Patient Choice offered to / list presented to : Patient, Spouse  Discharge Placement   Existing PASRR number confirmed : 11/21/20          Patient chooses bed at: [facility] [place] Patient to be transferred to facility by: [facility] Name of family member notified: Sydell Axon Spare Patient and family notified of of transfer: 11/29/20  Discharge Plan and Services In-house Referral: Clinical Social Work   Post Acute Care Choice: [place]          DME Arranged: N/A DME Agency: NA       HH Arranged: NA HH Agency: NA        Social Determinants of Health (SDOH) Interventions     Readmission Risk Interventions No flowsheet data found.

## 2020-11-29 NOTE — Discharge Summary (Signed)
[name] Cross Wentling Jr. GUR:[id number] DOB: [birth year] DOA: 11/17/2020  PCP: Albina Billet, MD  Admit date: 11/17/2020 Discharge date: 11/29/2020  Admitted From: SNF Disposition:  SNF  Recommendations for Outpatient Follow-up:  Follow up with PCP in 1 week Please obtain BMP/CBC in one week Please follow up with cardiology in one week Follow up with PCCM in one week    Discharge Condition:Stable CODE STATUS:DNR  Diet recommendation: Heart Healthy /Carb modified Brief/Interim Summary: Per HPI: The patient is a 75 yr old man with history of CAD, PAF, diabetes mellitus type 2, peripheral neuropathy, hypertension and history of stroke presented ,according to his wife has intermittent episodes of confusion and disorientation at baseline. He has been increasingly short of breath with a progressively worsening cough for 4 days.In the ED he was found to have be hypertensive with SBP of 176, he was in atrial fibrillation with a HR of 92, and a RR of 20. He is saturating in the mid-nineties on 2L O2 by nasal cannula. SaO2 without O2 is not documented. CXR demonstrated a mild non-specific left lower lobe interstitial opacity which [place] represent early pneumonia. Labwork has demonstrated a normal WBC at 9.2. Procalcitonin is normal at 0.33. Potassium is low at 3.2. Albumin is low at 2.9. BNP is elevated at 331.0. There is no previous value available for comparison. Lactic acid initially was 2.3, but came down to 1.4 with IV fluids.  He is admitted for acute hypoxic respiratory failure and acute metabolic encephalopathy.  He was treated for community-acquired pneumonia and acute diastolic heart failure.  He completed his antibiotic course.  He was treated with IV Lasix.  Will need O2 supplementation to keep his O2 sats above 92%.  Will need to continue using incentive spirometer. During his hospitalization he was found with hypercalcemia which he appears to be asymptomatic.  He will need to follow-up with PCP for  further evaluation.   Acute respiratory failure with hypoxia and hypercapnia (HCC) Continue with O2 supplementation to keep O2 sats 92 on above  Treated for CHF and pneumonia  CT with trace bilateral, L>R effusions, LLL airspace opacity (atelectasis vs infection/inflammation).  See full results below completed course of abx Appears euvolemic     HFpEF exacerbation Treated with Lasix.  Lasix was discontinued due to creatinine up. Give Lasix if water weight up by 3 pounds Will need to follow-up with cardiology as outpatient    Hypercalcemia Not overtly symptomatic at this time  Treated with IV fluids  Follow PTHrp - pending Will need to follow-up with PCP for further work-up and evaluation and management    Community acquired pneumonia Completed antibiotic course with ceftriaxone and azithromycin  pulmonary was consulted - recommened RVP (not collected), fungitell (negative), legionella ab (negative), urine strep negative, histoplasma urine ag (not collected/ordered), sputum cx - pending collection, afb expectorated specimen (discussed with pulm who did not recommend isolation)  Follow-up with pulmonology as outpatient COVID SARS negative Influenza negative Placed on aspiration precautions   Toxic metabolic encephalopathy Likely related to resp failure and infection Ammonia improved on repeat B12 elevated, folate wnl, TSH wnl VBG without hypercarbia Currently at baseline Delirium precautions -     Cognitive Deficit  Hallucinations Concerning for dementia Needs outpatient neurology   Essential Tremor Needs neurology follow outpt   Suicidal Ideation Psych c/s, appreciate assistance - started mirtazepine, doesn't meet criteria for inpatient psych admission No imminent risk to himself   Deconditioning PT/OT recommending SNF   Goals of care, counseling/discussion  Palliative care consult.  Patient is DNR.  Appreciate palliative care.    Hypertension Metoprolol irbesartan   Coronary artery disease Continue statin  Continue statins and metoprolol metoprolol    Afib On Eliquis   Hyperlipidemia Continue fenofibrate   Diabetes type II (HCC) Hemoglobin A1c of 6.  Blood sugars seem to be overall  Follow-up with PCP controlled   Peripheral neuropathy On Cymbalta     Discharge Diagnoses:  Principal Problem:   Depression Active Problems:   Peripheral neuropathy   Diabetes ([place])   Hyperlipidemia   Toxic metabolic encephalopathy   Coronary artery disease   Hypertension   Acute bronchitis due to infection   Community acquired pneumonia   Acute respiratory failure with hypoxia and hypercapnia (HCC)   Goals of care, counseling/discussion    Discharge Instructions  Discharge Instructions     Call MD for:  difficulty breathing, headache or visual disturbances   Complete by: As directed    Diet - low sodium heart healthy   Complete by: As directed    Diet Carb Modified   Complete by: As directed    Discharge wound care:   Complete by: As directed    As above   Increase activity slowly   Complete by: As directed       Allergies as of 11/29/2020   No Known Allergies      Medication List     STOP taking these medications    digoxin 0.125 MG tablet Commonly known as: LANOXIN   HYDROcodone-acetaminophen 5-325 MG tablet Commonly known as: NORCO/VICODIN   Potassium 99 MG Tabs   Vitamin D-3 25 MCG (1000 UT) Caps       TAKE these medications    acetaminophen 325 MG tablet Commonly known as: TYLENOL Take 650 mg by mouth every 6 (six) hours as needed for mild pain or moderate pain.   apixaban 5 MG Tabs tablet Commonly known as: ELIQUIS Take 5 mg by mouth 2 (two) times daily.   bisacodyl 5 MG EC tablet Commonly known as: DULCOLAX Take 1 tablet (5 mg total) by mouth daily. Start taking on: November 30, 2020   DULoxetine 30 MG capsule Commonly known as: CYMBALTA Take 30 mg by  mouth 2 (two) times daily.   fenofibrate 160 MG tablet Take 160 mg by mouth daily.   folic acid 1 MG tablet Commonly known as: FOLVITE Take 1 tablet (1 mg total) by mouth daily. Start taking on: November 30, 2020   furosemide 40 MG tablet Commonly known as: Lasix Take 1 tablet (40 mg total) by mouth daily as needed for fluid (if weight up by 3 lbs).   guaiFENesin 100 MG/5ML liquid Commonly known as: ROBITUSSIN Take 5 mLs by mouth every 4 (four) hours as needed for cough or to loosen phlegm.   irbesartan 150 MG tablet Commonly known as: AVAPRO Take 150 mg by mouth daily.   metFORMIN 500 MG tablet Commonly known as: GLUCOPHAGE Take 500 mg by mouth daily.   metoprolol succinate 100 MG 24 hr tablet Commonly known as: TOPROL-XL Take 1 tablet (100 mg total) by mouth daily. Take with or immediately following a meal. Start taking on: November 30, 2020 What changed:  medication strength additional instructions   mirtazapine 15 MG tablet Commonly known as: REMERON Take 1 tablet (15 mg total) by mouth at bedtime.   multivitamin with minerals Tabs tablet Take 1 tablet by mouth daily. Start taking on: November 30, 2020   senna-docusate 8.6-50 MG tablet  Commonly known as: Senokot-S Take 2 tablets by mouth 2 (two) times daily.   simvastatin 40 MG tablet Commonly known as: ZOCOR Take 40 mg by mouth at bedtime.   Vitamin D (Ergocalciferol) 1.25 MG (50000 UNIT) Caps capsule Commonly known as: DRISDOL Take 1 capsule (50,000 Units total) by mouth every 7 (seven) days. Start taking on: December 05, 2020               Discharge Care Instructions  (From admission, onward)           Start     Ordered   11/29/20 0000  Discharge wound care:       Comments: As above   11/29/20 1046            Contact information for follow-up providers     Albina Billet, MD Follow up in 1 week(s).   Specialty: Internal Medicine Contact information: [address] [place] [address] [phone]         Erby Pian, MD Follow up in 1 week(s).   Specialty: Specialist Contact information: [place] [address] [phone]         Teodoro Spray, MD Follow up in 1 week(s).   Specialty: Cardiology Contact information: [place] [address] [phone]              Contact information for after-discharge care     Destination     [facility] Preferred SNF .   Service: Skilled Nursing Contact information: [address] [place] [phone]                    No Known Allergies  Consultations: Psychiatry PCCM  Procedures/Studies: CT ABDOMEN PELVIS WO CONTRAST  Result Date: 11/17/2020 CLINICAL DATA:  75 year old male with abdominal and pelvic pain. EXAM: CT ABDOMEN AND PELVIS WITHOUT CONTRAST TECHNIQUE: Multidetector CT imaging of the abdomen and pelvis was performed following the standard protocol without IV contrast. COMPARISON:  None. FINDINGS: Please note the parenchymal and vascular abnormalities R[place] be missed is intravenous contrast was not administered. Lower chest: Focal LEFT basilar consolidation/atelectasis noted. Hepatobiliary: The liver and gallbladder are unremarkable. No biliary dilatation. Pancreas: Unremarkable Spleen: Unremarkable Adrenals/Urinary Tract: The kidneys, adrenal glands and bladder are unremarkable except for a 7 mm nonobstructing mid RIGHT renal calculus. Stomach/Bowel: Stomach is within normal limits. Appendix appears normal. No evidence of bowel wall thickening, distention, or inflammatory changes. Vascular/Lymphatic: Aortic atherosclerosis. No enlarged abdominal or pelvic lymph nodes. Reproductive: Prostate is unremarkable. Other: No ascites, focal collection or pneumoperitoneum. Musculoskeletal: No acute or suspicious bony abnormalities are noted. Posterior fusion changes at L4-L5 noted.  IMPRESSION: 1. No evidence of acute abnormality within the abdomen or pelvis. 2. Focal LEFT basilar consolidation/atelectasis. Pneumonia is not excluded. 3. 7 mm nonobstructing RIGHT renal calculus. 4. Aortic Atherosclerosis (ICD10-I70.0). Electronically Signed   By: Margarette Canada M.D.   On: 11/17/2020 18:09   DG Chest 2 View  Result Date: 11/17/2020 CLINICAL DATA:  Cough and shortness of breath EXAM: CHEST - 2 VIEW COMPARISON:  01/17/2020 and prior studies FINDINGS: Cardiomegaly again noted. Mild LEFT LOWER lobe interstitial opacities are noted. There is no evidence of focal airspace disease, pulmonary edema, suspicious pulmonary nodule/mass, pleural effusion, or pneumothorax. No acute bony abnormalities are identified. IMPRESSION: Mild nonspecific LEFT LOWER lobe interstitial opacities which [place] represent infection. Electronically Signed   By: Cleatis Polka.D.  On: 11/17/2020 12:17   DG Shoulder Right  Result Date: 11/18/2020 CLINICAL DATA:  Right shoulder pain EXAM: RIGHT SHOULDER - 2+ VIEW COMPARISON:  None. FINDINGS: No fracture or dislocation. Moderate degenerative changes of the acromioclavicular joint. Soft tissues are normal. IMPRESSION: No acute osseous injury of the right shoulder. Electronically Signed   By: Kathreen Devoid M.D.   On: 11/18/2020 11:05   CT HEAD WO CONTRAST (5MM)  Result Date: 11/17/2020 CLINICAL DATA:  Altered mental status.  On anticoagulation. EXAM: CT HEAD WITHOUT CONTRAST TECHNIQUE: Contiguous axial images were obtained from the base of the skull through the vertex without intravenous contrast. COMPARISON:  Head CT 01/17/2020 FINDINGS: Brain: Age related atrophy. No intracranial hemorrhage, mass effect, or midline shift. No hydrocephalus. The basilar cisterns are patent. Remote lacunar infarcts in the left basal ganglia unchanged. Mild to moderate periventricular chronic small vessel ischemia. No evidence of territorial infarct or acute ischemia. No extra-axial or  intracranial fluid collection. Vascular: Atherosclerosis of skullbase vasculature without hyperdense vessel or abnormal calcification. Skull: No fracture or focal lesion. Sinuses/Orbits: Progressive mucosal thickening throughout the paranasal sinus, with subtotal opacification of all paranasal sinuses. Bubbly debris with mucosal thickening in the maxillary sinuses. No mastoid effusion. No acute orbital findings. Other: None.  Scalp soft tissues are unremarkable. IMPRESSION: 1. No acute intracranial abnormality. 2. Age related atrophy and chronic small vessel ischemia. Remote lacunar infarcts in the left basal ganglia. 3. Progressive paranasal sinus disease. Bubbly debris in the maxillary sinuses. Recommend correlation for clinical signs and symptoms of sinusitis. Electronically Signed   By: Keith Rake M.D.   On: 11/17/2020 18:05   CT CHEST WO CONTRAST  Result Date: 11/26/2020 CLINICAL DATA:  Respiratory failure EXAM: CT CHEST WITHOUT CONTRAST TECHNIQUE: Multidetector CT imaging of the chest was performed following the standard protocol without IV contrast. COMPARISON:  None. FINDINGS: Cardiovascular: Normal heart size. No significant pericardial effusion. The thoracic aorta is normal in caliber. Moderate atherosclerotic plaque of the thoracic aorta. Four-vessel coronary artery calcifications. Mediastinum/Nodes: No gross hilar adenopathy, noting limited sensitivity for the detection of hilar adenopathy on this noncontrast study. No enlarged mediastinal or axillary lymph nodes. Thyroid gland, trachea, and esophagus demonstrate no significant findings. Lungs/Pleura: Limited evaluation due to motion artifact. Left lower lobe airspace opacity. No pulmonary nodule. No pulmonary mass. Trace bilateral, left greater than right, pleural effusions. No pneumothorax. Upper Abdomen: No acute abnormality. Musculoskeletal: No chest wall abnormality. No suspicious lytic or blastic osseous lesions. No acute displaced  fracture. Old healed right rib fractures. Multilevel degenerative changes of the spine. IMPRESSION: 1. Trace bilateral, left greater than right, pleural effusions. 2. Associated left lower lobe airspace opacity that [place] represent atelectasis versus infection/inflammation. Limited evaluation on this noncontrast study. 3. Aortic Atherosclerosis (ICD10-I70.0) including four-vessel coronary calcifications. Electronically Signed   By: Iven Finn M.D.   On: 11/26/2020 21:19   CT L-SPINE NO CHARGE  Result Date: 11/17/2020 CLINICAL DATA:  Fall.  Lumbar fixation. EXAM: CT LUMBAR SPINE WITHOUT CONTRAST TECHNIQUE: Multidetector CT imaging of the lumbar spine was performed without intravenous contrast administration. Multiplanar CT image reconstructions were also generated. COMPARISON:  Lumbar spine radiographs 01/17/2020 FINDINGS: Segmentation: 5 non rib-bearing lumbar type vertebral bodies are present. The lowest fully formed vertebral body is L5. Alignment: No significant listhesis is present. Mild straightening of the normal lumbar lordosis is present. Vertebrae: Vertebral body heights are maintained. Fused anterior osteophytes are present in the lower thoracic spine to the L1 level. Fusion is noted at L4-5.  Hardware is intact. Paraspinal and other soft tissues: Atherosclerotic calcifications are present the aorta and branch vessels without aneurysm. Disc levels: T12-L1: Negative. L1-2: Mild facet hypertrophy is noted bilaterally. No significant disc protrusion or stenosis is present. L2-3: A broad-based disc protrusion is present. Moderate facet hypertrophy is noted bilaterally. No significant stenosis is present. L3-4: A broad-based disc protrusion is present. Advanced facet hypertrophy is noted. Moderate left central and foraminal narrowing is present. L4-5: Solid fusion is present. Laminectomy noted. No residual or recurrent stenosis is present. L5-S1: Mild broad-based disc bulging is present. Advanced facet  hypertrophy is noted bilaterally. Mild bilateral foraminal narrowing is present. IMPRESSION: 1. No acute trauma 2. Solid fusion at L4-5 without residual or recurrent stenosis. 3. Adjacent level disease at L3-4 with moderate left central and foraminal narrowing. 4. Mild bilateral foraminal narrowing at L5-S1. 5. DISH 6. Aortic Atherosclerosis (ICD10-I70.0). Electronically Signed   By: San Morelle M.D.   On: 11/17/2020 18:07   DG Chest Port 1 View  Result Date: 11/25/2020 CLINICAL DATA:  Hypoxia EXAM: PORTABLE CHEST 1 VIEW COMPARISON:  11/23/2020 FINDINGS: Cardiac shadow is stable. Aortic calcifications are again seen. The lungs are well aerated bilaterally. No focal infiltrate or sizable effusion is noted. Previously seen left basilar atelectasis has resolved. No bony abnormality is noted. IMPRESSION: No active disease. Electronically Signed   By: Inez Catalina M.D.   On: 11/25/2020 21:38   DG Chest Port 1 View  Result Date: 11/23/2020 CLINICAL DATA:  Hypoxia.  Altered mental status EXAM: PORTABLE CHEST 1 VIEW COMPARISON:  11/21/2020 FINDINGS: Cardiac enlargement. Negative for heart failure or edema. Mild left lower lobe airspace disease with slight progression. Possible small left effusion. Right lung clear IMPRESSION: Mild left lower lobe atelectasis infiltrate with slight progression. Electronically Signed   By: Franchot Gallo M.D.   On: 11/23/2020 13:12   DG Chest Port 1 View  Result Date: 11/21/2020 CLINICAL DATA:  Difficulty breathing EXAM: PORTABLE CHEST 1 VIEW COMPARISON:  Previous studies including the examination of 11/18/2020 FINDINGS: Transverse diameter of heart is increased. There are no signs of alveolar pulmonary edema or new focal infiltrates. There is interval decrease in interstitial markings in the parahilar regions and lower lung fields. Linear densities seen in medial left lower lung fields. Costophrenic angles are clear. There is no pneumothorax. IMPRESSION: Cardiomegaly.  There is interval decrease in interstitial markings in both lungs suggesting resolving pulmonary edema. Small linear densities in the medial left lower lung fields suggest subsegmental atelectasis. Electronically Signed   By: Elmer Picker M.D.   On: 11/21/2020 11:12   DG Chest Portable 1 View  Result Date: 11/18/2020 CLINICAL DATA:  Difficulty breathing EXAM: PORTABLE CHEST 1 VIEW COMPARISON:  11/17/2020 FINDINGS: Transverse diameter of heart is increased. Central pulmonary vessels are prominent. There is poor inspiration. Increased interstitial markings are seen in the parahilar regions and lower lung fields, more so on the left side. There is no new focal pulmonary consolidation. Lateral CP angles are clear. There is no pneumothorax. IMPRESSION: Cardiomegaly. Central pulmonary vessels are more prominent which [place] be due to poor inspiration or suggest CHF. Increased interstitial markings are seen in parahilar regions and lower lung fields, more so on the left side suggesting asymmetric pulmonary edema or underlying interstitial pneumonitis. There is no focal pulmonary consolidation or significant pleural effusion. Electronically Signed   By: Elmer Picker M.D.   On: 11/18/2020 11:06   ECHOCARDIOGRAM COMPLETE  Result Date: 11/19/2020    ECHOCARDIOGRAM REPORT  Patient Name:   Kadyn Guild Mckneely Jr. Date of Exam: 11/18/2020 Medical Rec #:  [id number]       Height:       68.0 in Accession #:    [id number]      Weight:       240.0 lb Date of Birth:  01-24-1945        BSA:          2.208 m Patient Age:    75 years        BP:           143/98 mmHg Patient Gender: M               HR:           95 bpm. Exam Location:  [facility] Procedure: 2D Echo and Intracardiac Opacification Agent Indications:     Systolic CHF  History:         Patient has no prior history of Echocardiogram examinations.                  CAD, Prior CABG; Risk Factors:Hypertension, Dyslipidemia and                  Diabetes.  Sonographer:     L  Thornton-Maynard Referring Phys:  [id number] Karie Kirks Diagnosing Phys: Serafina Royals MD  Sonographer Comments: Suboptimal apical window. IMPRESSIONS  1. Left ventricular ejection fraction, by estimation, is 60 to 65%. The left ventricle has normal function. The left ventricle has no regional wall motion abnormalities. Left ventricular diastolic parameters were normal.  2. Right ventricular systolic function is normal. The right ventricular size is normal. There is mildly elevated pulmonary artery systolic pressure.  3. The mitral valve is normal in structure. Trivial mitral valve regurgitation.  4. The aortic valve is normal in structure. Aortic valve regurgitation is not visualized. FINDINGS  Left Ventricle: Left ventricular ejection fraction, by estimation, is 60 to 65%. The left ventricle has normal function. The left ventricle has no regional wall motion abnormalities. Definity contrast agent was given IV to delineate the left ventricular  endocardial borders. The left ventricular internal cavity size was normal in size. There is no left ventricular hypertrophy. Left ventricular diastolic parameters were normal. Right Ventricle: The right ventricular size is normal. No increase in right ventricular wall thickness. Right ventricular systolic function is normal. There is mildly elevated pulmonary artery systolic pressure. The tricuspid regurgitant velocity is 2.88  m/s, and with an assumed right atrial pressure of 3 mmHg, the estimated right ventricular systolic pressure is 18.5 mmHg. Left Atrium: Left atrial size was normal in size. Right Atrium: Right atrial size was normal in size. Pericardium: There is no evidence of pericardial effusion. Mitral Valve: The mitral valve is normal in structure. Trivial mitral valve regurgitation. Tricuspid Valve: The tricuspid valve is normal in structure. Tricuspid valve regurgitation is trivial. Aortic Valve: The aortic valve is normal in structure. Aortic valve regurgitation is  not visualized. Aortic valve mean gradient measures 3.0 mmHg. Aortic valve peak gradient measures 4.9 mmHg. Aortic valve area, by VTI measures 2.26 cm. Pulmonic Valve: The pulmonic valve was normal in structure. Pulmonic valve regurgitation is not visualized. Aorta: The aortic root and ascending aorta are structurally normal, with no evidence of dilitation. IAS/Shunts: No atrial level shunt detected by color flow Doppler.  LEFT VENTRICLE PLAX 2D LVIDd:         3.80 cm     Diastology LVIDs:  2.35 cm     LV e' medial:    8.16 cm/s LV PW:         1.40 cm     LV E/e' medial:  10.0 LV IVS:        1.40 cm     LV e' lateral:   8.70 cm/s LVOT diam:     2.20 cm     LV E/e' lateral: 9.4 LV SV:         46 LV SV Index:   21 LVOT Area:     3.80 cm  LV Volumes (MOD) LV vol d, MOD A2C: 87.4 ml LV vol d, MOD A4C: 83.1 ml LV vol s, MOD A2C: 15.3 ml LV vol s, MOD A4C: 21.7 ml LV SV MOD A2C:     72.1 ml LV SV MOD A4C:     83.1 ml LV SV MOD BP:      67.0 ml RIGHT VENTRICLE RV S prime:     8.38 cm/s LEFT ATRIUM             Index LA diam:        4.50 cm 2.04 cm/m LA Vol (A2C):   79.2 ml 35.86 ml/m LA Vol (A4C):   57.0 ml 25.81 ml/m LA Biplane Vol: 67.2 ml 30.43 ml/m  AORTIC VALVE                    PULMONIC VALVE AV Area (Vmax):    2.33 cm     PV Vmax:       0.84 m/s AV Area (Vmean):   2.42 cm     PV Peak grad:  2.8 mmHg AV Area (VTI):     2.26 cm AV Vmax:           111.00 cm/s AV Vmean:          76.400 cm/s AV VTI:            0.202 m AV Peak Grad:      4.9 mmHg AV Mean Grad:      3.0 mmHg LVOT Vmax:         67.90 cm/s LVOT Vmean:        48.700 cm/s LVOT VTI:          0.120 m LVOT/AV VTI ratio: 0.59  AORTA Ao Root diam: 2.80 cm Ao Asc diam:  3.60 cm MITRAL VALVE               TRICUSPID VALVE MV Area (PHT): 3.72 cm    TR Peak grad:   33.2 mmHg MV Decel Time: 204 msec    TR Vmax:        288.00 cm/s MV E velocity: 81.40 cm/s                            SHUNTS                            Systemic VTI:  0.12 m                             Systemic Diam: 2.20 cm Serafina Royals MD Electronically signed by Serafina Royals MD Signature Date/Time: 11/19/2020/8:52:08 AM    Final       Subjective: No sob, cp, or dizziness  Discharge Exam: Vitals:   11/29/20 0824 11/29/20 1152  BP:  107/78  Pulse:  71  Resp:  20  Temp:  98.1 F (36.7 C)  SpO2: 97% 96%   Vitals:   11/29/20 0501 11/29/20 0758 11/29/20 0824 11/29/20 1152  BP: 138/77 (!) 170/77  107/78  Pulse: (!) 56 62  71  Resp: 18 20  20   Temp: (!) 97.3 F (36.3 C) 98.2 F (36.8 C)  98.1 F (36.7 C)  TempSrc:      SpO2: 99% 98% 97% 96%  Weight:      Height:        General: Pt is alert, awake, not in acute distress Cardiovascular: RRR, S1/S2 +, no rubs, no gallops Respiratory: CTA bilaterally, no wheezing, no rhonchi Abdominal: Soft, NT, ND, bowel sounds + Extremities: no edema    The results of significant diagnostics from this hospitalization (including imaging, microbiology, ancillary and laboratory) are listed below for reference.     Microbiology: Recent Results (from the past 240 hour(s))  Resp Panel by RT-PCR (Flu A&B, Covid) Nasopharyngeal Swab     Status: None   Collection Time: 11/29/20 11:24 AM   Specimen: Nasopharyngeal Swab; Nasopharyngeal(NP) swabs in vial transport medium  Result Value Ref Range Status   SARS Coronavirus 2 by RT PCR NEGATIVE NEGATIVE Final    Comment: (NOTE) SARS-CoV-2 target nucleic acids are NOT DETECTED.  The SARS-CoV-2 RNA is generally detectable in upper respiratory specimens during the acute phase of infection. The lowest concentration of SARS-CoV-2 viral copies this assay can detect is 138 copies/mL. A negative result does not preclude SARS-Cov-2 infection and should not be used as the sole basis for treatment or other patient management decisions. A negative result [place] occur with  improper specimen collection/handling, submission of specimen other than nasopharyngeal swab, presence of viral mutation(s)  within the areas targeted by this assay, and inadequate number of viral copies(<138 copies/mL). A negative result must be combined with clinical observations, patient history, and epidemiological information. The expected result is Negative.  Fact Sheet for Patients:  EntrepreneurPulse.com.au  Fact Sheet for Healthcare Providers:  IncredibleEmployment.be  This test is no t yet approved or cleared by the [place] FDA and  has been authorized for detection and/or diagnosis of SARS-CoV-2 by FDA under an Emergency Use Authorization (EUA). This EUA will remain  in effect (meaning this test can be used) for the duration of the COVID-19 declaration under Section 564(b)(1) of the Act, 21 U.S.C.section 360bbb-3(b)(1), unless the authorization is terminated  or revoked sooner.       Influenza A by PCR NEGATIVE NEGATIVE Final   Influenza B by PCR NEGATIVE NEGATIVE Final    Comment: (NOTE) The Xpert Xpress SARS-CoV-2/FLU/RSV plus assay is intended as an aid in the diagnosis of influenza from Nasopharyngeal swab specimens and should not be used as a sole basis for treatment. Nasal washings and aspirates are unacceptable for Xpert Xpress SARS-CoV-2/FLU/RSV testing.  Fact Sheet for Patients: EntrepreneurPulse.com.au  Fact Sheet for Healthcare Providers: IncredibleEmployment.be  This test is not yet approved or cleared by the [place] FDA and has been authorized for detection and/or diagnosis of SARS-CoV-2 by FDA under an Emergency Use Authorization (EUA). This EUA will remain in effect (meaning this test can be used) for the duration of the COVID-19 declaration under Section 564(b)(1) of the Act, 21 U.S.C. section 360bbb-3(b)(1), unless the authorization is terminated or revoked.  Performed at [facility], [address] [place], [place] [address]      Labs: BNP (last 3 results) Recent  Labs    11/18/20 0624 11/24/20 0413 11/27/20 0808  BNP 421.5* 155.1* 409.7*   Basic Metabolic Panel: Recent Labs  Lab 11/23/20 0644 11/23/20 1534 11/24/20 0413 11/25/20 0407 11/26/20 0400 11/27/20 0808 11/28/20 0541 11/29/20 0839  NA 139   < > 138 139 140 139 138 138  K 3.9   < > 3.8 3.7 3.9 4.5 3.5 3.9  CL 98   < > 101 105 105 101 99 101  CO2 34*   < > 32 29 30 32 32 31  GLUCOSE 119*   < > 145* 133* 131* 111* 125* 133*  BUN 21   < > 20 18 20  24* 27* 24*  CREATININE 1.22   < > 1.37* 1.12 1.21 1.41* 1.43* 1.27*  CALCIUM 11.2*   < > 11.1* 10.8* 12.1* 11.3* 11.3* 11.0*  MG 1.5*  --  1.9 1.5* 1.7 1.4* 1.9  --   PHOS 4.0  --  3.4 3.7 4.0  --  4.5  --    < > = values in this interval not displayed.   Liver Function Tests: Recent Labs  Lab 11/24/20 0413 11/25/20 0407 11/26/20 0400 11/27/20 0808 11/28/20 0541  AST 19 18 22 25 21   ALT 16 17 18 20 18   ALKPHOS 27* 23* 26* 25* 26*  BILITOT 0.7 0.8 0.6 0.8 0.7  PROT 5.9* 5.6* 6.4* 6.5 6.5  ALBUMIN 2.8* 2.6* 2.9* 3.3* 3.2*   No results for input(s): LIPASE, AMYLASE in the last 168 hours. No results for input(s): AMMONIA in the last 168 hours. CBC: Recent Labs  Lab 11/24/20 0413 11/25/20 0407 11/26/20 0400 11/27/20 0808 11/28/20 0541  WBC 4.2 3.6* 4.9 4.7 4.9  NEUTROABS 2.2 1.7 2.6 2.4 2.3  HGB 12.4* 11.8* 12.3* 12.8* 12.4*  HCT 37.4* 35.5* 36.7* 38.5* 37.4*  MCV 100.5* 100.6* 100.0 98.7 98.9  PLT 133* 127* 137* 138* 135*   Cardiac Enzymes: No results for input(s): CKTOTAL, CKMB, CKMBINDEX, TROPONINI in the last 168 hours. BNP: Invalid input(s): POCBNP CBG: Recent Labs  Lab 11/24/20 1706 11/26/20 0833  GLUCAP 156* 160*   D-Dimer No results for input(s): DDIMER in the last 72 hours. Hgb A1c No results for input(s): HGBA1C in the last 72 hours. Lipid Profile No results for input(s): CHOL, HDL, LDLCALC, TRIG, CHOLHDL, LDLDIRECT in the last 72 hours. Thyroid function studies No results for input(s): TSH,  T4TOTAL, T3FREE, THYROIDAB in the last 72 hours.  Invalid input(s): FREET3 Anemia work up No results for input(s): VITAMINB12, FOLATE, FERRITIN, TIBC, IRON, RETICCTPCT in the last 72 hours. Urinalysis    Component Value Date/Time   COLORURINE YELLOW (A) 11/17/2020 1233   APPEARANCEUR HAZY (A) 11/17/2020 1233   LABSPEC 1.015 11/17/2020 1233   PHURINE 6.0 11/17/2020 1233   GLUCOSEU NEGATIVE 11/17/2020 1233   HGBUR MODERATE (A) 11/17/2020 1233   BILIRUBINUR NEGATIVE 11/17/2020 1233   KETONESUR NEGATIVE 11/17/2020 1233   PROTEINUR 30 (A) 11/17/2020 1233   UROBILINOGEN 0.2 12/04/2011 0430   NITRITE NEGATIVE 11/17/2020 1233   LEUKOCYTESUR NEGATIVE 11/17/2020 1233   Sepsis Labs Invalid input(s): PROCALCITONIN,  WBC,  LACTICIDVEN Microbiology Recent Results (from the past 240 hour(s))  Resp Panel by RT-PCR (Flu A&B, Covid) Nasopharyngeal Swab     Status: None   Collection Time: 11/29/20 11:24 AM   Specimen: Nasopharyngeal Swab; Nasopharyngeal(NP) swabs in vial transport medium  Result Value Ref Range Status   SARS Coronavirus 2 by RT PCR NEGATIVE NEGATIVE Final    Comment: (NOTE) SARS-CoV-2  target nucleic acids are NOT DETECTED.  The SARS-CoV-2 RNA is generally detectable in upper respiratory specimens during the acute phase of infection. The lowest concentration of SARS-CoV-2 viral copies this assay can detect is 138 copies/mL. A negative result does not preclude SARS-Cov-2 infection and should not be used as the sole basis for treatment or other patient management decisions. A negative result Hay[place] occur with  improper specimen collection/handling, submission of specimen other than nasopharyngeal swab, presence of viral mutation(s) within the areas targeted by this assay, and inadequate number of viral copies(<138 copies/mL). A negative result must be combined with clinical observations, patient history, and epidemiological information. The expected result is Negative.  Fact  Sheet for Patients:  EntrepreneurPulse.com.au  Fact Sheet for Healthcare Providers:  IncredibleEmployment.be  This test is no t yet approved or cleared by the [place] FDA and  has been authorized for detection and/or diagnosis of SARS-CoV-2 by FDA under an Emergency Use Authorization (EUA). This EUA will remain  in effect (meaning this test can be used) for the duration of the COVID-19 declaration under Section 564(b)(1) of the Act, 21 U.S.C.section 360bbb-3(b)(1), unless the authorization is terminated  or revoked sooner.       Influenza A by PCR NEGATIVE NEGATIVE Final   Influenza B by PCR NEGATIVE NEGATIVE Final    Comment: (NOTE) The Xpert Xpress SARS-CoV-2/FLU/RSV plus assay is intended as an aid in the diagnosis of influenza from Nasopharyngeal swab specimens and should not be used as a sole basis for treatment. Nasal washings and aspirates are unacceptable for Xpert Xpress SARS-CoV-2/FLU/RSV testing.  Fact Sheet for Patients: EntrepreneurPulse.com.au  Fact Sheet for Healthcare Providers: IncredibleEmployment.be  This test is not yet approved or cleared by the [place] FDA and has been authorized for detection and/or diagnosis of SARS-CoV-2 by FDA under an Emergency Use Authorization (EUA). This EUA will remain in effect (meaning this test can be used) for the duration of the COVID-19 declaration under Section 564(b)(1) of the Act, 21 U.S.C. section 360bbb-3(b)(1), unless the authorization is terminated or revoked.  Performed at [facility], [address] [place], [place] [address]      Time coordinating discharge: Over 30 minutes  SIGNED:   Nolberto Hanlon, MD  [facility] 11/29/2020, 12:21 PM Pager   If 7PM-7AM, please contact night-coverage [email or web address] Password [email or web address]

## 2020-11-29 NOTE — TOC Progression Note (Signed)
Transition of Care (TOC) - Progression Note    Patient Details  Name: [name] Canepa Glazer Jr. MRN: [id number] Date of Birth: 1945/10/28  Transition of Care [facility]) CM/SW Contact  Shelbie Hutching, RN Phone Number: 11/29/2020, 12:41 PM  Clinical Narrative:    EMS has been arranged with [facility]- patient is 3rd on the list for pick up.   Expected Discharge Plan: [place] Barriers to Discharge: Barriers Resolved  Expected Discharge Plan and Services Expected Discharge Plan: [place] In-house Referral: Clinical Social Work   Post Acute Care Choice: [place] Living arrangements for the past 2 months: [place] Expected Discharge Date: 11/29/20               DME Arranged: N/A DME Agency: NA       HH Arranged: NA HH Agency: NA         Social Determinants of Health (SDOH) Interventions    Readmission Risk Interventions No flowsheet data found.

## 2020-12-01 LAB — PTH-RELATED PEPTIDE: PTH-related peptide: 2.9 pmol/L

## 2021-03-12 ENCOUNTER — Telehealth: Payer: Self-pay

## 2021-03-12 NOTE — Telephone Encounter (Signed)
Left vm to confirm 03/14/21 appointment-Toni

## 2021-03-14 ENCOUNTER — Ambulatory Visit: Payer: Medicare HMO | Admitting: Internal Medicine

## 2021-03-19 ENCOUNTER — Other Ambulatory Visit
Admission: RE | Admit: 2021-03-19 | Discharge: 2021-03-19 | Disposition: A | Discharge status: Home / Self Care | Payer: Medicare HMO | Source: Ambulatory Visit | Attending: Cardiology | Admitting: Cardiology

## 2021-03-19 DIAGNOSIS — I25810 Atherosclerosis of coronary artery bypass graft(s) without angina pectoris: Secondary | ICD-10-CM | POA: Diagnosis present

## 2021-03-19 DIAGNOSIS — J9602 Acute respiratory failure with hypercapnia: Secondary | ICD-10-CM | POA: Insufficient documentation

## 2021-03-19 DIAGNOSIS — I5030 Unspecified diastolic (congestive) heart failure: Secondary | ICD-10-CM | POA: Insufficient documentation

## 2021-03-19 DIAGNOSIS — J9601 Acute respiratory failure with hypoxia: Secondary | ICD-10-CM | POA: Insufficient documentation

## 2021-03-19 DIAGNOSIS — R0602 Shortness of breath: Secondary | ICD-10-CM | POA: Diagnosis present

## 2021-03-19 LAB — BRAIN NATRIURETIC PEPTIDE: B Natriuretic Peptide: 207.7 pg/mL — ABNORMAL HIGH (ref 0.0–100.0)

## 2021-03-25 ENCOUNTER — Telehealth: Payer: Self-pay

## 2021-03-25 NOTE — Telephone Encounter (Signed)
Left vm to confirm 03/28/21 appointment-Toni ?

## 2021-03-28 ENCOUNTER — Telehealth: Payer: Self-pay

## 2021-03-28 ENCOUNTER — Ambulatory Visit: Payer: Medicare HMO | Admitting: Internal Medicine

## 2021-03-28 ENCOUNTER — Encounter: Payer: Self-pay | Admitting: Internal Medicine

## 2021-03-28 ENCOUNTER — Other Ambulatory Visit: Payer: Self-pay

## 2021-03-28 VITALS — BP 180/89 | HR 105 | Temp 98.8°F | Resp 16 | Ht 71.0 in | Wt 226.2 lb

## 2021-03-28 DIAGNOSIS — I2510 Atherosclerotic heart disease of native coronary artery without angina pectoris: Secondary | ICD-10-CM

## 2021-03-28 DIAGNOSIS — J449 Chronic obstructive pulmonary disease, unspecified: Secondary | ICD-10-CM

## 2021-03-28 DIAGNOSIS — I6930 Unspecified sequelae of cerebral infarction: Secondary | ICD-10-CM | POA: Diagnosis not present

## 2021-03-28 DIAGNOSIS — R0602 Shortness of breath: Secondary | ICD-10-CM

## 2021-03-28 DIAGNOSIS — I2584 Coronary atherosclerosis due to calcified coronary lesion: Secondary | ICD-10-CM

## 2021-03-28 DIAGNOSIS — I25810 Atherosclerosis of coronary artery bypass graft(s) without angina pectoris: Secondary | ICD-10-CM | POA: Diagnosis not present

## 2021-03-28 DIAGNOSIS — G4719 Other hypersomnia: Secondary | ICD-10-CM

## 2021-03-28 NOTE — Progress Notes (Unsigned)
[facility] [address] [place], [place] [address]  Pulmonary Sleep Medicine   Office Visit Note  Patient Name: [name] Brier Abend Jr. DOB: September 06, 1945 MRN [id number]  Date of Service: 03/28/2021  Complaints/HPI: patient was in hte hospital back in October with pneumnonia. Patient was placed on oxygen at that time. Patient states he wants to know if he can come off the oxygen. He states that he has some SOB cough and congestion. CT chest showed presence of pl effusion and pneumonia. He did go to rehab after the admission but he had been discharged and is not quite back to baseline. States he quit smoking in 2008. No recent PFT on file. He states prior to pneumonia he states he was not very active had some balance problems. Wife notes there is some memory issues also  ROS  General: (-) fever, (-) chills, (-) night sweats, (-) weakness Skin: (-) rashes, (-) itching,. Eyes: (-) visual changes, (-) redness, (-) itching. Nose and Sinuses: (-) nasal stuffiness or itchiness, (-) postnasal drip, (-) nosebleeds, (-) sinus trouble. Mouth and Throat: (-) sore throat, (-) hoarseness. Neck: (-) swollen glands, (-) enlarged thyroid, (-) neck pain. Respiratory: + cough, (-) bloody sputum, + shortness of breath, - wheezing. Cardiovascular: + ankle swelling, (-) chest pain. Lymphatic: (-) lymph node enlargement. Neurologic: (-) numbness, (-) tingling. Psychiatric: (-) anxiety, (-) depression   Current Medication: Outpatient Encounter Medications as of 03/28/2021  Medication Sig   acetaminophen (TYLENOL) 325 MG tablet Take 650 mg by mouth every 6 (six) hours as needed for mild pain or moderate pain.   albuterol (VENTOLIN HFA) 108 (90 Base) MCG/ACT inhaler Inhale into the lungs every 6 (six) hours as needed for wheezing or shortness of breath.   apixaban (ELIQUIS) 5 MG TABS tablet Take 5 mg by mouth 2 (two) times daily.   DULoxetine (CYMBALTA) 30 MG capsule Take 30 mg by mouth 2 (two) times daily.    fenofibrate 160 MG tablet Take 160 mg by mouth daily.   furosemide (LASIX) 40 MG tablet Take 1 tablet (40 mg total) by mouth daily as needed for fluid (if weight up by 3 lbs).   guaiFENesin (ROBITUSSIN) 100 MG/5ML liquid Take 5 mLs by mouth every 4 (four) hours as needed for cough or to loosen phlegm.   HYDROcodone-acetaminophen (NORCO/VICODIN) 5-325 MG tablet Take 1 tablet by mouth every 6 (six) hours as needed for moderate pain.   irbesartan (AVAPRO) 150 MG tablet Take 150 mg by mouth daily.   metFORMIN (GLUCOPHAGE) 500 MG tablet Take 500 mg by mouth daily.   metoprolol succinate (TOPROL-XL) 100 MG 24 hr tablet Take 1 tablet (100 mg total) by mouth daily. Take with or immediately following a meal.   simvastatin (ZOCOR) 40 MG tablet Take 40 mg by mouth at bedtime.   Vitamin D, Ergocalciferol, (DRISDOL) 1.25 MG (50000 UNIT) CAPS capsule Take 1 capsule (50,000 Units total) by mouth every 7 (seven) days.   [DISCONTINUED] bisacodyl (DULCOLAX) 5 MG EC tablet Take 1 tablet (5 mg total) by mouth daily. (Patient not taking: Reported on 03/28/2021)   [DISCONTINUED] folic acid (FOLVITE) 1 MG tablet Take 1 tablet (1 mg total) by mouth daily. (Patient not taking: Reported on 03/28/2021)   [DISCONTINUED] mirtazapine (REMERON) 15 MG tablet Take 1 tablet (15 mg total) by mouth at bedtime. (Patient not taking: Reported on 03/28/2021)   [DISCONTINUED] Multiple Vitamin (MULTIVITAMIN WITH MINERALS) TABS tablet Take 1 tablet by mouth daily. (Patient not taking: Reported on 03/28/2021)   [  DISCONTINUED] senna-docusate (SENOKOT-S) 8.6-50 MG tablet Take 2 tablets by mouth 2 (two) times daily. (Patient not taking: Reported on 03/28/2021)   No facility-administered encounter medications on file as of 03/28/2021.    Surgical History: Past Surgical History:  Procedure Laterality Date   BACK SURGERY     CARDIAC CATHETERIZATION     COLONOSCOPY WITH PROPOFOL N/A 11/10/2018   Procedure: COLONOSCOPY WITH PROPOFOL;  Surgeon: Robert Bellow, MD;  Location: [facility] ENDOSCOPY;  Service: Endoscopy;  Laterality: N/A;   CORONARY ARTERY BYPASS GRAFT     (2010 @ [place]     ?? BLEPHOPLASTY   HAND SURGERY     CRUSHING INJURY---1966  HAS ABOUT 6 SURGERIES THEN   HAND SURGERY Right    HERNIA REPAIR     BILATERAL   LUMBAR FUSION  11/25/2011   POSTERIOR    Medical History: Past Medical History:  Diagnosis Date   Arthritis    Coronary artery disease    Depression    STARTED AFTER  OPEN HEART SURG.   Diabetes mellitus without complication ([place])    Borderline   Foot pain, bilateral    Gout    Hypertension    Insomnia 10/27/2012   Lumbosacral spinal stenosis    Myocardial infarction ([place])    Neuropathy    FIBRO        SEES  DR. Raliegh Ip  WILLIS   Restless leg syndrome    Stroke [facility])    2008  RIGHT SIDE...HAD FOR A COUPLE OF DAYS AND THEN WENT AWAY   Stroke ([place])    WENT TO [place]  2008    Family History: Family History  Problem Relation Age of Onset   Diabetes Mother     Social History: Social History   Socioeconomic History   Marital status: Married    Spouse name: Not on file   Number of children: Not on file   Years of education: Not on file   Highest education level: Not on file  Occupational History   Not on file  Tobacco Use   Smoking status: Former    Packs/day: 1.00    Years: 30.00    Pack years: 30.00    Types: Cigarettes    Start date: 01/21/1968    Quit date: 01/20/2006    Years since quitting: 15.1   Smokeless tobacco: Former    Quit date: 01/25/2006  Vaping Use   Vaping Use: Never used  Substance and Sexual Activity   Alcohol use: Yes    Alcohol/week: 12.0 standard drinks    Types: 4 Glasses of wine, 8 Shots of liquor per week    Comment: occasional   Drug use: No   Sexual activity: Not on file  Other Topics Concern   Not on file  Social History Narrative   Not on file   Social Determinants of Health   Financial Resource Strain: Not on file  Food Insecurity: Not  on file  Transportation Needs: Not on file  Physical Activity: Not on file  Stress: Not on file  Social Connections: Not on file  Intimate Partner Violence: Not on file    Vital Signs: Blood pressure (!) 180/89, pulse (!) 105, temperature 98.8 F (37.1 C), resp. rate 16, height '5\' 11"'$  (1.803 m), weight 226 lb 3.2 oz (102.6 kg), SpO2 (!) 86 %.  Examination: General Appearance: The patient is well-developed, well-nourished, and in no distress. Skin: Gross inspection of skin unremarkable. Head: normocephalic, no gross  deformities. Eyes: no gross deformities noted. ENT: ears appear grossly normal no exudates. Neck: Supple. No thyromegaly. No LAD. Respiratory: few rhonchi noted. Cardiovascular: Normal S1 and S2 without murmur or rub. Extremities: No cyanosis. pulses are equal. Neurologic: Alert and oriented. No involuntary movements.  LABS: Recent Results (from the past 2160 hour(s))  Brain natriuretic peptide     Status: Abnormal   Collection Time: 03/19/21 10:05 AM  Result Value Ref Range   B Natriuretic Peptide 207.7 (H) 0.0 - 100.0 pg/mL    Comment: Performed at [facility], [address], [place], [place] [address]    Radiology: No results found.  No results found.  No results found.    Assessment and Plan: Patient Active Problem List   Diagnosis Date Noted   Depression 11/21/2020   Goals of care, counseling/discussion 11/20/2020   Acute respiratory failure with hypoxia and hypercapnia ([place]) 19/75/8832   Toxic metabolic encephalopathy 54/98/2641   Coronary artery disease    Hypertension    Acute bronchitis due to infection    Community acquired pneumonia    Diabetes ([place]) 01/08/2016   Hyperlipidemia 01/08/2016   Pain in limb 01/08/2016   Insomnia 10/27/2012   Peripheral neuropathy 03/09/2012   Lumbar stenosis 12/04/2011    1. Obstructive chronic bronchitis without exacerbation (HCC) ***  2. Shortness of breath *** - Pulmonary function  test; Future - ECHOCARDIOGRAM COMPLETE; Future - CT Chest Wo Contrast; Future  3. Chronic arterial ischemic stroke ***  4. Coronary artery disease due to calcified coronary lesion ***  5. 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 Other hypersomnia *** - PSG Sleep Study; Future   General Counseling: I have discussed the findings of the evaluation and examination with [name] [name].  I have also discussed any further diagnostic evaluation thatmay be needed or ordered today. [name] [name] verbalizes understanding of the findings of todays visit. We also reviewed his medications today and discussed drug interactions and side effects including but not limited excessive drowsiness and altered mental states. We also discussed that there is always a risk not just to him but also people around him. he has been encouraged to call the office with any questions or concerns that should arise related to todays visit.  No orders of the defined types were placed in this encounter.    Time spent: ***  I have personally obtained a history, examined the patient, evaluated laboratory and imaging results, formulated the assessment and plan and placed orders.    Allyne Gee, MD [facility] Pulmonary and Critical Care Sleep medicine

## 2021-03-28 NOTE — Telephone Encounter (Signed)
Chest CT and SS ordered. Printed. Gave to Titania-Toni ?

## 2021-03-28 NOTE — Patient Instructions (Signed)
Chronic Obstructive Pulmonary Disease Chronic obstructive pulmonary disease (COPD) is a long-term (chronic) lung problem. When you have COPD, it is hard for air to get in and out of your lungs. Usually the condition gets worse over time, and your lungs will never return to normal. There are things you can do to keep yourself as healthy as possible. What are the causes? Smoking. This is the most common cause. Certain genes passed from parent to child (inherited). What increases the risk? Being exposed to secondhand smoke from cigarettes, pipes, or cigars. Being exposed to chemicals and other irritants, such as fumes and dust in the work environment. Having chronic lung conditions or infections. What are the signs or symptoms? Shortness of breath, especially during physical activity. A long-term cough with a large amount of thick mucus. Sometimes, the cough Yochim not have any mucus (dry cough). Wheezing. Breathing quickly. Skin that looks gray or blue, especially in the fingers, toes, or lips. Feeling tired (fatigue). Weight loss. Chest tightness. Having infections often. Episodes when breathing symptoms become much worse (exacerbations). At the later stages of this disease, you Bischof have swelling in the ankles, feet, or legs. How is this treated? Taking medicines. Quitting smoking, if you smoke. Rehabilitation. This includes steps to make your body work better. It Hubbs involve a team of specialists. Doing exercises. Making changes to your diet. Using oxygen. Lung surgery. Lung transplant. Comfort measures (palliative care). Follow these instructions at home: Medicines Take over-the-counter and prescription medicines only as told by your doctor. Talk to your doctor before taking any cough or allergy medicines. You [place]right need to avoid medicines that cause your lungs to be dry. Lifestyle If you smoke, stop smoking. Smoking makes the problem worse. Do not smoke or use any products that  contain nicotine or tobacco. If you need help quitting, ask your doctor. Avoid being around things that make your breathing worse. This Borror include smoke, chemicals, and fumes. Stay active, but remember to rest as well. Learn and use tips on how to manage stress and control your breathing. Make sure you get enough sleep. Most adults need at least 7 hours of sleep every night. Eat healthy foods. Eat smaller meals more often. Rest before meals. Controlled breathing Learn and use tips on how to control your breathing as told by your doctor. Try: Breathing in (inhaling) through your nose for 1 second. Then, pucker your lips and breath out (exhale) through your lips for 2 seconds. Putting one hand on your belly (abdomen). Breathe in slowly through your nose for 1 second. Your hand on your belly should move out. Pucker your lips and breathe out slowly through your lips. Your hand on your belly should move in as you breathe out.  Controlled coughing Learn and use controlled coughing to clear mucus from your lungs. Follow these steps: Lean your head a little forward. Breathe in deeply. Try to hold your breath for 3 seconds. Keep your mouth slightly open while coughing 2 times. Spit any mucus out into a tissue. Rest and do the steps again 1 or 2 times as needed. General instructions Make sure you get all the shots (vaccines) that your doctor recommends. Ask your doctor about a flu shot and a pneumonia shot. Use oxygen therapy and pulmonary rehabilitation if told by your doctor. If you need home oxygen therapy, ask your doctor if you should buy a tool to measure your oxygen level (oximeter). Make a COPD action plan with your doctor. This helps you to  know what to do if you feel worse than usual. Manage any other conditions you have as told by your doctor. Avoid going outside when it is very hot, cold, or humid. Avoid people who have a sickness you can catch (contagious). Keep all follow-up  visits. Contact a doctor if: You cough up more mucus than usual. There is a change in the color or thickness of the mucus. It is harder to breathe than usual. Your breathing is faster than usual. You have trouble sleeping. You need to use your medicines more often than usual. You have trouble doing your normal activities such as getting dressed or walking around the house. Get help right away if: You have shortness of breath while resting. You have shortness of breath that stops you from: Being able to talk. Doing normal activities. Your chest hurts for longer than 5 minutes. Your skin color is more blue than usual. Your pulse oximeter shows that you have low oxygen for longer than 5 minutes. You have a fever. You feel too tired to breathe normally. These symptoms [place] represent a serious problem that is an emergency. Do not wait to see if the symptoms will go away. Get medical help right away. Call your local emergency services (911 in the [place] Do not drive yourself to the hospital. Summary Chronic obstructive pulmonary disease (COPD) is a long-term lung problem. The way your lungs work will never return to normal. Usually the condition gets worse over time. There are things you can do to keep yourself as healthy as possible. Take over-the-counter and prescription medicines only as told by your doctor. If you smoke, stop. Smoking makes the problem worse. This information is not intended to replace advice given to you by your health care provider. Make sure you discuss any questions you have with your health care provider. Document Revised: 11/15/2019 Document Reviewed: 11/15/2019 Elsevier Patient Education  [place]. Sleep Apnea Sleep apnea affects breathing during sleep. It causes breathing to stop for 10 seconds or more, or to become shallow. People with sleep apnea usually snore loudly. It can also increase the risk of: Heart attack. Stroke. Being very overweight  (obese). Diabetes. Heart failure. Irregular heartbeat. High blood pressure. The goal of treatment is to help you breathe normally again. What are the causes? The most common cause of this condition is a collapsed or blocked airway. There are three kinds of sleep apnea: Obstructive sleep apnea. This is caused by a blocked or collapsed airway. Central sleep apnea. This happens when the brain does not send the right signals to the muscles that control breathing. Mixed sleep apnea. This is a combination of obstructive and central sleep apnea. What increases the risk? Being overweight. Smoking. Having a small airway. Being older. Being male. Drinking alcohol. Taking medicines to calm yourself (sedatives or tranquilizers). Having family members with the condition. Having a tongue or tonsils that are larger than normal. What are the signs or symptoms? Trouble staying asleep. Loud snoring. Headaches in the morning. Waking up gasping. Dry mouth or sore throat in the morning. Being sleepy or tired during the day. If you are sleepy or tired during the day, you Closs also: Not be able to focus your mind (concentrate). Forget things. Get angry a lot and have mood swings. Feel sad (depressed). Have changes in your personality. Have less interest in sex, if you are male. Be unable to have an erection, if you are male. How is this treated?  Sleeping on your side.  Using a medicine to get rid of mucus in your nose (decongestant). Avoiding the use of alcohol, medicines to help you relax, or certain pain medicines (narcotics). Losing weight, if needed. Changing your diet. Quitting smoking. Using a machine to open your airway while you sleep, such as: An oral appliance. This is a mouthpiece that shifts your lower jaw forward. A CPAP device. This device blows air through a mask when you breathe out (exhale). An EPAP device. This has valves that you put in each nostril. A BIPAP device. This  device blows air through a mask when you breathe in (inhale) and breathe out. Having surgery if other treatments do not work. Follow these instructions at home: Lifestyle Make changes that your doctor recommends. Eat a healthy diet. Lose weight if needed. Avoid alcohol, medicines to help you relax, and some pain medicines. Do not smoke or use any products that contain nicotine or tobacco. If you need help quitting, ask your doctor. General instructions Take over-the-counter and prescription medicines only as told by your doctor. If you were given a machine to use while you sleep, use it only as told by your doctor. If you are having surgery, make sure to tell your doctor you have sleep apnea. You [place] need to bring your device with you. Keep all follow-up visits. Contact a doctor if: The machine that you were given to use during sleep bothers you or does not seem to be working. You do not get better. You get worse. Get help right away if: Your chest hurts. You have trouble breathing in enough air. You have an uncomfortable feeling in your back, arms, or stomach. You have trouble talking. One side of your body feels weak. A part of your face is hanging down. These symptoms Morozov be an emergency. Get help right away. Call your local emergency services (911 in the [place] Do not wait to see if the symptoms will go away. Do not drive yourself to the hospital. Summary This condition affects breathing during sleep. The most common cause is a collapsed or blocked airway. The goal of treatment is to help you breathe normally while you sleep. This information is not intended to replace advice given to you by your health care provider. Make sure you discuss any questions you have with your health care provider. Document Revised: 08/15/2020 Document Reviewed: 12/16/2019 Elsevier Patient Education  2022 [employer].

## 2021-04-02 ENCOUNTER — Encounter: Payer: Self-pay | Admitting: Internal Medicine

## 2021-04-10 ENCOUNTER — Other Ambulatory Visit: Payer: Medicare HMO

## 2021-04-17 ENCOUNTER — Other Ambulatory Visit: Payer: Self-pay

## 2021-04-17 ENCOUNTER — Ambulatory Visit
Admission: RE | Admit: 2021-04-17 | Discharge: 2021-04-17 | Disposition: A | Discharge status: Home / Self Care | Payer: Medicare HMO | Source: Ambulatory Visit | Attending: Internal Medicine | Admitting: Internal Medicine

## 2021-04-17 DIAGNOSIS — R0602 Shortness of breath: Secondary | ICD-10-CM | POA: Diagnosis not present

## 2021-04-19 ENCOUNTER — Telehealth: Payer: Self-pay

## 2021-04-19 NOTE — Telephone Encounter (Signed)
Left vm to confirm 04/24/21 appointment-Toni ?

## 2021-04-24 ENCOUNTER — Ambulatory Visit: Payer: Medicare HMO | Admitting: Internal Medicine

## 2021-04-24 DIAGNOSIS — Z0289 Encounter for other administrative examinations: Secondary | ICD-10-CM

## 2021-05-01 ENCOUNTER — Ambulatory Visit: Payer: Medicare HMO

## 2021-05-01 DIAGNOSIS — R0602 Shortness of breath: Secondary | ICD-10-CM

## 2021-05-03 ENCOUNTER — Telehealth: Payer: Self-pay

## 2021-05-03 NOTE — Telephone Encounter (Signed)
Patient scheduled for PSG on 05/04/21 @ [facility]tat ?

## 2021-05-05 ENCOUNTER — Encounter (INDEPENDENT_AMBULATORY_CARE_PROVIDER_SITE_OTHER): Payer: Medicare HMO | Admitting: Internal Medicine

## 2021-05-05 DIAGNOSIS — G4719 Other hypersomnia: Secondary | ICD-10-CM

## 2021-05-06 ENCOUNTER — Ambulatory Visit: Payer: Medicare HMO | Admitting: Physician Assistant

## 2021-05-07 NOTE — Procedures (Signed)
? ?[facility]  ?Polysomnogram Report ?Part I ? ? ?   ?                                                           Phone: [phone] ?Fax: [phone] ? ?Patient Name: [name] [name] Acquisition Number: [id number]  ?Date of Birth: 26-Aug-1945 Acquisition Date: 05/05/2021  ?Referring Physician: Allyne Gee, MD    ? ?History: The patient is a 76 year old male who was referred for evaluation of possible sleep apnea with snoring and sleepiness. Medical History: arthritis, CAD, depression, diabetes, hypertension, RLS, high cholesterol, neuropathy, stroke and  Gout. ? ?Medications: Tylonol, albuterol 108, Eliquis, Cymbalta, Fenofibrate, Lasix, Robitussin, hydrocodone, Norco/Vicodin, Avapro, Metformin, Toprol-XL, Zocor and Drisdol. ? ?Procedure: This routine overnight polysomnogram was performed on the [facility] 5 using the standard diagnostic protocol. This included 6 channels of EEG, 2 channels of EOG, chin EMG, bilateral anterior tibialis EMG, nasal/oral thermistor, PTAF (nasal pressure transducer), chest and abdominal wall movements, EKG, and pulse oximetry. ? ?Description: The total recording time was 371.0 minutes. The total sleep time was 343.0 minutes. There were a total of 24.3 minutes of wakefulness after sleep onset for a goodsleep efficiency of 92.5%. The latency to sleep onset was shortat 3.7 minutes. The R sleep onset latency was prolonged at 320.5 minutes. Sleep parameters, as a percentage of the total sleep time, demonstrated 16.2% of sleep was in N1 sleep, 78.1% N2, 4.1% N3 and 1.6% R sleep. There were a total of 460 arousals for an arousal index of 80.5 arousals per hour of sleep that was elevated. ? ?Respiratory monitoring demonstrated frequent moderate degree of snoring. All sleep was in the supine position. There were 387 apneas and hypopneas for an Apnea Hypopnea Index of 67.7 apneas and hypopneas per hour of sleep. The REM related apnea hypopnea index was 76.4/hr of REM sleep compared to a NREM AHI  of 66.7/hr.  The average duration of the respiratory events was 28.8 seconds with a maximum duration of 81.5 seconds. The respiratory events were associated with peripheral oxygen desaturations on the average to 87%. The lowest oxygen desaturation associated with a respiratory event was 76%. Additionally, the baseline oxygen saturation during wakefulness was 92%, during NREM sleep averaged 92%, and during REM sleep averaged  89%. The total duration of oxygen < 90% was 62.9 minutes and <80% was 0.3 minutes. Supplemental oxygen was not utilized. ? ?Cardiac monitoring- possible atrial fibrillation/flutter was observed.  ? ?Periodic limb movement monitoring- did not demonstrate periodic limb movements.  ? ? ? ? ?Impression: ?This routine overnight polysomnogram demonstrated severe  obstructive sleep apnea with an overall Apnea Hypopnea Index of 67.7 apneas and hypopneas per hour of sleep with the lowest desaturation to 76%.  Baseline oxygen saturation was borderline at 92% without supplemental oxygen. ? ?There was an elevated arousal index withincreased awakeningsand reduced percentages of REM and slow wave sleep.These findings would appear to be due to the obstructive sleep apnea. ? ?Recommendations:    ?A CPAP titration would be recommended due to the severity of the sleep apnea. ?Additionally, would recommend weight loss in a patient with a BMI of 31.2.  ? ? ? ?Allyne Gee, MD, FCCP ?Diplomate ABMS-Pulmonary, Critical Care and Sleep Medicine  ?Electronically reviewed and digitally signed ? ?[facility] ?Polysomnogram Report ?Part II ? ?Phone: [phone] ?Fax: [phone] ? ?Patient last name [name] Neck Size 18.0 in. Acquisition [id number]  ?Patient first name [name] Weight 224.0 lbs. Started 05/05/2021 at 12:14:51 AM  ?Birth date April 26, 1945 Height 71.0 in. Stopped 05/05/2021 at 7:06:57 AM  ?Age 48 BMI 31.2 lb/in2 Duration 371.0  ?Study Type Adult      ?Report generated by Paulo Fruit., RPSGT  Reviewed by: Richelle Ito.  Saunders Glance, PhD, ABSM, FAASM ?Sleep Data: ?Lights Out: 12:23:39 AM Sleep Onset: 12:27:21 AM  ?Lights On: 6:34:39 AM Sleep Efficiency: 92.5 %  ?Total Recording Time: 371.0 min Sleep Latency (from Lights Off) 3.7 min  ?Total Sleep Time (TST): 343.0 min R Latency (from Sleep Onset): 320.5 min  ?Sleep Period Time: 367.0 min Total number of awakenings: 33  ?Wake during sleep: 24.0 min Wake After Sleep Onset (WASO): 24.3 min  ? ?Sleep Data:         Arousal Summary: ?Stage  ?Latency from lights out (min) Latency from sleep onset (min) Duration (min) % Total Sleep Time  ?Normal values  ?N 1 3.7 0.0 55.5 16.2 (5%)  ?N 2 4.2 0.5 268.0 78.1 (50%)  ?N 3 55.2 51.5 14.0 4.1 (20%)  ?R 324.2 320.5 5.5 1.6 (25%)  ? ? Number Index  ?Spontaneous 114 19.9  ?Apneas & Hypopneas 345 60.3  ?RERAs 0 0.0  ?     (Apneas & Hypopneas & RERAs)  (345) (60.3)  ?Limb Movement 1 0.2  ?Snore 0 0.0  ?TOTAL 460 80.5  ? ? ? ?Respiratory Data: ? CA OA MA Apnea Hypopnea* A+ H RERA Total  ?Number 0 0 12 12 375 387 0 387  ?Mean Dur (sec) 0.0 0.0 31.6 31.6 28.7 28.8 0.0 28.8  ?Max Dur (sec) 0.0 0.0 45.5 45.5 81.5 81.5 0.0 81.5  ?Total Dur (min) 0.0 0.0 6.3 6.3 179.2 185.5 0.0 185.5  ?% of TST 0.0 0.0 1.8 1.8 52.2 54.1 0.0 54.1  ?Index (#/h TST) 0.0 0.0 2.1 2.1 65.6 67.7 0.0 67.7  ?*Hypopneas scored based on 4% or greater desaturation. ? ?Sleep Stage:       ? REM NREM TST  ?AHI 76.4 66.7 67.7  ?RDI 76.4 66.7 67.7  ? ? ? ? ? ? ? ? ? ?Body Position Data: ? Sleep ?(min) TST ?(%) REM ?(min) NREM ?(min) CA ?(#) OA ?(#) MA ?(#) HYP ?(#) AHI ?(#/h) RERA ?(#) RDI ?(#/h) Desat ?(#)  ?Supine 343.0 100.00 5.5 337.5 0 0 12 375 67.7 0 67.7 405  ?Non-Supine 0.00 0.00 0.00 0.00 0.00 0.00 0.00 0.00 0.00 0 0.00 0.00  ?Left: 0.0 0.00 0.0 0.0 0 0 0 0 0.0 0 0.00 0  ?UP: 0.0 0.00 0.0 0.0 0 0 0 0 0.0 0 0.00 0  ?  ? ?Snoring: ?Total number of snoring episodes  0  ?Total time with snoring    min (   % of sleep)  ? ?Oximetry Distribution: ?            WK REM NREM TOTAL  ?Average (%)   92  89 92 92  ?< 90% 5.4 3.2 54.3 62.9  ?< 80% 0.2 0.0 0.1 0.3  ?< 70% 0.0 0.0 0.0 0.0  ?# of Desaturations* 22 9 [phone]  ?Desat Index (#/hour) 38.6 76.4 67.7 71.0  ?Desat Max (%) '13 13 16 16  '$ ?Desat Max Dur (sec) 32.0 56.0 74.0 74.0  ?Approx Min O2 during sleep 78  ?Approx min O2 during a  respiratory event 76  ?Was Oxygen added (Y/N) and final rate No:   0 LPM  ?*Desaturations based on 3% or greater drop from baseline. ? ? Cheyne Stokes Breathing: None Present  ? ?Heart Rate Summary:  ?Average Heart Rate During Sleep 39.4 bpm      ?Highest Heart Rate During Sleep (95th %) 73.0 bpm      ?Highest Heart Rate During Sleep 225 bpm (artifact)  ?Highest Heart Rate During Recording (TIB) 225 bpm (artifact)  ? ?Heart Rate Observations: ?Event Type # Events   ?Bradycardia 0 Lowest HR Scored: N/A  ?Sinus Tachycardia During Sleep 0 Highest HR Scored: N/A  ?Narrow Complex Tachycardia 0 Highest HR Scored: N/A  ?Wide Complex Tachycardia 0 Highest HR Scored: N/A  ?Asystole 0 Longest Pause: N/A  ?Atrial Fibrillation 0 Duration Longest Event: N/A  ?Other Arrythmias  No Type:   ? ?Periodic Limb Movement Data: (Primary legs unless otherwise noted) ?Total # Limb Movement 1 Limb Movement Index 0.2  ?Total # PLMS    PLMS Index     ?Total # PLMS Arousals    PLMS Arousal Index     ?Percentage Sleep Time with PLMS   min (   % sleep)  ?Mean Duration limb movements (secs)     ? ? ? ? ? ? ?

## 2021-05-08 ENCOUNTER — Encounter: Payer: Self-pay | Admitting: Internal Medicine

## 2021-05-08 ENCOUNTER — Ambulatory Visit: Payer: Medicare HMO | Admitting: Internal Medicine

## 2021-05-08 DIAGNOSIS — R0602 Shortness of breath: Secondary | ICD-10-CM

## 2021-05-15 ENCOUNTER — Telehealth: Payer: Self-pay

## 2021-05-15 NOTE — Telephone Encounter (Signed)
Patient scheduled for cpap titration on 05/22/21 @ [facility]tat ?

## 2021-05-22 NOTE — Procedures (Signed)
[facility] ?[place] ?[place], [address] ? ? ? ?Complete Pulmonary Function Testing Interpretation: ? ?FINDINGS: ? ?Forced vital capacity is severely decreased.  FEV1 is 1.03 L which is 32% of predicted and is severely decreased.  FEV1 FVC ratio was mildly decreased.  Postbronchodilator no significant improvement in the FEV1.  Total lung capacity is severely decreased.  Residual volume is decreased.  FRC is decreased.  Patient was not able to perform DLCO maneuvers ? ?IMPRESSION: ? 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 ? ?Allyne Gee, MD FCCP ?Pulmonary Critical Care Medicine ?Sleep Medicine ? ?

## 2021-05-24 ENCOUNTER — Ambulatory Visit: Payer: Medicare HMO | Admitting: Physician Assistant

## 2021-05-24 ENCOUNTER — Encounter: Payer: Self-pay | Admitting: Internal Medicine

## 2021-05-24 LAB — PULMONARY FUNCTION TEST

## 2021-06-05 ENCOUNTER — Encounter (INDEPENDENT_AMBULATORY_CARE_PROVIDER_SITE_OTHER): Payer: Medicare HMO | Admitting: Internal Medicine

## 2021-06-05 DIAGNOSIS — G4733 Obstructive sleep apnea (adult) (pediatric): Secondary | ICD-10-CM

## 2021-06-13 NOTE — Procedures (Signed)
[facility] Report Part I  Phone: [phone] Fax: [phone]  Patient Name: [name] Jr., [name] Acquisition Number: [id number]  Date of Birth: Surber 14, 1947 Acquisition Date: 06/05/2021  Referring Physician: Allyne Gee MD     History: The patient is a 76 year old male with obstructive sleep apnea for CPAP titration. Medical History: arthritis, CAD, depression, diabetes, hypertension, RLS, high cholesterol, neuropathy, stroke, heart bypass, Gout.  Medications: Tylenol, Albuterol 108, Eliquis, Cymbalta, fenofibrate, Lasix, Robitussin, hydrocodone, Norco/Vicodin, Avapro, metformin, Toprol-XL, Zocor and Drisdol.  Procedure: This routine overnight polysomnogram was performed on the [facility] 5 using the standard CPAP/BIPAP protocol. This included 6 channels of EEG, 2 channels of EOG, chin EMG, bilateral anterior tibialis EMG, nasal/oral thermistor, PTAF (nasal pressure transducer), chest and abdominal wall movements, EKG, and pulse oximetry.  Description: The total recording time was 363.0 minutes. The total sleep time was 264.5 minutes. There were a total of 67.0 minutes of wakefulness after sleep onset for a reducedsleep efficiency of 72.9%. The latency to sleep onset was slightly prolongedat 31.5 minutes. The R sleep onset latency was prolonged at 161.0 minutes. Sleep parameters, as a percentage of the total sleep time, demonstrated 0.6% of sleep was in N1 sleep, 87.3% N2, 0.0% N3 and 12.1% R sleep. There were a total of 29 arousals for an arousal index of 6.6 arousals per hour of sleep that was normal. All sleep was in the supine position.  Overall, there were a total of 96 respiratory events for a respiratory disturbance index, which includes apneas, hypopneas and RERAs (increased respiratory effort) of 21.8 respiratory events per hour of sleep during the pressure titration. CPAP was initiated at 4 cm H2O at lights out, 10:59 p.m. It was titrated in 1-2 cm increments  mostly for central hypopneas and rare obstructive apneas, to 19 cm H2O. The mode was then changed to bilevel pressure  22/18 cm H2O. The apnea appeared  controlled at CPAP pressures of 8-18 cm H2O.  Central apneas increased at higher pressures.  Additionally, the baseline oxygen saturation during wakefulness was 95%, during NREM sleep averaged 94%, and during REM sleep averaged 93%. The total duration of oxygen < 90% was 3.4 minutes.  Cardiac monitoring- atrial flutter was observed throughout the study.   Periodic limb movement monitoring- did not demonstrate periodic limb movements. Leg tremors and quasi-periodic limb movements were observed during periods of wakefulness.   Impression: This patient's obstructive sleep apnea demonstrated significant improvement with the utilization of nasal CPAP at pressures of 8-18 cmH2O. Central apneas emerged at  higher pressures. All sleep was in the supine position. Baseline oxygen saturation averaged 93-95% on PAP.   Leg tremors and quasi-periodic limb movements were observed during periods of wakefulness. Sometimes these limb movements appear transiently with the initiation of PAP.  Limited EKG monitoring again demonstrated atrial flutter throughout the study. Occasional PVCs were observed.  Recommendations: Would recommend utilization of auto-adjusting CPAP at 8-18 cm H2O.      Avoid the supine sleep position or sleep with the upper body elevated at 45 to help minimize pressure requirements. An AirFit F20 mask, size large, was used. Chin strap used during study- No . Humidifier used during study- Yes.     Allyne Gee, MD, [facility] Diplomate ABMS-Pulmonary, Critical Care and Sleep Medicine  Electronically reviewed and digitally signed   [name] [name] CPAP/BIPAP Polysomnogram Report Part II Phone: [phone] Fax: [phone]  Patient last name [name] Jr.  Neck Size 18.0 in. Acquisition [id number]  Patient first name [name] Weight  224.0 lbs. Started 06/05/2021 at 10:49:30 PM  Birth date 1945/06/18 Height 71.0 in. Stopped 06/06/2021 at 5:02:48 AM  Age 68      Type Adult BMI 31.2 lb/in2 Duration 363.0  Margaretmary Eddy Sleep Tech, Raelyn Ensign. RPSGT Reviewed by: Richelle Ito. Henke, PhD, ABSM, FAASM Sleep Data: Lights Out: 10:59:30 PM Sleep Onset: 11:31:00 PM  Lights On: 5:02:30 AM Sleep Efficiency: 72.9 %  Total Recording Time: 363.0 min Sleep Latency (from Lights Off) 31.5 min  Total Sleep Time (TST): 264.5 min R Latency (from Sleep Onset): 161.0 min  Sleep Period Time: 291.5 min Total number of awakenings: 7  Wake during sleep: 27.0 min Wake After Sleep Onset (WASO): 67.0 min   Sleep Data:         Arousal Summary: Stage  Latency from lights out (min) Latency from sleep onset (min) Duration (min) % Total Sleep Time  Normal values  N 1 261.5 230.0 1.5 0.6 (5%)  N 2 31.5 0.0 231.0 87.3 (50%)  N 3       0.0 0.0 (20%)  R 192.5 161.0 32.0 12.1 (25%)    Number Index  Spontaneous 29 6.6  Apneas & Hypopneas 1 0.2  RERAs 0 0.0       (Apneas & Hypopneas & RERAs)  (1) (0.2)  Limb Movement 0 0.0  Snore 0 0.0  TOTAL 30 6.8     Respiratory Data:  CA OA MA Apnea Hypopnea* A+ H RERA Total  Number 20 2 0 22 18 40 0 40  Mean Dur (sec) 13.9 15.8 0.0 14.0 15.9 14.9 0.0 14.9  Max Dur (sec) 24.0 17.0 0.0 24.0 23.5 24.0 0.0 24.0  Total Dur (min) 4.6 0.5 0.0 5.1 4.8 9.9 0.0 9.9  % of TST 1.7 0.2 0.0 1.9 1.8 3.7 0.0 3.7  Index (#/h TST) 4.5 0.5 0.0 5.0 4.1 9.1 0.0 9.1  *Hypopneas scored based on 4% or greater desaturation.  Sleep Stage:         REM NREM TST  AHI 1.9 10.1 9.1  RDI 1.9 10.1 9.1    Sleep (min) TST (%) REM (min) NREM (min) CA (#) OA (#) MA (#) HYP (#) AHI (#/h) RERA (#) RDI (#/h) Desat (#)  Supine 264.5 100.00 32.0 232.'5 20 2 '$ 0 18 9.1 0 9.1 115  Non-Supine 0.00 0.00 0.00 0.00 0.00 0.00 0.00 0.00 0.00 0 0.00 0.00     Snoring: Total number of snoring episodes  0  Total time with snoring    min (   % of  sleep)   Oximetry Distribution:             WK REM NREM TOTAL  Average (%)   95 93 94 94  < 90% 1.7 0.4 1.3 3.4  < 80% 0.4 0.0 0.0 0.4  < 70% 0.0 0.0 0.0 0.0  # of Desaturations* 2 14 99 115  Desat Index (#/hour) 1.2 26.3 25.5 26.1  Desat Max (%) '5 7 9 9  '$ Desat Max Dur (sec) 24.0 28.0 51.0 51.0  Approx Min O2 during sleep 86  Approx min O2 during a respiratory event 86  Was Oxygen added (Y/N) and final rate No:   0 LPM  *Desaturations based on 4% or greater drop from baseline.   Cheyne Stokes Breathing: None Present    Heart Rate Summary:  Average Heart Rate During Sleep 76.1 bpm  Highest Heart Rate During Sleep (95th %) 86.0 bpm      Highest Heart Rate During Sleep 141 bpm      Highest Heart Rate During Recording (TIB) 240 bpm (artifact)   Heart Rate Observations: Event Type # Events   Bradycardia 0 Lowest HR Scored: N/A  Sinus Tachycardia During Sleep 0 Highest HR Scored: N/A  Narrow Complex Tachycardia 0 Highest HR Scored: N/A  Wide Complex Tachycardia 0 Highest HR Scored: N/A  Asystole 0 Longest Pause: N/A  Atrial Fibrillation 0 Duration Longest Event: N/A  Other Arrythmias  No Type:   Periodic Limb Movement Data: (Primary legs unless otherwise noted) Total # Limb Movement 0 Limb Movement Index 0.0  Total # PLMS    PLMS Index     Total # PLMS Arousals    PLMS Arousal Index     Percentage Sleep Time with PLMS   min (   % sleep)  Mean Duration limb movements (secs)       IPAP Level (cmH2O) EPAP Level (cmH2O) Total Duration (min) Sleep Duration (min) Sleep (%) REM (%) CA  #) OA # MA # HYP #) AHI (#/hr) RERAs # RERAs (#/hr) RDI (#/hr)  4 4 7.2 7.2 100.0 0.0 0 0 0 8 66.7 0 0.0 66.'7  6 6 '$ 11.4 11.4 100.0 0.0 0 0 0 2 10.5 0 0.0 10.'5  8 8 '$ 23.8 23.8 100.0 0.0 0 0 0 3 7.6 0 0.0 7.'6  10 10 '$ 19.6 19.6 100.0 0.0 0 0 0 1 3.1 0 0.0 3.'1  12 12 '$ 20.6 11.6 56.3 0.0 1 0 0 1 10.3 0 0.0 10.'3  13 13 '$ 32.2 25.7 79.8 0.0 2 0 0 1 7.0 0 0.0 7.0  14 14 24.9 24.9 100.0 0.0 1 0 0 0 2.4 0  0.0 2.'4  15 15 '$ 26.9 22.9 85.1 29.4 2 0 0 0 5.2 0 0.0 5.'2  16 16 '$ 5.7 4.7 82.5 82.5 0 0 0 0 0.0 0 0.0 0.0  17 17 6.8 6.8 100.0 100.0 0 0 0 1 8.8 0 0.0 8.'8  18 18 '$ 21.8 18.8 86.2 53.'2 3 1 '$ 0 1 16.0 0 0.0 16.0  19 19 16.7 16.7 100.0 0.0 7 0 0 0 25.1 0 0.0 25.'1  22 18 '$ 70.3 66.8 95.0 0.0 4 1 0 0 4.5 0 0.0 4.5

## 2021-06-20 ENCOUNTER — Encounter: Payer: Self-pay | Admitting: Physician Assistant

## 2021-06-20 ENCOUNTER — Ambulatory Visit: Payer: Medicare HMO | Admitting: Physician Assistant

## 2021-06-20 VITALS — BP 132/84 | HR 95 | Temp 97.8°F | Resp 16 | Ht 71.0 in | Wt 226.0 lb

## 2021-06-20 DIAGNOSIS — G4733 Obstructive sleep apnea (adult) (pediatric): Secondary | ICD-10-CM | POA: Diagnosis not present

## 2021-06-20 DIAGNOSIS — J449 Chronic obstructive pulmonary disease, unspecified: Secondary | ICD-10-CM

## 2021-06-20 DIAGNOSIS — I2510 Atherosclerotic heart disease of native coronary artery without angina pectoris: Secondary | ICD-10-CM

## 2021-06-20 DIAGNOSIS — J4489 Other specified chronic obstructive pulmonary disease: Secondary | ICD-10-CM

## 2021-06-20 DIAGNOSIS — I2584 Coronary atherosclerosis due to calcified coronary lesion: Secondary | ICD-10-CM

## 2021-06-20 DIAGNOSIS — Z9981 Dependence on supplemental oxygen: Secondary | ICD-10-CM | POA: Diagnosis not present

## 2021-06-20 DIAGNOSIS — I5189 Other ill-defined heart diseases: Secondary | ICD-10-CM

## 2021-06-20 MED ORDER — TRELEGY ELLIPTA 100-62.5-25 MCG/ACT IN AEPB: 1.0000 | INHALATION_SPRAY | Freq: Every day | RESPIRATORY_TRACT | 11 refills | Status: DC

## 2021-06-20 NOTE — Progress Notes (Signed)
[facility] [address] [place], [place] [address]  Pulmonary Sleep Medicine   Office Visit Note  Patient Name: [name] Helfman Cronic Jr. DOB: 1945/02/05 MRN [id number]  Date of Service: 06/25/2021  Complaints/HPI: patient is here for pulmonary follow up to review recent test results. He had a PFT, CT chest, sleep study, and echo updated and reviewed with findings stated below. He states he has a Cardiology visit next Thursday and will further discuss his echo and CT chest findings of coronary artery calcifications at that time. He is using [employer] for oxygen and is on 2L continuous. He is using an albuterol inhaler, but not a daily inhaler and will be started on trelegy today given PFT results--sample given. He does also report that he was recently started on bupropion '75mg'$ , and double cymbalta, and has his PCP visit next month. Doesn't get to sleep until 1am and poor sleep, but does not think he could tolerate PAP. Impressed upon him the severity of his apnea and the health consequences associated with going untreated and that he really needs to start on therapy, however patient continues to decline at this time and will think about this further. Discussed that his cardiologist will probably also discuss importance of starting therapy due to impacts it can have on the heart.  PSG: severe OSA with AHI 67.7 per hour with baseline Oxygen at 92% and lowest desat to 76% without supplemental. Titration: recommends APAP 8-18cm H2O, avoid supine sleep or sleep with upper body elevated to 45 degrees tp minimize pressure requirements. Baseline oxygen on CPAP avg 93-95%.  PFT: FEV1 1.03L, 32% predicted and ratio mildly decreased. Consistent with severe obstructive and restrictive lung disease.  CT chest: IMPRESSION:  There is no focal pulmonary consolidation. There is no pleural effusion or pneumothorax. Small linear densities in the lingula and left lower lobe [place] suggest scarring or subsegmental  atelectasis.  Extensive coronary artery calcifications are seen.   Echo: LV normal in size.  Mildly increased wall thickness.  Diastolic filling with abnormal pattern.  Normal systolic global function on the left ventricle.  Calculated EF 59.18%.  Left atrial cavity is borderline dilated.  Right ventricle is NWV.  Right atrial cavity NWV slightly dilated.  Mitral valve NWV, trace mitral regurgitation.  Tricuspid valve NWV with trace tricuspid regurgitation.  Pulmonic valve NWV.  Overall recommendation is LVH risk factor modification, consider diastolic dysfunction.  ROS  General: (-) fever, (-) chills, (-) night sweats, (-) weakness Skin: (-) rashes, (-) itching,. Eyes: (-) visual changes, (-) redness, (-) itching. Nose and Sinuses: (-) nasal stuffiness or itchiness, (-) postnasal drip, (-) nosebleeds, (-) sinus trouble. Mouth and Throat: (-) sore throat, (-) hoarseness. Neck: (-) swollen glands, (-) enlarged thyroid, (-) neck pain. Respiratory: + cough, (-) bloody sputum, + shortness of breath, + wheezing. Cardiovascular: - ankle swelling, (-) chest pain. Lymphatic: (-) lymph node enlargement. Neurologic: (-) numbness, (-) tingling. Psychiatric: (-) anxiety, (-) depression   Current Medication: Outpatient Encounter Medications as of 06/20/2021  Medication Sig   acetaminophen (TYLENOL) 325 MG tablet Take 650 mg by mouth every 6 (six) hours as needed for mild pain or moderate pain.   albuterol (VENTOLIN HFA) 108 (90 Base) MCG/ACT inhaler Inhale into the lungs every 6 (six) hours as needed for wheezing or shortness of breath.   apixaban (ELIQUIS) 5 MG TABS tablet Take 5 mg by mouth 2 (two) times daily.   DULoxetine (CYMBALTA) 30 MG capsule Take 30 mg by mouth 2 (two)  times daily.   fenofibrate 160 MG tablet Take 160 mg by mouth daily.   Fluticasone-Umeclidin-Vilant (TRELEGY ELLIPTA) 100-62.5-25 MCG/ACT AEPB Inhale 1 puff into the lungs daily.   furosemide (LASIX) 40 MG tablet Take 1 tablet  (40 mg total) by mouth daily as needed for fluid (if weight up by 3 lbs).   guaiFENesin (ROBITUSSIN) 100 MG/5ML liquid Take 5 mLs by mouth every 4 (four) hours as needed for cough or to loosen phlegm.   HYDROcodone-acetaminophen (NORCO/VICODIN) 5-325 MG tablet Take 1 tablet by mouth every 6 (six) hours as needed for moderate pain.   irbesartan (AVAPRO) 150 MG tablet Take 150 mg by mouth daily.   metFORMIN (GLUCOPHAGE) 500 MG tablet Take 500 mg by mouth daily.   metoprolol succinate (TOPROL-XL) 100 MG 24 hr tablet Take 1 tablet (100 mg total) by mouth daily. Take with or immediately following a meal.   simvastatin (ZOCOR) 40 MG tablet Take 40 mg by mouth at bedtime.   Vitamin D, Ergocalciferol, (DRISDOL) 1.25 MG (50000 UNIT) CAPS capsule Take 1 capsule (50,000 Units total) by mouth every 7 (seven) days.   No facility-administered encounter medications on file as of 06/20/2021.    Surgical History: Past Surgical History:  Procedure Laterality Date   BACK SURGERY     CARDIAC CATHETERIZATION     COLONOSCOPY WITH PROPOFOL N/A 11/10/2018   Procedure: COLONOSCOPY WITH PROPOFOL;  Surgeon: Robert Bellow, MD;  Location: [facility] ENDOSCOPY;  Service: Endoscopy;  Laterality: N/A;   CORONARY ARTERY BYPASS GRAFT     (2010 @ [facility]     ?? BLEPHOPLASTY   HAND SURGERY     CRUSHING INJURY---1966  HAS ABOUT 6 SURGERIES THEN   HAND SURGERY Right    HERNIA REPAIR     BILATERAL   LUMBAR FUSION  11/25/2011   POSTERIOR    Medical History: Past Medical History:  Diagnosis Date   Arthritis    Coronary artery disease    Depression    STARTED AFTER  OPEN HEART SURG.   Diabetes mellitus without complication ([place])    Borderline   Foot pain, bilateral    Gout    Hypertension    Insomnia 10/27/2012   Lumbosacral spinal stenosis    Myocardial infarction ([place])    Neuropathy    FIBRO        SEES  DR. Raliegh Ip  [place]   Restless leg syndrome    Stroke [facility])    2008  RIGHT SIDE...HAD FOR A COUPLE OF  DAYS AND THEN WENT AWAY   Stroke ([place])    WENT TO [place]  2008    Family History: Family History  Problem Relation Age of Onset   Diabetes Mother     Social History: Social History   Socioeconomic History   Marital status: Married    Spouse name: Not on file   Number of children: Not on file   Years of education: Not on file   Highest education level: Not on file  Occupational History   Not on file  Tobacco Use   Smoking status: Former    Packs/day: 1.00    Years: 30.00    Pack years: 30.00    Types: Cigarettes    Start date: 01/21/1968    Quit date: 01/20/2006    Years since quitting: 15.4   Smokeless tobacco: Former    Quit date: 01/25/2006  Vaping Use   Vaping Use: Never used  Substance and Sexual Activity  Alcohol use: Yes    Alcohol/week: 12.0 standard drinks    Types: 4 Glasses of wine, 8 Shots of liquor per week    Comment: occasional   Drug use: No   Sexual activity: Not on file  Other Topics Concern   Not on file  Social History Narrative   Not on file   Social Determinants of Health   Financial Resource Strain: Not on file  Food Insecurity: Not on file  Transportation Needs: Not on file  Physical Activity: Not on file  Stress: Not on file  Social Connections: Not on file  Intimate Partner Violence: Not on file    Vital Signs: Blood pressure 132/84, pulse 95, temperature 97.8 F (36.6 C), resp. rate 16, height '5\' 11"'$  (1.803 m), weight 226 lb (102.5 kg), SpO2 99 %.  Examination: General Appearance: The patient is well-developed, well-nourished, and in no distress. Skin: Gross inspection of skin unremarkable. Head: normocephalic, no gross deformities. Eyes: no gross deformities noted. ENT: ears appear grossly normal no exudates. Neck: Supple. No thyromegaly. No LAD. Respiratory: Some wheezing noted throughout. On oxygen in office. Cardiovascular: Normal S1 and S2 without murmur or rub. Extremities: No cyanosis. pulses are  equal. Neurologic: Alert and oriented. No involuntary movements.  LABS: Recent Results (from the past 2160 hour(s))  Pulmonary Function Test     Status: None   Collection Time: 05/24/21  1:27 PM  Result Value Ref Range   FEV1     FVC     FEV1/FVC     TLC     DLCO      Radiology: CT Chest Wo Contrast  Result Date: 04/18/2021 CLINICAL DATA:  Dyspnea, cough EXAM: CT CHEST WITHOUT CONTRAST TECHNIQUE: Multidetector CT imaging of the chest was performed following the standard protocol without IV contrast. RADIATION DOSE REDUCTION: This exam was performed according to the departmental dose-optimization program which includes automated exposure control, adjustment of the mA and/or kV according to patient size and/or use of iterative reconstruction technique. COMPARISON:  11/26/2020 FINDINGS: Cardiovascular: Coronary artery calcifications are seen. There are scattered calcifications in the thoracic aorta and its major branches. Mediastinum/Nodes: No new significant lymphadenopathy seen. Lungs/Pleura: There is interval clearing of patchy infiltrates in the left lower lobe. There is interval clearing of small bilateral pleural effusions. There is no focal pulmonary consolidation. There are small linear densities in the lingula and left lower lobe suggesting scarring or subsegmental atelectasis. There is no pneumothorax. Left hemidiaphragm is slightly elevated. Upper Abdomen: There are few scattered calcifications in the liver, possibly granulomas. Musculoskeletal: Degenerative changes are noted in the thoracic spine. IMPRESSION: There is no focal pulmonary consolidation. There is no pleural effusion or pneumothorax. Small linear densities in the lingula and left lower lobe [place] suggest scarring or subsegmental atelectasis. Extensive coronary artery calcifications are seen. Electronically Signed   By: Elmer Picker M.D.   On: 04/18/2021 08:42    No results found.  No results found.    Assessment  and Plan: Patient Active Problem List   Diagnosis Date Noted   Depression 11/21/2020   Goals of care, counseling/discussion 11/20/2020   Acute respiratory failure with hypoxia and hypercapnia ([place]) 16/10/9602   Toxic metabolic encephalopathy 54/09/8117   Coronary artery disease    Hypertension    Acute bronchitis due to infection    Community acquired pneumonia    Diabetes ([place]) 01/08/2016   Hyperlipidemia 01/08/2016   Pain in limb 01/08/2016   Insomnia 10/27/2012   Peripheral neuropathy 03/09/2012  Lumbar stenosis 12/04/2011    1. Obstructive chronic bronchitis without exacerbation ([place]) Will start on trelegy daily and continue albuterol as needed. - Fluticasone-Umeclidin-Vilant (TRELEGY ELLIPTA) 100-62.5-25 MCG/ACT AEPB; Inhale 1 puff into the lungs daily.  Dispense: 1 each; Refill: 11  2. On supplemental oxygen therapy Continue oxygen  3. OSA (obstructive sleep apnea) Advised to start on APAP as recommended and discussed negative consequences of going untreated however patient declining today  4. Coronary artery disease due to calcified coronary lesion Will follow up with cardiology  5. Diastolic dysfunction BP controlled in office, will follow up with cardiology   General Counseling: I have discussed the findings of the evaluation and examination with [name] [name].  I have also discussed any further diagnostic evaluation thatmay be needed or ordered today. [name] [name] verbalizes understanding of the findings of todays visit. We also reviewed his medications today and discussed drug interactions and side effects including but not limited excessive drowsiness and altered mental states. We also discussed that there is always a risk not just to him but also people around him. he has been encouraged to call the office with any questions or concerns that should arise related to todays visit.  No orders of the defined types were placed in this encounter.    Time spent: 54  I have  personally obtained a history, examined the patient, evaluated laboratory and imaging results, formulated the assessment and plan and placed orders. This patient was seen by Drema Dallas, PA-C in collaboration with Dr. Devona Konig as a part of collaborative care agreement.     Allyne Gee, MD [facility] Pulmonary and Critical Care Sleep medicine

## 2021-06-25 NOTE — Patient Instructions (Signed)
Living With Sleep Apnea Sleep apnea is a condition in which breathing pauses or becomes shallow during sleep. Sleep apnea is most commonly caused by a collapsed or blocked airway. People with sleep apnea usually snore loudly. They Dykema have times when they gasp and stop breathing for 10 seconds or more during sleep. This Pickerel happen many times during the night. The breaks in breathing also interrupt the deep sleep that you need to feel rested. Even if you do not completely wake up from the gaps in breathing, your sleep Moman not be restful and you feel tired during the day. You Motz also have a headache in the morning and low energy during the day, and you Crownover feel anxious or depressed. How can sleep apnea affect me? Sleep apnea increases your chances of extreme tiredness during the day (daytime fatigue). It can also increase your risk for health conditions, such as: Heart attack. Stroke. Obesity. Type 2 diabetes. Heart failure. Irregular heartbeat. High blood pressure. If you have daytime fatigue as a result of sleep apnea, you Schwalm be more likely to: Perform poorly at school or work. Fall asleep while driving. Have difficulty with attention. Develop depression or anxiety. Have sexual dysfunction. What actions can I take to manage sleep apnea? Sleep apnea treatment  If you were given a device to open your airway while you sleep, use it only as told by your health care provider. You Heck be given: An oral appliance. This is a custom-made mouthpiece that shifts your lower jaw forward. A continuous positive airway pressure (CPAP) device. This device blows air through a mask when you breathe out (exhale). A nasal expiratory positive airway pressure (EPAP) device. This device has valves that you put into each nostril. A bi-level positive airway pressure (BIPAP) device. This device blows air through a mask when you breathe in (inhale) and breathe out (exhale). You Noe need surgery if other treatments  do not work for you. Sleep habits Go to sleep and wake up at the same time every day. This helps set your internal clock (circadian rhythm) for sleeping. If you stay up later than usual, such as on weekends, try to get up in the morning within 2 hours of your normal wake time. Try to get at least 7-9 hours of sleep each night. Stop using a computer, tablet, and mobile phone a few hours before bedtime. Do not take long naps during the day. If you nap, limit it to 30 minutes. Have a relaxing bedtime routine. Reading or listening to music Hollenbach relax you and help you sleep. Use your bedroom only for sleep. Keep your television and computer out of your bedroom. Keep your bedroom cool, dark, and quiet. Use a supportive mattress and pillows. Follow your health care provider's instructions for other changes to sleep habits. Nutrition Do not eat heavy meals in the evening. Do not have caffeine in the later part of the day. The effects of caffeine can last for more than 5 hours. Follow your health care provider's or dietitian's instructions for any diet changes. Lifestyle     Do not drink alcohol before bedtime. Alcohol can cause you to fall asleep at first, but then it can cause you to wake up in the middle of the night and have trouble getting back to sleep. Do not use any products that contain nicotine or tobacco. These products include cigarettes, chewing tobacco, and vaping devices, such as e-cigarettes. If you need help quitting, ask your health care provider. Medicines Take   over-the-counter and prescription medicines only as told by your health care provider. Do not use over-the-counter sleep medicine. You can become dependent on this medicine, and it can make sleep apnea worse. Do not use medicines, such as sedatives and narcotics, unless told by your health care provider. Activity Exercise on most days, but avoid exercising in the evening. Exercising near bedtime can interfere with  sleeping. If possible, spend time outside every day. Natural light helps regulate your circadian rhythm. General information Lose weight if you need to, and maintain a healthy weight. Keep all follow-up visits. This is important. If you are having surgery, make sure to tell your health care provider that you have sleep apnea. You Kassem need to bring your device with you. Where to find more information Learn more about sleep apnea and daytime fatigue from: American Sleep Association: sleepassociation.org National Sleep Foundation: sleepfoundation.org National Heart, Lung, and Blood Institute: nhlbi.nih.gov Summary Sleep apnea is a condition in which breathing pauses or becomes shallow during sleep. Sleep apnea can cause daytime fatigue and other serious health conditions. You Hoppes need to wear a device while sleeping to help keep your airway open. If you are having surgery, make sure to tell your health care provider that you have sleep apnea. You Stemler need to bring your device with you. Making changes to sleep habits, diet, lifestyle, and activity can help you manage sleep apnea. This information is not intended to replace advice given to you by your health care provider. Make sure you discuss any questions you have with your health care provider. Document Revised: 08/15/2020 Document Reviewed: 12/16/2019 Elsevier Patient Education  2023 [employer] Chronic Obstructive Pulmonary Disease  Chronic obstructive pulmonary disease (COPD) is a long-term (chronic) lung problem. When you have COPD, it is hard for air to get in and out of your lungs. Usually the condition gets worse over time, and your lungs will never return to normal. There are things you can do to keep yourself as healthy as possible. What are the causes? Smoking. This is the most common cause. Certain genes passed from parent to child (inherited). What increases the risk? Being exposed to secondhand smoke from cigarettes, pipes,  or cigars. Being exposed to chemicals and other irritants, such as fumes and dust in the work environment. Having chronic lung conditions or infections. What are the signs or symptoms? Shortness of breath, especially during physical activity. A long-term cough with a large amount of thick mucus. Sometimes, the cough Dobies not have any mucus (dry cough). Wheezing. Breathing quickly. Skin that looks gray or blue, especially in the fingers, toes, or lips. Feeling tired (fatigue). Weight loss. Chest tightness. Having infections often. Episodes when breathing symptoms become much worse (exacerbations). At the later stages of this disease, you Diclemente have swelling in the ankles, feet, or legs. How is this treated? Taking medicines. Quitting smoking, if you smoke. Rehabilitation. This includes steps to make your body work better. It Woodcox involve a team of specialists. Doing exercises. Making changes to your diet. Using oxygen. Lung surgery. Lung transplant. Comfort measures (palliative care). Follow these instructions at home: Medicines Take over-the-counter and prescription medicines only as told by your doctor. Talk to your doctor before taking any cough or allergy medicines. You Kohan need to avoid medicines that cause your lungs to be dry. Lifestyle If you smoke, stop smoking. Smoking makes the problem worse. Do not smoke or use any products that contain nicotine or tobacco. If you need help quitting, ask your doctor.   Avoid being around things that make your breathing worse. This Haring include smoke, chemicals, and fumes. Stay active, but remember to rest as well. Learn and use tips on how to manage stress and control your breathing. Make sure you get enough sleep. Most adults need at least 7 hours of sleep every night. Eat healthy foods. Eat smaller meals more often. Rest before meals. Controlled breathing Learn and use tips on how to control your breathing as told by your doctor.  Try: Breathing in (inhaling) through your nose for 1 second. Then, pucker your lips and breath out (exhale) through your lips for 2 seconds. Putting one hand on your belly (abdomen). Breathe in slowly through your nose for 1 second. Your hand on your belly should move out. Pucker your lips and breathe out slowly through your lips. Your hand on your belly should move in as you breathe out.  Controlled coughing Learn and use controlled coughing to clear mucus from your lungs. Follow these steps: Lean your head a little forward. Breathe in deeply. Try to hold your breath for 3 seconds. Keep your mouth slightly open while coughing 2 times. Spit any mucus out into a tissue. Rest and do the steps again 1 or 2 times as needed. General instructions Make sure you get all the shots (vaccines) that your doctor recommends. Ask your doctor about a flu shot and a pneumonia shot. Use oxygen therapy and pulmonary rehabilitation if told by your doctor. If you need home oxygen therapy, ask your doctor if you should buy a tool to measure your oxygen level (oximeter). Make a COPD action plan with your doctor. This helps you to know what to do if you feel worse than usual. Manage any other conditions you have as told by your doctor. Avoid going outside when it is very hot, cold, or humid. Avoid people who have a sickness you can catch (contagious). Keep all follow-up visits. Contact a doctor if: You cough up more mucus than usual. There is a change in the color or thickness of the mucus. It is harder to breathe than usual. Your breathing is faster than usual. You have trouble sleeping. You need to use your medicines more often than usual. You have trouble doing your normal activities such as getting dressed or walking around the house. Get help right away if: You have shortness of breath while resting. You have shortness of breath that stops you from: Being able to talk. Doing normal activities. Your  chest hurts for longer than 5 minutes. Your skin color is more blue than usual. Your pulse oximeter shows that you have low oxygen for longer than 5 minutes. You have a fever. You feel too tired to breathe normally. These symptoms Hermida represent a serious problem that is an emergency. Do not wait to see if the symptoms will go away. Get medical help right away. Call your local emergency services (911 in the [place] Do not drive yourself to the hospital. Summary Chronic obstructive pulmonary disease (COPD) is a long-term lung problem. The way your lungs work will never return to normal. Usually the condition gets worse over time. There are things you can do to keep yourself as healthy as possible. Take over-the-counter and prescription medicines only as told by your doctor. If you smoke, stop. Smoking makes the problem worse. This information is not intended to replace advice given to you by your health care provider. Make sure you discuss any questions you have with your health care provider. Document   Revised: 11/15/2019 Document Reviewed: 11/15/2019 Elsevier Patient Education  2023 [employer]  

## 2021-09-19 ENCOUNTER — Ambulatory Visit: Payer: Medicare HMO | Admitting: Physician Assistant

## 2021-09-24 ENCOUNTER — Ambulatory Visit: Payer: Medicare HMO | Admitting: Internal Medicine

## 2021-09-24 ENCOUNTER — Encounter: Payer: Self-pay | Admitting: Internal Medicine

## 2021-09-24 VITALS — BP 120/74 | HR 83 | Temp 98.2°F | Resp 16 | Ht 71.0 in | Wt 224.6 lb

## 2021-09-24 DIAGNOSIS — R0602 Shortness of breath: Secondary | ICD-10-CM | POA: Diagnosis not present

## 2021-09-24 DIAGNOSIS — J449 Chronic obstructive pulmonary disease, unspecified: Secondary | ICD-10-CM

## 2021-09-24 DIAGNOSIS — G4733 Obstructive sleep apnea (adult) (pediatric): Secondary | ICD-10-CM

## 2021-09-24 DIAGNOSIS — I5189 Other ill-defined heart diseases: Secondary | ICD-10-CM | POA: Diagnosis not present

## 2021-09-24 DIAGNOSIS — Z9981 Dependence on supplemental oxygen: Secondary | ICD-10-CM

## 2021-09-24 NOTE — Patient Instructions (Signed)
Sleep Apnea Sleep apnea affects breathing during sleep. It causes breathing to stop for 10 seconds or more, or to become shallow. People with sleep apnea usually snore loudly. It can also increase the risk of: Heart attack. Stroke. Being very overweight (obese). Diabetes. Heart failure. Irregular heartbeat. High blood pressure. The goal of treatment is to help you breathe normally again. What are the causes?  The most common cause of this condition is a collapsed or blocked airway. There are three kinds of sleep apnea: Obstructive sleep apnea. This is caused by a blocked or collapsed airway. Central sleep apnea. This happens when the brain does not send the right signals to the muscles that control breathing. Mixed sleep apnea. This is a combination of obstructive and central sleep apnea. What increases the risk? Being overweight. Smoking. Having a small airway. Being older. Being male. Drinking alcohol. Taking medicines to calm yourself (sedatives or tranquilizers). Having family members with the condition. Having a tongue or tonsils that are larger than normal. What are the signs or symptoms? Trouble staying asleep. Loud snoring. Headaches in the morning. Waking up gasping. Dry mouth or sore throat in the morning. Being sleepy or tired during the day. If you are sleepy or tired during the day, you [place] also: Not be able to focus your mind (concentrate). Forget things. Get angry a lot and have mood swings. Feel sad (depressed). Have changes in your personality. Have less interest in sex, if you are male. Be unable to have an erection, if you are male. How is this treated?  Sleeping on your side. Using a medicine to get rid of mucus in your nose (decongestant). Avoiding the use of alcohol, medicines to help you relax, or certain pain medicines (narcotics). Losing weight, if needed. Changing your diet. Quitting smoking. Using a machine to open your airway while you  sleep, such as: An oral appliance. This is a mouthpiece that shifts your lower jaw forward. A CPAP device. This device blows air through a mask when you breathe out (exhale). An EPAP device. This has valves that you put in each nostril. A BIPAP device. This device blows air through a mask when you breathe in (inhale) and breathe out. Having surgery if other treatments do not work. Follow these instructions at home: Lifestyle Make changes that your doctor recommends. Eat a healthy diet. Lose weight if needed. Avoid alcohol, medicines to help you relax, and some pain medicines. Do not smoke or use any products that contain nicotine or tobacco. If you need help quitting, ask your doctor. General instructions Take over-the-counter and prescription medicines only as told by your doctor. If you were given a machine to use while you sleep, use it only as told by your doctor. If you are having surgery, make sure to tell your doctor you have sleep apnea. You [place] need to bring your device with you. Keep all follow-up visits. Contact a doctor if: The machine that you were given to use during sleep bothers you or does not seem to be working. You do not get better. You get worse. Get help right away if: Your chest hurts. You have trouble breathing in enough air. You have an uncomfortable feeling in your back, arms, or stomach. You have trouble talking. One side of your body feels weak. A part of your face is hanging down. These symptoms [place] be an emergency. Get help right away. Call your local emergency services (911 in the [place] Do not wait to see if the   symptoms will go away. Do not drive yourself to the hospital. Summary This condition affects breathing during sleep. The most common cause is a collapsed or blocked airway. The goal of treatment is to help you breathe normally while you sleep. This information is not intended to replace advice given to you by your health care provider. Make  sure you discuss any questions you have with your health care provider. Document Revised: 08/15/2020 Document Reviewed: 12/16/2019 Elsevier Patient Education  [name]. Sleep Apnea Sleep apnea affects breathing during sleep. It causes breathing to stop for 10 seconds or more, or to become shallow. People with sleep apnea usually snore loudly. It can also increase the risk of: Heart attack. Stroke. Being very overweight (obese). Diabetes. Heart failure. Irregular heartbeat. High blood pressure. The goal of treatment is to help you breathe normally again. What are the causes?  The most common cause of this condition is a collapsed or blocked airway. There are three kinds of sleep apnea: Obstructive sleep apnea. This is caused by a blocked or collapsed airway. Central sleep apnea. This happens when the brain does not send the right signals to the muscles that control breathing. Mixed sleep apnea. This is a combination of obstructive and central sleep apnea. What increases the risk? Being overweight. Smoking. Having a small airway. Being older. Being male. Drinking alcohol. Taking medicines to calm yourself (sedatives or tranquilizers). Having family members with the condition. Having a tongue or tonsils that are larger than normal. What are the signs or symptoms? Trouble staying asleep. Loud snoring. Headaches in the morning. Waking up gasping. Dry mouth or sore throat in the morning. Being sleepy or tired during the day. If you are sleepy or tired during the day, you [place] also: Not be able to focus your mind (concentrate). Forget things. Get angry a lot and have mood swings. Feel sad (depressed). Have changes in your personality. Have less interest in sex, if you are male. Be unable to have an erection, if you are male. How is this treated?  Sleeping on your side. Using a medicine to get rid of mucus in your nose (decongestant). Avoiding the use of alcohol,  medicines to help you relax, or certain pain medicines (narcotics). Losing weight, if needed. Changing your diet. Quitting smoking. Using a machine to open your airway while you sleep, such as: An oral appliance. This is a mouthpiece that shifts your lower jaw forward. A CPAP device. This device blows air through a mask when you breathe out (exhale). An EPAP device. This has valves that you put in each nostril. A BIPAP device. This device blows air through a mask when you breathe in (inhale) and breathe out. Having surgery if other treatments do not work. Follow these instructions at home: Lifestyle Make changes that your doctor recommends. Eat a healthy diet. Lose weight if needed. Avoid alcohol, medicines to help you relax, and some pain medicines. Do not smoke or use any products that contain nicotine or tobacco. If you need help quitting, ask your doctor. General instructions Take over-the-counter and prescription medicines only as told by your doctor. If you were given a machine to use while you sleep, use it only as told by your doctor. If you are having surgery, make sure to tell your doctor you have sleep apnea. You T[place] need to bring your device with you. Keep all follow-up visits. Contact a doctor if: The machine that you were given to use during sleep bothers you or does  not seem to be working. You do not get better. You get worse. Get help right away if: Your chest hurts. You have trouble breathing in enough air. You have an uncomfortable feeling in your back, arms, or stomach. You have trouble talking. One side of your body feels weak. A part of your face is hanging down. These symptoms Alms be an emergency. Get help right away. Call your local emergency services (911 in the [place] Do not wait to see if the symptoms will go away. Do not drive yourself to the hospital. Summary This condition affects breathing during sleep. The most common cause is a collapsed or  blocked airway. The goal of treatment is to help you breathe normally while you sleep. This information is not intended to replace advice given to you by your health care provider. Make sure you discuss any questions you have with your health care provider. Document Revised: 08/15/2020 Document Reviewed: 12/16/2019 Elsevier Patient Education  [place]. Chronic Obstructive Pulmonary Disease  Chronic obstructive pulmonary disease (COPD) is a long-term (chronic) lung problem. When you have COPD, it is hard for air to get in and out of your lungs. Usually the condition gets worse over time, and your lungs will never return to normal. There are things you can do to keep yourself as healthy as possible. What are the causes? Smoking. This is the most common cause. Certain genes passed from parent to child (inherited). What increases the risk? Being exposed to secondhand smoke from cigarettes, pipes, or cigars. Being exposed to chemicals and other irritants, such as fumes and dust in the work environment. Having chronic lung conditions or infections. What are the signs or symptoms? Shortness of breath, especially during physical activity. A long-term cough with a large amount of thick mucus. Sometimes, the cough [place] not have any mucus (dry cough). Wheezing. Breathing quickly. Skin that looks gray or blue, especially in the fingers, toes, or lips. Feeling tired (fatigue). Weight loss. Chest tightness. Having infections often. Episodes when breathing symptoms become much worse (exacerbations). At the later stages of this disease, you Ramirez have swelling in the ankles, feet, or legs. How is this treated? Taking medicines. Quitting smoking, if you smoke. Rehabilitation. This includes steps to make your body work better. It [place] involve a team of specialists. Doing exercises. Making changes to your diet. Using oxygen. Lung surgery. Lung transplant. Comfort measures (palliative  care). Follow these instructions at home: Medicines Take over-the-counter and prescription medicines only as told by your doctor. Talk to your doctor before taking any cough or allergy medicines. You Riesen need to avoid medicines that cause your lungs to be dry. Lifestyle If you smoke, stop smoking. Smoking makes the problem worse. Do not smoke or use any products that contain nicotine or tobacco. If you need help quitting, ask your doctor. Avoid being around things that make your breathing worse. This [place] include smoke, chemicals, and fumes. Stay active, but remember to rest as well. Learn and use tips on how to manage stress and control your breathing. Make sure you get enough sleep. Most adults need at least 7 hours of sleep every night. Eat healthy foods. Eat smaller meals more often. Rest before meals. Controlled breathing Learn and use tips on how to control your breathing as told by your doctor. Try: Breathing in (inhaling) through your nose for 1 second. Then, pucker your lips and breath out (exhale) through your lips for 2 seconds. Putting one hand on your belly (abdomen). Breathe  in slowly through your nose for 1 second. Your hand on your belly should move out. Pucker your lips and breathe out slowly through your lips. Your hand on your belly should move in as you breathe out.  Controlled coughing Learn and use controlled coughing to clear mucus from your lungs. Follow these steps: Lean your head a little forward. Breathe in deeply. Try to hold your breath for 3 seconds. Keep your mouth slightly open while coughing 2 times. Spit any mucus out into a tissue. Rest and do the steps again 1 or 2 times as needed. General instructions Make sure you get all the shots (vaccines) that your doctor recommends. Ask your doctor about a flu shot and a pneumonia shot. Use oxygen therapy and pulmonary rehabilitation if told by your doctor. If you need home oxygen therapy, ask your doctor if you  should buy a tool to measure your oxygen level (oximeter). Make a COPD action plan with your doctor. This helps you to know what to do if you feel worse than usual. Manage any other conditions you have as told by your doctor. Avoid going outside when it is very hot, cold, or humid. Avoid people who have a sickness you can catch (contagious). Keep all follow-up visits. Contact a doctor if: You cough up more mucus than usual. There is a change in the color or thickness of the mucus. It is harder to breathe than usual. Your breathing is faster than usual. You have trouble sleeping. You need to use your medicines more often than usual. You have trouble doing your normal activities such as getting dressed or walking around the house. Get help right away if: You have shortness of breath while resting. You have shortness of breath that stops you from: Being able to talk. Doing normal activities. Your chest hurts for longer than 5 minutes. Your skin color is more blue than usual. Your pulse oximeter shows that you have low oxygen for longer than 5 minutes. You have a fever. You feel too tired to breathe normally. These symptoms [place] represent a serious problem that is an emergency. Do not wait to see if the symptoms will go away. Get medical help right away. Call your local emergency services (911 in the [place] Do not drive yourself to the hospital. Summary Chronic obstructive pulmonary disease (COPD) is a long-term lung problem. The way your lungs work will never return to normal. Usually the condition gets worse over time. There are things you can do to keep yourself as healthy as possible. Take over-the-counter and prescription medicines only as told by your doctor. If you smoke, stop. Smoking makes the problem worse. This information is not intended to replace advice given to you by your health care provider. Make sure you discuss any questions you have with your health care  provider. Document Revised: 11/15/2019 Document Reviewed: 11/15/2019 Elsevier Patient Education  [place].

## 2021-09-24 NOTE — Progress Notes (Signed)
[facility] [address] [place], [place] [address]  Pulmonary Sleep Medicine   Office Visit Note  Patient Name: [name] Hollenbach Topel Jr. DOB: October 05, 1945 MRN [id number]  Date of Service: 09/24/2021  Complaints/HPI: COPD. Doing well overall. He is on oxygen 2lpm which he is using all the time. He is using trelegy and occasional albuterol. He has some SOB with exertion. Denies having chest pain. No fevers or chills. NOted. As far as the OSA he is not using any machine. He states he refused to get a CPAP and does not have a machine. Notes snoring and same symptoms  ROS  General: (-) fever, (-) chills, (-) night sweats, (-) weakness Skin: (-) rashes, (-) itching,. Eyes: (-) visual changes, (-) redness, (-) itching. Nose and Sinuses: (-) nasal stuffiness or itchiness, (-) postnasal drip, (-) nosebleeds, (-) sinus trouble. Mouth and Throat: (-) sore throat, (-) hoarseness. Neck: (-) swollen glands, (-) enlarged thyroid, (-) neck pain. Respiratory: - cough, (-) bloody sputum, + shortness of breath, - wheezing. Cardiovascular: - ankle swelling, (-) chest pain. Lymphatic: (-) lymph node enlargement. Neurologic: (-) numbness, (-) tingling. Psychiatric: (-) anxiety, (-) depression   Current Medication: Outpatient Encounter Medications as of 09/24/2021  Medication Sig   acetaminophen (TYLENOL) 325 MG tablet Take 650 mg by mouth every 6 (six) hours as needed for mild pain or moderate pain.   albuterol (VENTOLIN HFA) 108 (90 Base) MCG/ACT inhaler Inhale into the lungs every 6 (six) hours as needed for wheezing or shortness of breath.   apixaban (ELIQUIS) 5 MG TABS tablet Take 5 mg by mouth 2 (two) times daily.   DULoxetine (CYMBALTA) 30 MG capsule Take 30 mg by mouth 2 (two) times daily.   fenofibrate 160 MG tablet Take 160 mg by mouth daily.   Fluticasone-Umeclidin-Vilant (TRELEGY ELLIPTA) 100-62.5-25 MCG/ACT AEPB Inhale 1 puff into the lungs daily.   furosemide (LASIX) 40 MG tablet Take 1  tablet (40 mg total) by mouth daily as needed for fluid (if weight up by 3 lbs).   guaiFENesin (ROBITUSSIN) 100 MG/5ML liquid Take 5 mLs by mouth every 4 (four) hours as needed for cough or to loosen phlegm.   HYDROcodone-acetaminophen (NORCO/VICODIN) 5-325 MG tablet Take 1 tablet by mouth every 6 (six) hours as needed for moderate pain.   irbesartan (AVAPRO) 150 MG tablet Take 150 mg by mouth daily.   metFORMIN (GLUCOPHAGE) 500 MG tablet Take 500 mg by mouth daily.   metoprolol succinate (TOPROL-XL) 100 MG 24 hr tablet Take 1 tablet (100 mg total) by mouth daily. Take with or immediately following a meal.   simvastatin (ZOCOR) 40 MG tablet Take 40 mg by mouth at bedtime.   Vitamin D, Ergocalciferol, (DRISDOL) 1.25 MG (50000 UNIT) CAPS capsule Take 1 capsule (50,000 Units total) by mouth every 7 (seven) days.   No facility-administered encounter medications on file as of 09/24/2021.    Surgical History: Past Surgical History:  Procedure Laterality Date   BACK SURGERY     CARDIAC CATHETERIZATION     COLONOSCOPY WITH PROPOFOL N/A 11/10/2018   Procedure: COLONOSCOPY WITH PROPOFOL;  Surgeon: Robert Bellow, MD;  Location: [facility] ENDOSCOPY;  Service: Endoscopy;  Laterality: N/A;   CORONARY ARTERY BYPASS GRAFT     (2010 @ [facility]   EYE SURGERY     ?? BLEPHOPLASTY   HAND SURGERY     CRUSHING INJURY---1966  HAS ABOUT 6 SURGERIES THEN   HAND SURGERY Right    HERNIA REPAIR  BILATERAL   LUMBAR FUSION  11/25/2011   POSTERIOR    Medical History: Past Medical History:  Diagnosis Date   Arthritis    Coronary artery disease    Depression    STARTED AFTER  OPEN HEART SURG.   Diabetes mellitus without complication ([place])    Borderline   Foot pain, bilateral    Gout    Hypertension    Insomnia 10/27/2012   Lumbosacral spinal stenosis    Myocardial infarction ([place])    Neuropathy    FIBRO        SEES  DR. Raliegh Ip  [place]   Restless leg syndrome    Stroke [facility])    2008  RIGHT SIDE...HAD FOR A  COUPLE OF DAYS AND THEN WENT AWAY   Stroke ([place])    WENT TO [place]  2008    Family History: Family History  Problem Relation Age of Onset   Diabetes Mother     Social History: Social History   Socioeconomic History   Marital status: Married    Spouse name: Not on file   Number of children: Not on file   Years of education: Not on file   Highest education level: Not on file  Occupational History   Not on file  Tobacco Use   Smoking status: Former    Packs/day: 1.00    Years: 30.00    Total pack years: 30.00    Types: Cigarettes    Start date: 01/21/1968    Quit date: 01/20/2006    Years since quitting: 15.6   Smokeless tobacco: Former    Quit date: 01/25/2006  Vaping Use   Vaping Use: Never used  Substance and Sexual Activity   Alcohol use: Yes    Alcohol/week: 12.0 standard drinks of alcohol    Types: 4 Glasses of wine, 8 Shots of liquor per week    Comment: occasional   Drug use: No   Sexual activity: Not on file  Other Topics Concern   Not on file  Social History Narrative   Not on file   Social Determinants of Health   Financial Resource Strain: Not on file  Food Insecurity: Not on file  Transportation Needs: Not on file  Physical Activity: Not on file  Stress: Not on file  Social Connections: Not on file  Intimate Partner Violence: Not on file    Vital Signs: Blood pressure 120/74, pulse 83, temperature 98.2 F (36.8 C), resp. rate 16, height '5\' 11"'$  (1.803 m), weight 224 lb 9.6 oz (101.9 kg), SpO2 96 %.  Examination: General Appearance: The patient is well-developed, well-nourished, and in no distress. Skin: Gross inspection of skin unremarkable. Head: normocephalic, no gross deformities. Eyes: no gross deformities noted. ENT: ears appear grossly normal no exudates. Neck: Supple. No thyromegaly. No LAD. Respiratory: few rhonchi noted. Cardiovascular: Normal S1 and S2 without murmur or rub. Extremities: No cyanosis. pulses are  equal. Neurologic: Alert and oriented. No involuntary movements.  LABS: No results found for this or any previous visit (from the past 2160 hour(s)).  Radiology: CT Chest Wo Contrast  Result Date: 04/18/2021 CLINICAL DATA:  Dyspnea, cough EXAM: CT CHEST WITHOUT CONTRAST TECHNIQUE: Multidetector CT imaging of the chest was performed following the standard protocol without IV contrast. RADIATION DOSE REDUCTION: This exam was performed according to the departmental dose-optimization program which includes automated exposure control, adjustment of the mA and/or kV according to patient size and/or use of iterative reconstruction technique. COMPARISON:  11/26/2020 FINDINGS: Cardiovascular: Coronary artery  calcifications are seen. There are scattered calcifications in the thoracic aorta and its major branches. Mediastinum/Nodes: No new significant lymphadenopathy seen. Lungs/Pleura: There is interval clearing of patchy infiltrates in the left lower lobe. There is interval clearing of small bilateral pleural effusions. There is no focal pulmonary consolidation. There are small linear densities in the lingula and left lower lobe suggesting scarring or subsegmental atelectasis. There is no pneumothorax. Left hemidiaphragm is slightly elevated. Upper Abdomen: There are few scattered calcifications in the liver, possibly granulomas. Musculoskeletal: Degenerative changes are noted in the thoracic spine. IMPRESSION: There is no focal pulmonary consolidation. There is no pleural effusion or pneumothorax. Small linear densities in the lingula and left lower lobe [place] suggest scarring or subsegmental atelectasis. Extensive coronary artery calcifications are seen. Electronically Signed   By: Elmer Picker M.D.   On: 04/18/2021 08:42    No results found.  No results found.    Assessment and Plan: Patient Active Problem List   Diagnosis Date Noted   Depression 11/21/2020   Goals of care, counseling/discussion  11/20/2020   Acute respiratory failure with hypoxia and hypercapnia ([place]) 62/37/6283   Toxic metabolic encephalopathy 15/17/6160   Coronary artery disease    Hypertension    Acute bronchitis due to infection    Community acquired pneumonia    Diabetes ([place]) 01/08/2016   Hyperlipidemia 01/08/2016   Pain in limb 01/08/2016   Insomnia 10/27/2012   Peripheral neuropathy 03/09/2012   Lumbar stenosis 12/04/2011    1. Shortness of breath He is at baseline suggested getting a follow-up spirometry on him to assess any progression of disease. - Spirometry with Graph  2. Obstructive chronic bronchitis without exacerbation (HCC) COPD significant disease I have reviewed his medications he is currently on albuterol in addition to that he does take Trelegy seems to get good response to these therapies and we will continue with current inhaler regimen.  3. On supplemental oxygen therapy On supplemental oxygen we will continue with current recommendations.  4. OSA (obstructive sleep apnea) Once again discussed the importance of compliance currently not on PAP  5. Diastolic dysfunction Appears to be compensated discussed with him the link between sleep apnea and heart failure  General Counseling: I have discussed the findings of the evaluation and examination with Christean Grief.  I have also discussed any further diagnostic evaluation thatmay be needed or ordered today. Rashi verbalizes understanding of the findings of todays visit. We also reviewed his medications today and discussed drug interactions and side effects including but not limited excessive drowsiness and altered mental states. We also discussed that there is always a risk not just to him but also people around him. he has been encouraged to call the office with any questions or concerns that should arise related to todays visit.  Orders Placed This Encounter  Procedures   Spirometry with Graph    Order Specific Question:   Where should this  test be performed?    Answer:   [facility]    Order Specific Question:   Basic spirometry    Answer:   Yes    Order Specific Question:   Spirometry pre & post bronchodilator    Answer:   No     Time spent: 1  I have personally obtained a history, examined the patient, evaluated laboratory and imaging results, formulated the assessment and plan and placed orders.    Allyne Gee, MD [facility] Pulmonary and Critical Care Sleep medicine

## 2022-03-25 ENCOUNTER — Ambulatory Visit: Payer: Medicare HMO | Admitting: Internal Medicine

## 2022-03-27 ENCOUNTER — Ambulatory Visit: Payer: Medicare HMO | Admitting: Nurse Practitioner

## 2022-03-27 ENCOUNTER — Encounter: Payer: Self-pay | Admitting: Nurse Practitioner

## 2022-03-27 VITALS — BP 125/75 | HR 82 | Temp 97.7°F | Resp 16 | Ht 71.0 in

## 2022-03-27 DIAGNOSIS — Z9981 Dependence on supplemental oxygen: Secondary | ICD-10-CM

## 2022-03-27 DIAGNOSIS — G4733 Obstructive sleep apnea (adult) (pediatric): Secondary | ICD-10-CM | POA: Diagnosis not present

## 2022-03-27 DIAGNOSIS — J4489 Other specified chronic obstructive pulmonary disease: Secondary | ICD-10-CM

## 2022-03-27 DIAGNOSIS — R0602 Shortness of breath: Secondary | ICD-10-CM

## 2022-03-27 MED ORDER — TRELEGY ELLIPTA 100-62.5-25 MCG/ACT IN AEPB: 1.0000 | INHALATION_SPRAY | Freq: Every day | RESPIRATORY_TRACT | 11 refills | Status: DC

## 2022-03-27 NOTE — Progress Notes (Signed)
[facility] [place], [place] [address]  Internal MEDICINE  Office Visit Note  Patient Name: [name] [name]  [id number]  [id number]  Date of Service: 03/27/2022  Chief Complaint  Patient presents with   Follow-up    pulmonary    HPI [name] [name] presents for a follow-up visit for COPD and OSA COPD -- takes trelegy daily, needs refills. Stable  On continuous oxygen at 2 LPM via [place] --  OSA -- not on CPAP -- wears oxygen at night.  Has albuterol inhaler if needed.     Current Medication: Outpatient Encounter Medications as of 03/27/2022  Medication Sig   acetaminophen (TYLENOL) 325 MG tablet Take 650 mg by mouth every 6 (six) hours as needed for mild pain or moderate pain.   albuterol (VENTOLIN HFA) 108 (90 Base) MCG/ACT inhaler Inhale into the lungs every 6 (six) hours as needed for wheezing or shortness of breath.   apixaban (ELIQUIS) 5 MG TABS tablet Take 5 mg by mouth 2 (two) times daily.   DULoxetine (CYMBALTA) 30 MG capsule Take 30 mg by mouth 2 (two) times daily.   fenofibrate 160 MG tablet Take 160 mg by mouth daily.   guaiFENesin (ROBITUSSIN) 100 MG/5ML liquid Take 5 mLs by mouth every 4 (four) hours as needed for cough or to loosen phlegm.   HYDROcodone-acetaminophen (NORCO/VICODIN) 5-325 MG tablet Take 1 tablet by mouth every 6 (six) hours as needed for moderate pain.   irbesartan (AVAPRO) 150 MG tablet Take 150 mg by mouth daily.   metFORMIN (GLUCOPHAGE) 500 MG tablet Take 500 mg by mouth daily.   metoprolol succinate (TOPROL-XL) 100 MG 24 hr tablet Take 1 tablet (100 mg total) by mouth daily. Take with or immediately following a meal.   simvastatin (ZOCOR) 40 MG tablet Take 40 mg by mouth at bedtime.   Vitamin D, Ergocalciferol, (DRISDOL) 1.25 MG (50000 UNIT) CAPS capsule Take 1 capsule (50,000 Units total) by mouth every 7 (seven) days.   [DISCONTINUED] Fluticasone-Umeclidin-Vilant (TRELEGY ELLIPTA) 100-62.5-25 MCG/ACT AEPB Inhale 1 puff into the  lungs daily.   Fluticasone-Umeclidin-Vilant (TRELEGY ELLIPTA) 100-62.5-25 MCG/ACT AEPB Inhale 1 puff into the lungs daily.   furosemide (LASIX) 40 MG tablet Take 1 tablet (40 mg total) by mouth daily as needed for fluid (if weight up by 3 lbs).   No facility-administered encounter medications on file as of 03/27/2022.    Surgical History: Past Surgical History:  Procedure Laterality Date   BACK SURGERY     CARDIAC CATHETERIZATION     COLONOSCOPY WITH PROPOFOL N/A 11/10/2018   Procedure: COLONOSCOPY WITH PROPOFOL;  Surgeon: Robert Bellow, MD;  Location: [facility] ENDOSCOPY;  Service: Endoscopy;  Laterality: N/A;   CORONARY ARTERY BYPASS GRAFT     (2010 @ [facility]     ?? BLEPHOPLASTY   HAND SURGERY     CRUSHING INJURY---1966  HAS ABOUT 6 SURGERIES THEN   HAND SURGERY Right    HERNIA REPAIR     BILATERAL   LUMBAR FUSION  11/25/2011   POSTERIOR    Medical History: Past Medical History:  Diagnosis Date   Arthritis    Coronary artery disease    Depression    STARTED AFTER  OPEN HEART SURG.   Diabetes mellitus without complication ([place])    Borderline   Foot pain, bilateral    Gout    Hypertension    Insomnia 10/27/2012   Lumbosacral spinal stenosis    Myocardial infarction ([place])  Neuropathy    FIBRO        SEES  DR. Raliegh Ip  WILLIS   Restless leg syndrome    Stroke [facility])    2008  RIGHT SIDE...HAD FOR A COUPLE OF DAYS AND THEN WENT AWAY   Stroke ([place])    WENT TO [place]  2008    Family History: Family History  Problem Relation Age of Onset   Diabetes Mother     Social History   Socioeconomic History   Marital status: Married    Spouse name: Not on file   Number of children: Not on file   Years of education: Not on file   Highest education level: Not on file  Occupational History   Not on file  Tobacco Use   Smoking status: Former    Packs/day: 1.00    Years: 30.00    Total pack years: 30.00    Types: Cigarettes    Start date: 01/21/1968    Quit  date: 01/20/2006    Years since quitting: 16.1   Smokeless tobacco: Former    Quit date: 01/25/2006  Vaping Use   Vaping Use: Never used  Substance and Sexual Activity   Alcohol use: Yes    Alcohol/week: 12.0 standard drinks of alcohol    Types: 4 Glasses of wine, 8 Shots of liquor per week    Comment: occasional   Drug use: No   Sexual activity: Not on file  Other Topics Concern   Not on file  Social History Narrative   Not on file   Social Determinants of Health   Financial Resource Strain: Not on file  Food Insecurity: Not on file  Transportation Needs: Not on file  Physical Activity: Not on file  Stress: Not on file  Social Connections: Not on file  Intimate Partner Violence: Not on file      Review of Systems  Constitutional:  Positive for fatigue.  HENT: Negative.    Respiratory:  Positive for cough, chest tightness, shortness of breath and wheezing.   Cardiovascular: Negative.  Negative for chest pain and palpitations.  Genitourinary: Negative.   Musculoskeletal: Negative.   Neurological: Negative.     Vital Signs: BP 125/75   Pulse 82   Temp 97.7 F (36.5 C)   Resp 16   Ht '5\' 11"'$  (1.803 m)   SpO2 99% Comment: 2L  BMI 31.33 kg/m    Physical Exam Vitals reviewed.  Constitutional:      General: He is awake. He is not in acute distress.    Appearance: Normal appearance. He is not ill-appearing.     Interventions: Nasal cannula in place.  HENT:     Head: Normocephalic and atraumatic.  Eyes:     Pupils: Pupils are equal, round, and reactive to light.  Cardiovascular:     Rate and Rhythm: Normal rate and regular rhythm.  Pulmonary:     Effort: Accessory muscle usage present. No respiratory distress.     Breath sounds: Normal air entry. Examination of the right-upper field reveals wheezing. Examination of the left-upper field reveals wheezing. Examination of the right-middle field reveals wheezing. Examination of the left-middle field reveals wheezing.  Examination of the right-lower field reveals decreased breath sounds. Examination of the left-lower field reveals decreased breath sounds. Decreased breath sounds and wheezing present.     Comments: On oxygen in office at 2 LPM Neurological:     Mental Status: He is alert and oriented to person, place, and time.  Psychiatric:  Mood and Affect: Mood normal.        Behavior: Behavior normal. Behavior is cooperative.        Assessment/Plan: 1. Obstructive chronic bronchitis without exacerbation Continue trelegy as prescribed.  - Fluticasone-Umeclidin-Vilant (TRELEGY ELLIPTA) 100-62.5-25 MCG/ACT AEPB; Inhale 1 puff into the lungs daily.  Dispense: 1 each; Refill: 11  2. Shortness of breath Continue prn albuterol inhaler for SOB and wheezing  3. On supplemental oxygen therapy Continuous at 2 LPM via [place]  4. OSA (obstructive sleep apnea) Does not use CPAP. Wears oxygen 2 LPM   General Counseling: [name] [name] understanding of the findings of todays visit and agrees with plan of treatment. I have discussed any further diagnostic evaluation that Giraldo be needed or ordered today. We also reviewed his medications today. he has been encouraged to call the office with any questions or concerns that should arise related to todays visit.    No orders of the defined types were placed in this encounter.   Meds ordered this encounter  Medications   Fluticasone-Umeclidin-Vilant (TRELEGY ELLIPTA) 100-62.5-25 MCG/ACT AEPB    Sig: Inhale 1 puff into the lungs daily.    Dispense:  1 each    Refill:  11    Return in about 6 months (around 09/27/2022) for F/U, pulmonary/sleep with [facility] or [facility]. .   Total time spent:30 Minutes Time spent includes review of chart, medications, test results, and follow up plan with the patient.   [place] Controlled Substance Database was reviewed by me.  This patient was seen by Jonetta Osgood, FNP-C in collaboration with Dr. Clayborn Bigness as a part of  collaborative care agreement.   Fuad Forget R. Valetta Fuller, MSN, FNP-C Internal medicine

## 2022-06-06 ENCOUNTER — Other Ambulatory Visit: Payer: Self-pay

## 2022-06-06 DIAGNOSIS — J4489 Other specified chronic obstructive pulmonary disease: Secondary | ICD-10-CM

## 2022-06-06 MED ORDER — TRELEGY ELLIPTA 100-62.5-25 MCG/ACT IN AEPB: 1.0000 | INHALATION_SPRAY | Freq: Every day | RESPIRATORY_TRACT | 6 refills | Status: DC

## 2022-08-27 DIAGNOSIS — G20A1 Parkinson's disease without dyskinesia, without mention of fluctuations: Secondary | ICD-10-CM | POA: Insufficient documentation

## 2022-09-29 ENCOUNTER — Telehealth: Payer: Self-pay | Admitting: Nurse Practitioner

## 2022-09-29 ENCOUNTER — Ambulatory Visit: Payer: Medicare HMO | Admitting: Nurse Practitioner

## 2022-09-29 NOTE — Telephone Encounter (Signed)
Called pt to reschedule missed appt on 09/29/22

## 2022-10-16 ENCOUNTER — Encounter: Payer: Self-pay | Admitting: Nurse Practitioner

## 2022-10-16 ENCOUNTER — Ambulatory Visit: Payer: Medicare HMO | Admitting: Nurse Practitioner

## 2022-10-16 ENCOUNTER — Telehealth: Payer: Self-pay | Admitting: Nurse Practitioner

## 2022-10-16 VITALS — BP 110/60 | HR 97 | Temp 97.6°F | Resp 16

## 2022-10-16 DIAGNOSIS — G4733 Obstructive sleep apnea (adult) (pediatric): Secondary | ICD-10-CM | POA: Diagnosis not present

## 2022-10-16 DIAGNOSIS — Z9981 Dependence on supplemental oxygen: Secondary | ICD-10-CM | POA: Diagnosis not present

## 2022-10-16 DIAGNOSIS — J4489 Other specified chronic obstructive pulmonary disease: Secondary | ICD-10-CM | POA: Diagnosis not present

## 2022-10-16 NOTE — Progress Notes (Signed)
[facility] [address] [place], [place] [address]  Internal MEDICINE  Office Visit Note  Patient Name: [name] [name]  [id number]  [id number]  Date of Service: 10/16/2022  Chief Complaint  Patient presents with   Follow-up    HPI [name] [name] presents for a follow-up visit for COPD, oxygen use, and OSA.  COPD -- still taking trelegy, doing well, has niot needed rescue inhaler  Has been on continuous oxygen udring the daytime as well as at night. Has taken oxygen off at home sometimes and done well with no drop in oxygen saturation, wants to come off oxygen but is unable to perform a 6 minute walk OSA -- not on CPAP but wear oxygen at night,  Room air x10 mintues during office visit today. doing leg and arm exercises in the chair, oxygen was 94%-98%, and did not drop any lower. Will perform overnight oximetry testing.    Current Medication: Outpatient Encounter Medications as of 10/16/2022  Medication Sig   acetaminophen (TYLENOL) 325 MG tablet Take 650 mg by mouth every 6 (six) hours as needed for mild pain or moderate pain.   albuterol (VENTOLIN HFA) 108 (90 Base) MCG/ACT inhaler Inhale into the lungs every 6 (six) hours as needed for wheezing or shortness of breath.   apixaban (ELIQUIS) 5 MG TABS tablet Take 5 mg by mouth 2 (two) times daily.   DULoxetine (CYMBALTA) 30 MG capsule Take 30 mg by mouth 2 (two) times daily.   fenofibrate 160 MG tablet Take 160 mg by mouth daily.   Fluticasone-Umeclidin-Vilant (TRELEGY ELLIPTA) 100-62.5-25 MCG/ACT AEPB Inhale 1 puff into the lungs daily.   guaiFENesin (ROBITUSSIN) 100 MG/5ML liquid Take 5 mLs by mouth every 4 (four) hours as needed for cough or to loosen phlegm.   HYDROcodone-acetaminophen (NORCO/VICODIN) 5-325 MG tablet Take 1 tablet by mouth every 6 (six) hours as needed for moderate pain.   irbesartan (AVAPRO) 150 MG tablet Take 150 mg by mouth daily.   metFORMIN (GLUCOPHAGE) 500 MG tablet Take 500 mg by mouth daily.    metoprolol succinate (TOPROL-XL) 100 MG 24 hr tablet Take 1 tablet (100 mg total) by mouth daily. Take with or immediately following a meal.   simvastatin (ZOCOR) 40 MG tablet Take 40 mg by mouth at bedtime.   Vitamin D, Ergocalciferol, (DRISDOL) 1.25 MG (50000 UNIT) CAPS capsule Take 1 capsule (50,000 Units total) by mouth every 7 (seven) days.   buPROPion (WELLBUTRIN) 75 MG tablet Take 75 mg by mouth every morning.   Carbidopa-Levodopa ER (SINEMET CR) 25-100 MG tablet controlled release Take 2 tablets by mouth 2 (two) times daily.   furosemide (LASIX) 40 MG tablet Take 1 tablet (40 mg total) by mouth daily as needed for fluid (if weight up by 3 lbs).   No facility-administered encounter medications on file as of 10/16/2022.    Surgical History: Past Surgical History:  Procedure Laterality Date   BACK SURGERY     CARDIAC CATHETERIZATION     COLONOSCOPY WITH PROPOFOL N/A 11/10/2018   Procedure: COLONOSCOPY WITH PROPOFOL;  Surgeon: Earline Mayotte, MD;  Location: [facility] ENDOSCOPY;  Service: Endoscopy;  Laterality: N/A;   CORONARY ARTERY BYPASS GRAFT     (2010 @ [facility]   EYE SURGERY     ?? BLEPHOPLASTY   HAND SURGERY     CRUSHING INJURY---1966  HAS ABOUT 6 SURGERIES THEN   HAND SURGERY Right    HERNIA REPAIR     BILATERAL   LUMBAR FUSION  11/25/2011   POSTERIOR    Medical History: Past Medical History:  Diagnosis Date   Arthritis    Coronary artery disease    Depression    STARTED AFTER  OPEN HEART SURG.   Diabetes mellitus without complication ([facility])    Borderline   Foot pain, bilateral    Gout    Hypertension    Insomnia 10/27/2012   Lumbosacral spinal stenosis    Myocardial infarction ([facility])    Neuropathy    FIBRO        SEES  DR. Kirtland Bouchard  [name]   Restless leg syndrome    Stroke [facility])    2008  RIGHT SIDE...HAD FOR A COUPLE OF DAYS AND THEN WENT AWAY   Stroke ([facility])    WENT TO [place]  2008    Family History: Family History  Problem Relation Age of Onset   Diabetes  Mother     Social History   Socioeconomic History   Marital status: Married    Spouse name: Not on file   Number of children: Not on file   Years of education: Not on file   Highest education level: Not on file  Occupational History   Not on file  Tobacco Use   Smoking status: Former    Current packs/day: 0.00    Average packs/day: 1 pack/day for 38.0 years (38.0 ttl pk-yrs)    Types: Cigarettes    Start date: 01/21/1968    Quit date: 01/20/2006    Years since quitting: 16.7   Smokeless tobacco: Former    Quit date: 01/25/2006  Vaping Use   Vaping status: Never Used  Substance and Sexual Activity   Alcohol use: Yes    Alcohol/week: 12.0 standard drinks of alcohol    Types: 4 Glasses of wine, 8 Shots of liquor per week    Comment: occasional   Drug use: No   Sexual activity: Not on file  Other Topics Concern   Not on file  Social History Narrative   Not on file   Social Determinants of Health   Financial Resource Strain: Low Risk  (08/27/2022)   Received from [facility]   Overall Financial Resource Strain (CARDIA)    Difficulty of Paying Living Expenses: Not hard at all  Food Insecurity: No Food Insecurity (08/27/2022)   Received from [facility]   Hunger Vital Sign    Worried About Running Out of Food in the Last Year: Never true    Ran Out of Food in the Last Year: Never true  Transportation Needs: No Transportation Needs (08/27/2022)   Received from [facility] - Transportation    In the past 12 months, has lack of transportation kept you from medical appointments or from getting medications?: No    Lack of Transportation (Non-Medical): No  Physical Activity: Not on file  Stress: Not on file  Social Connections: Not on file  Intimate Partner Violence: Not on file      Review of Systems  Constitutional:  Negative for fatigue.  HENT: Negative.    Respiratory:  Negative for cough, chest tightness,  shortness of breath and wheezing.   Cardiovascular: Negative.  Negative for chest pain and palpitations.  Genitourinary: Negative.   Musculoskeletal: Negative.   Neurological:  Positive for tremors.    Vital Signs: BP 110/60   Pulse 97   Temp 97.6 F (36.4 C)   Resp 16   SpO2 97% Comment: 2L  Physical Exam Vitals reviewed.  Constitutional:      General: He is awake. He is not in acute distress.    Appearance: Normal appearance. He is not ill-appearing.     Interventions: Nasal cannula in place.  HENT:     Head: Normocephalic and atraumatic.  Eyes:     Pupils: Pupils are equal, round, and reactive to light.  Cardiovascular:     Rate and Rhythm: Normal rate and regular rhythm.  Pulmonary:     Effort: Pulmonary effort is normal. No respiratory distress.     Breath sounds: Normal breath sounds and air entry. No wheezing.     Comments: On oxygen in office at 2 LPM Neurological:     Mental Status: He is alert and oriented to person, place, and time.  Psychiatric:        Mood and Affect: Mood normal.        Behavior: Behavior normal. Behavior is cooperative.        Assessment/Plan: 1. Obstructive chronic bronchitis without exacerbation Stable with use of trelegy inhaler. Has not needed his rescue inhaler.   2. OSA (obstructive sleep apnea) Overnight oximetry test ordered to determine if patient still needed oxygen at night.  - Overnight Pulse Oximetry Study; Future  3. On supplemental oxygen therapy Maintains adequate O2 saturations but needs to be tested on room air at night so overnight oximetry study has been ordered - Overnight Pulse Oximetry Study; Future   General Counseling: ashly maltese understanding of the findings of todays visit and agrees with plan of treatment. I have discussed any further diagnostic evaluation that Liz be needed or ordered today. We also reviewed his medications today. he has been encouraged to call the office with any questions or  concerns that should arise related to todays visit.    Orders Placed This Encounter  Procedures   Overnight Pulse Oximetry Study    No orders of the defined types were placed in this encounter.   Return in about 6 months (around 04/15/2023) for F/U, pulmonary/sleep, Nyasha Rahilly.   Total time spent:30 Minutes Time spent includes review of chart, medications, test results, and follow up plan with the patient.   [place] Controlled Substance Database was reviewed by me.  This patient was seen by Sallyanne Kuster, FNP-C in collaboration with Dr. Beverely Risen as a part of collaborative care agreement.   Khailee Mick R. Tedd Sias, MSN, FNP-C Internal medicine

## 2022-10-16 NOTE — Telephone Encounter (Signed)
Notified Hunt Oris & Elease Hashimoto with [employer] of Overnight Pulseox order-Toni

## 2022-11-02 IMAGING — CT CT CERVICAL SPINE W/O CM
3 of 4 series · 12 of 33 positions shown, 14 images · non-contrast
Comparison: CT head 09/06/2006.

CLINICAL DATA: syncopal episode and hit his head on a desk.

EXAM:
CT HEAD WITHOUT CONTRAST
CT CERVICAL SPINE WITHOUT CONTRAST
TECHNIQUE: Multidetector CT imaging of the head and cervical spine was
performed following the standard protocol without intravenous
contrast. Multiplanar CT image reconstructions of the cervical spine
were also generated.

[Series 6: sagittal bone · sagittal · 0.21mm/px · 5 of 62 slices shown, 6 images]
[im 21/62  bone]
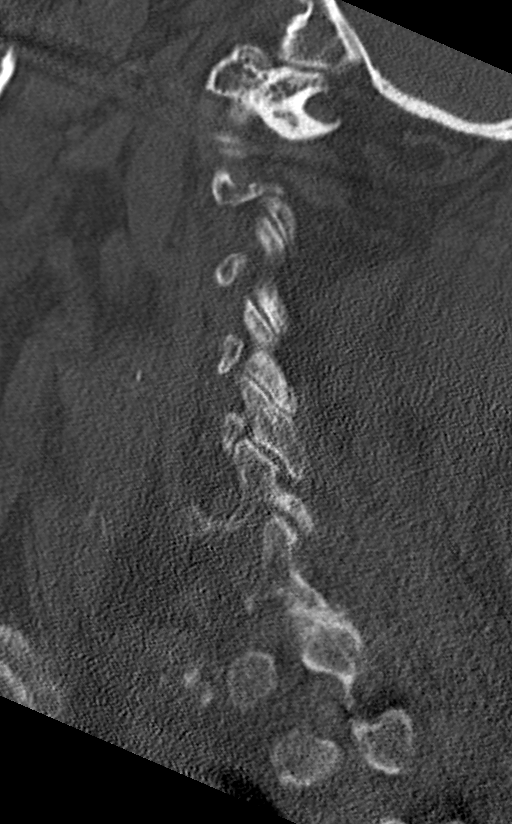
[im 26/62  bone]
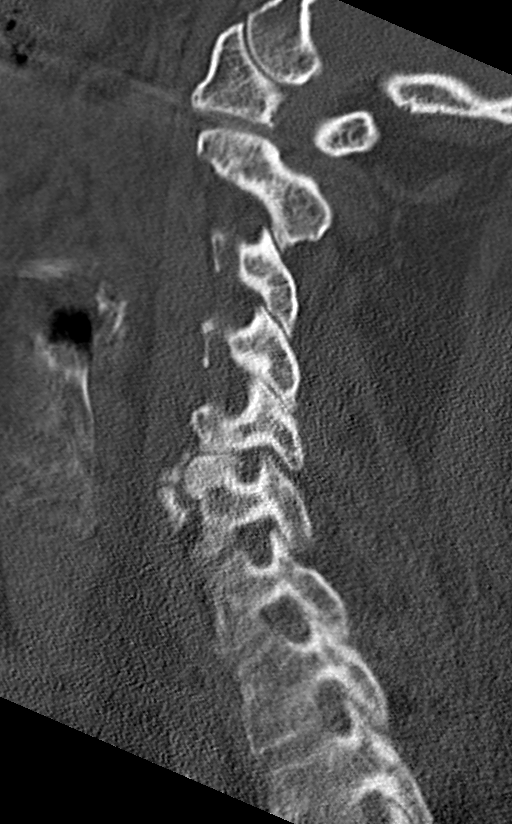
[im 31/62  soft-tissue]
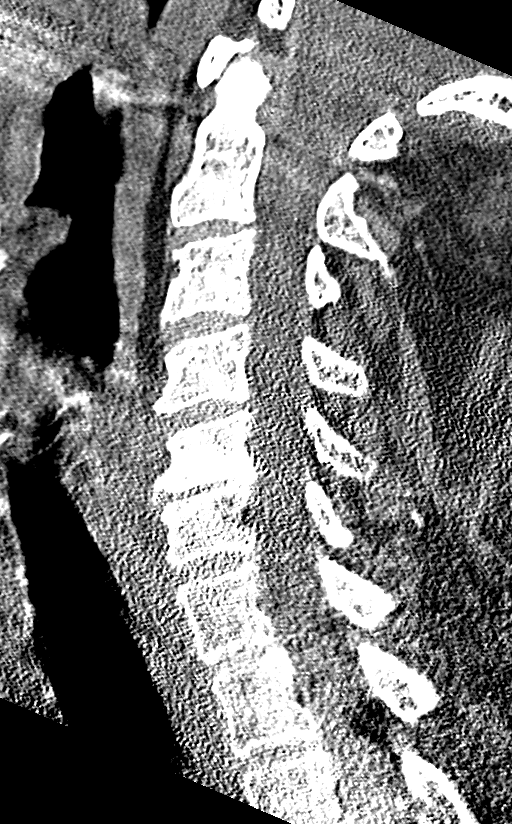
[im 31/62  bone]
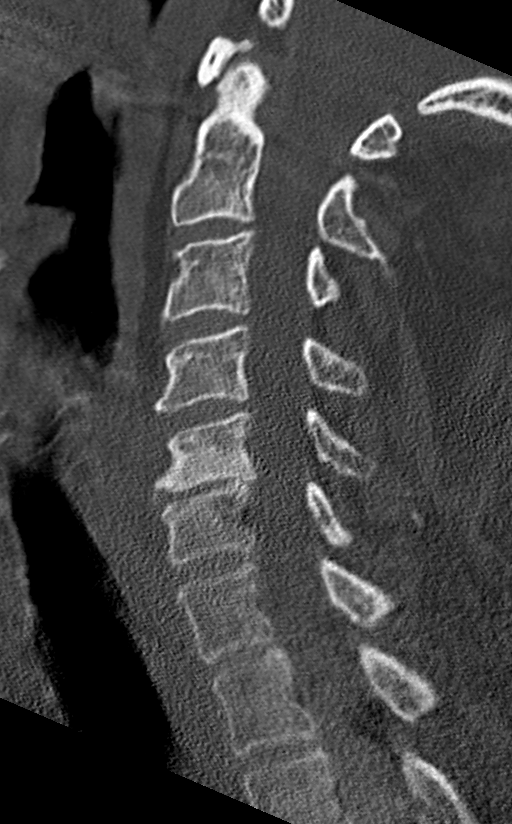
[im 36/62  bone]
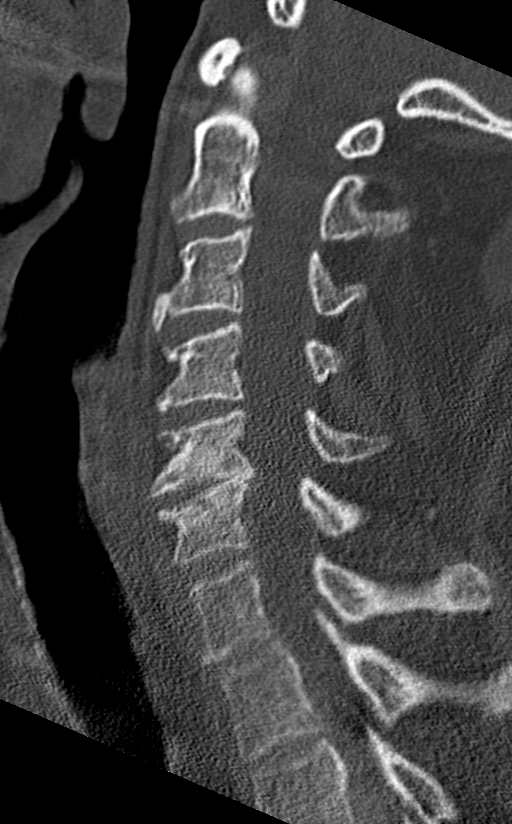
[im 41/62  bone]
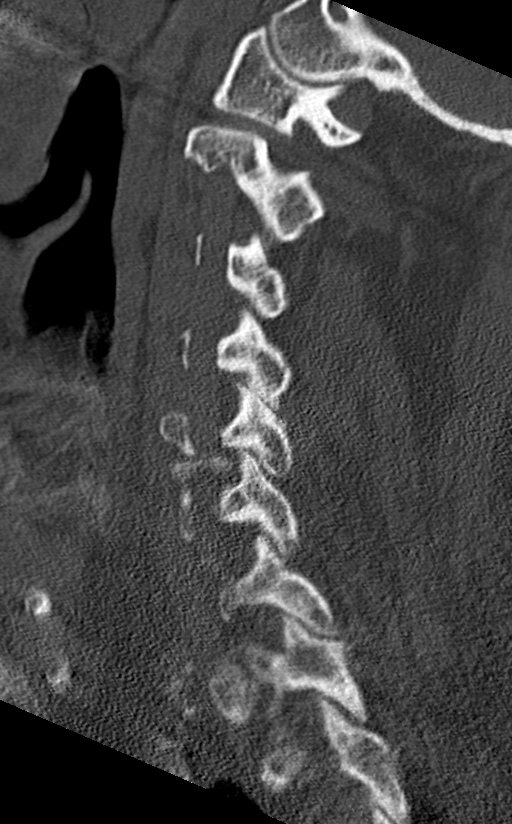

[Series 7: coronal bone · coronal · 0.24mm/px · 3 of 56 slices shown]
[im 12/56  bone]
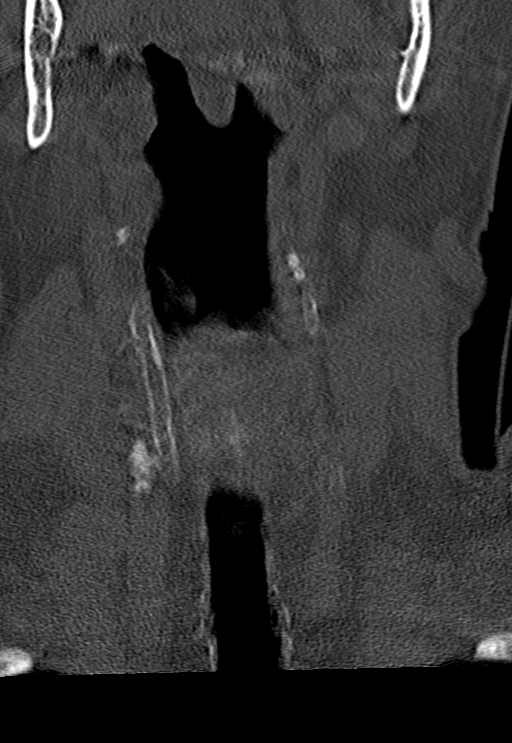
[im 23/56  bone]
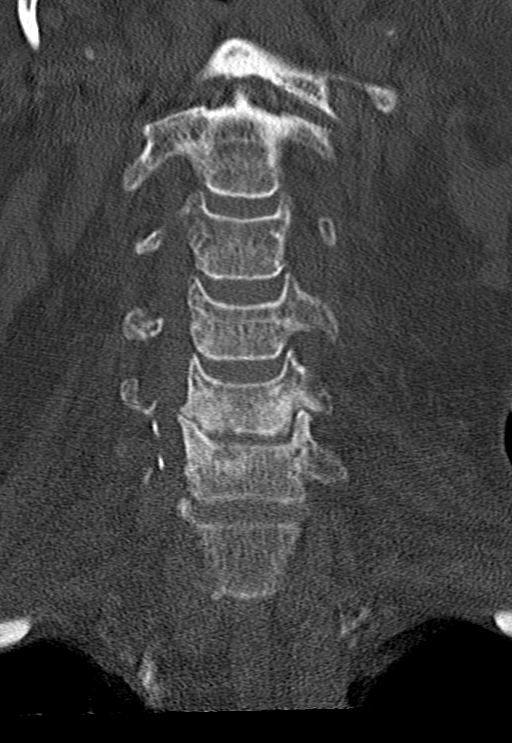
[im 34/56  bone]
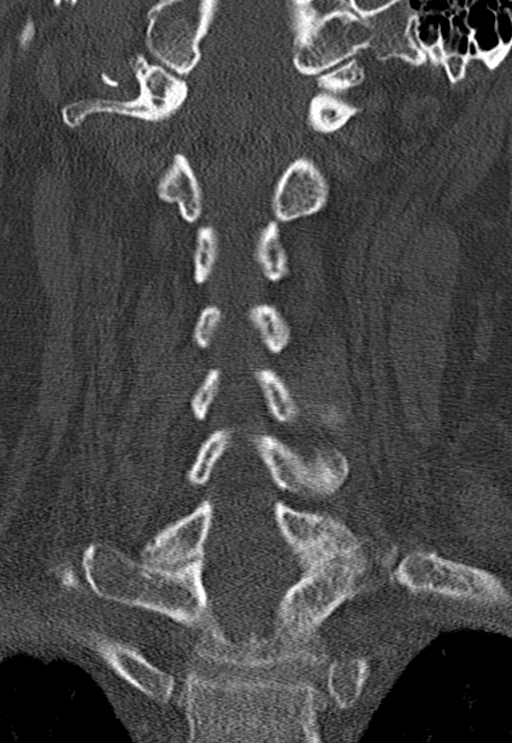

[Series 8: orthogonal bone · axial · 0.21mm/px · z∈[-302,-193]mm · 4 of 89 slices shown, 5 images]
[im 15/89  soft-tissue]
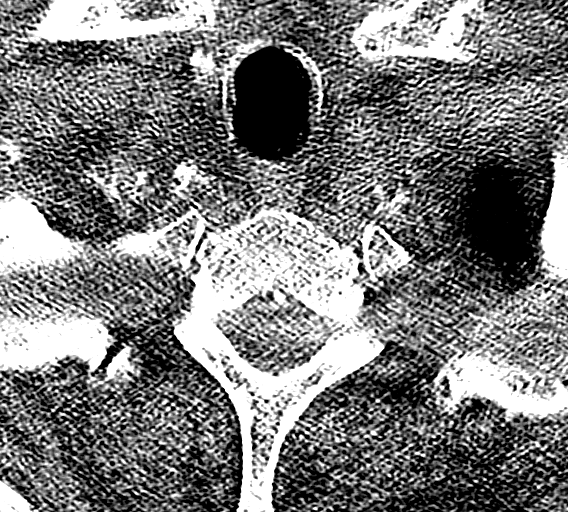
[im 15/89  bone]
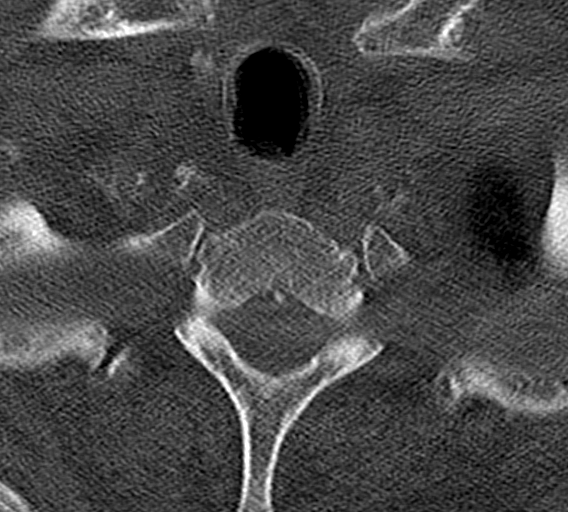
[im 30/89  bone]
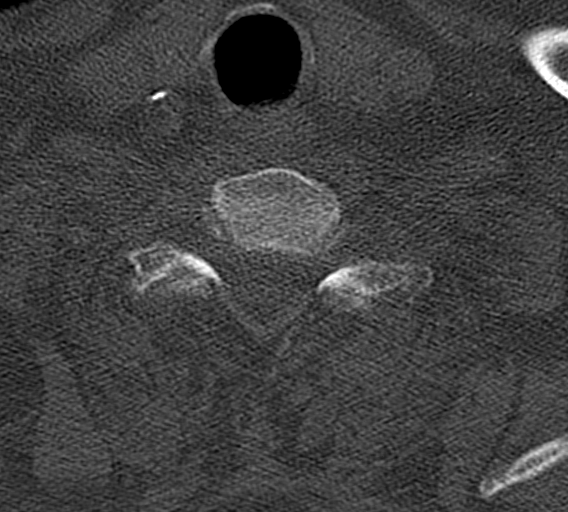
[im 59/89  bone]
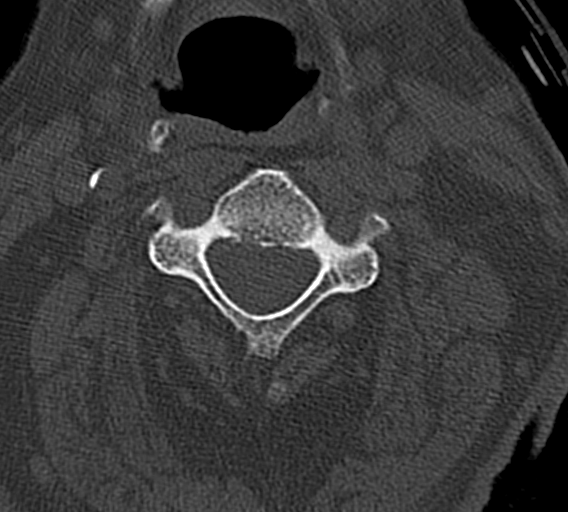
[im 74/89  bone]
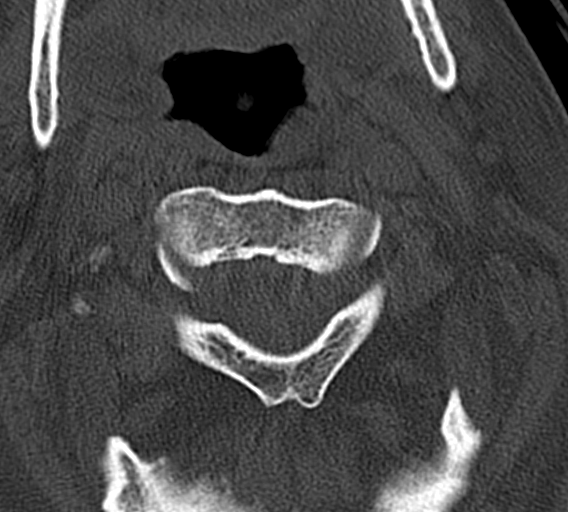

[12 of 33 positions shown; findings below may reference images not displayed]

FINDINGS: CT HEAD FINDINGS

Brain:

Left basal ganglia hypodensity similar to the cerebrospinal fluid
likely represents an old lacunar infarct ([DATE]). Similar vague
hypodensity along the periphery of the basal ganglia that likely
reflects a dilated Virchow Robin space or old lacunar infarction
([DATE]). No evidence of large-territorial acute infarction. No
parenchymal hemorrhage. No mass lesion. No extra-axial collection.

No mass effect or midline shift. No hydrocephalus. Basilar cisterns
are patent.

Vascular: No hyperdense vessel. Atherosclerotic calcifications are
present within the cavernous internal carotid arteries.

Skull: No acute fracture or focal lesion.

Sinuses/Orbits: Paranasal sinuses and mastoid air cells are clear.
The orbits are unremarkable.

Other: None.

CT CERVICAL SPINE FINDINGS

Alignment: Normal.

Skull base and vertebrae: Multilevel degenerative changes of the
spine. No acute fracture. No aggressive appearing focal osseous
lesion or focal pathologic process.

Soft tissues and spinal canal: No prevertebral fluid or swelling. No
visible canal hematoma.

Disc levels:  Maintained.

Upper chest: Unremarkable.

Other: Atherosclerotic plaque.
IMPRESSION: 1. No acute intracranial abnormality.
2. No acute displaced fracture or traumatic listhesis.

## 2022-11-02 IMAGING — CR DG CHEST 2V
1 series · 2 of 2 positions shown · non-contrast
Comparison: Cardiomegaly with left ventricular prominence.

CLINICAL DATA: Fell with chest pain.

EXAM:
CHEST - 2 VIEW

[Series 3: chest ap · 0.14mm/px · 2 of 2 slices shown]
[im 1/2]
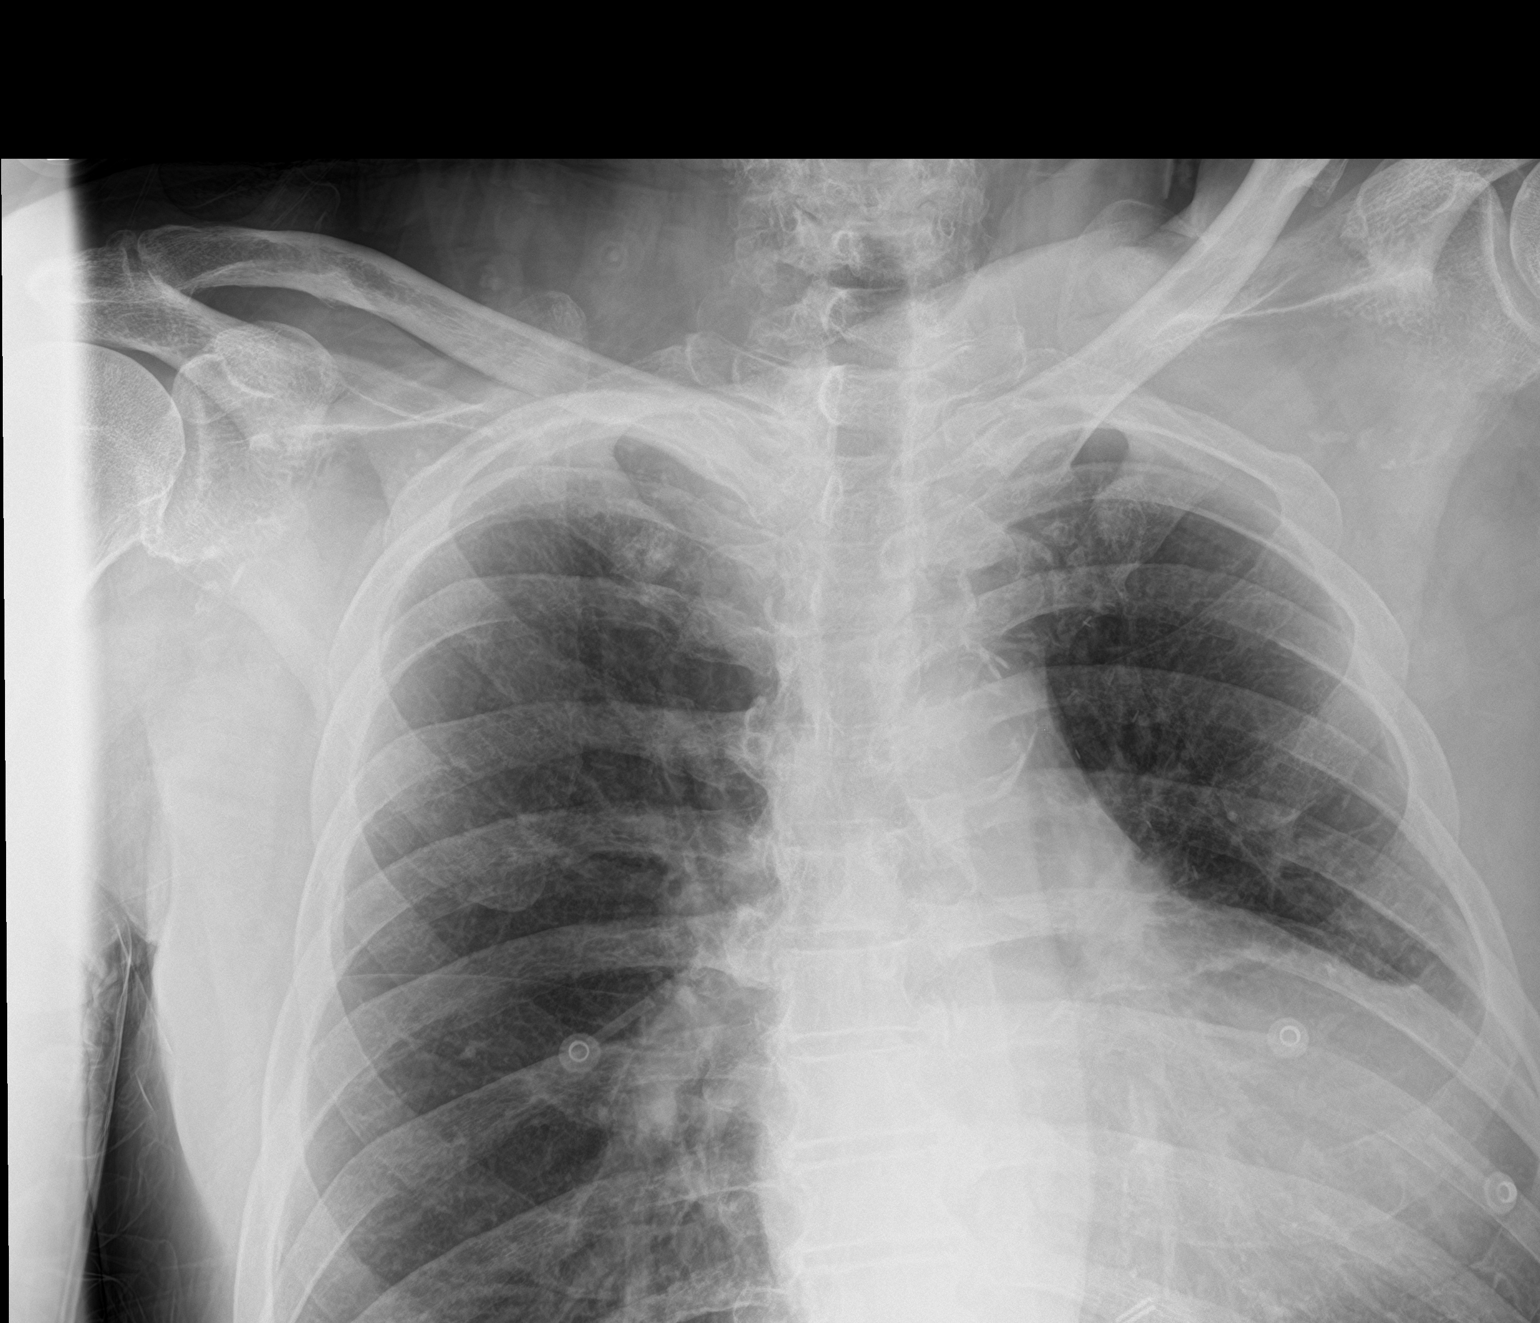
[im 2/2]
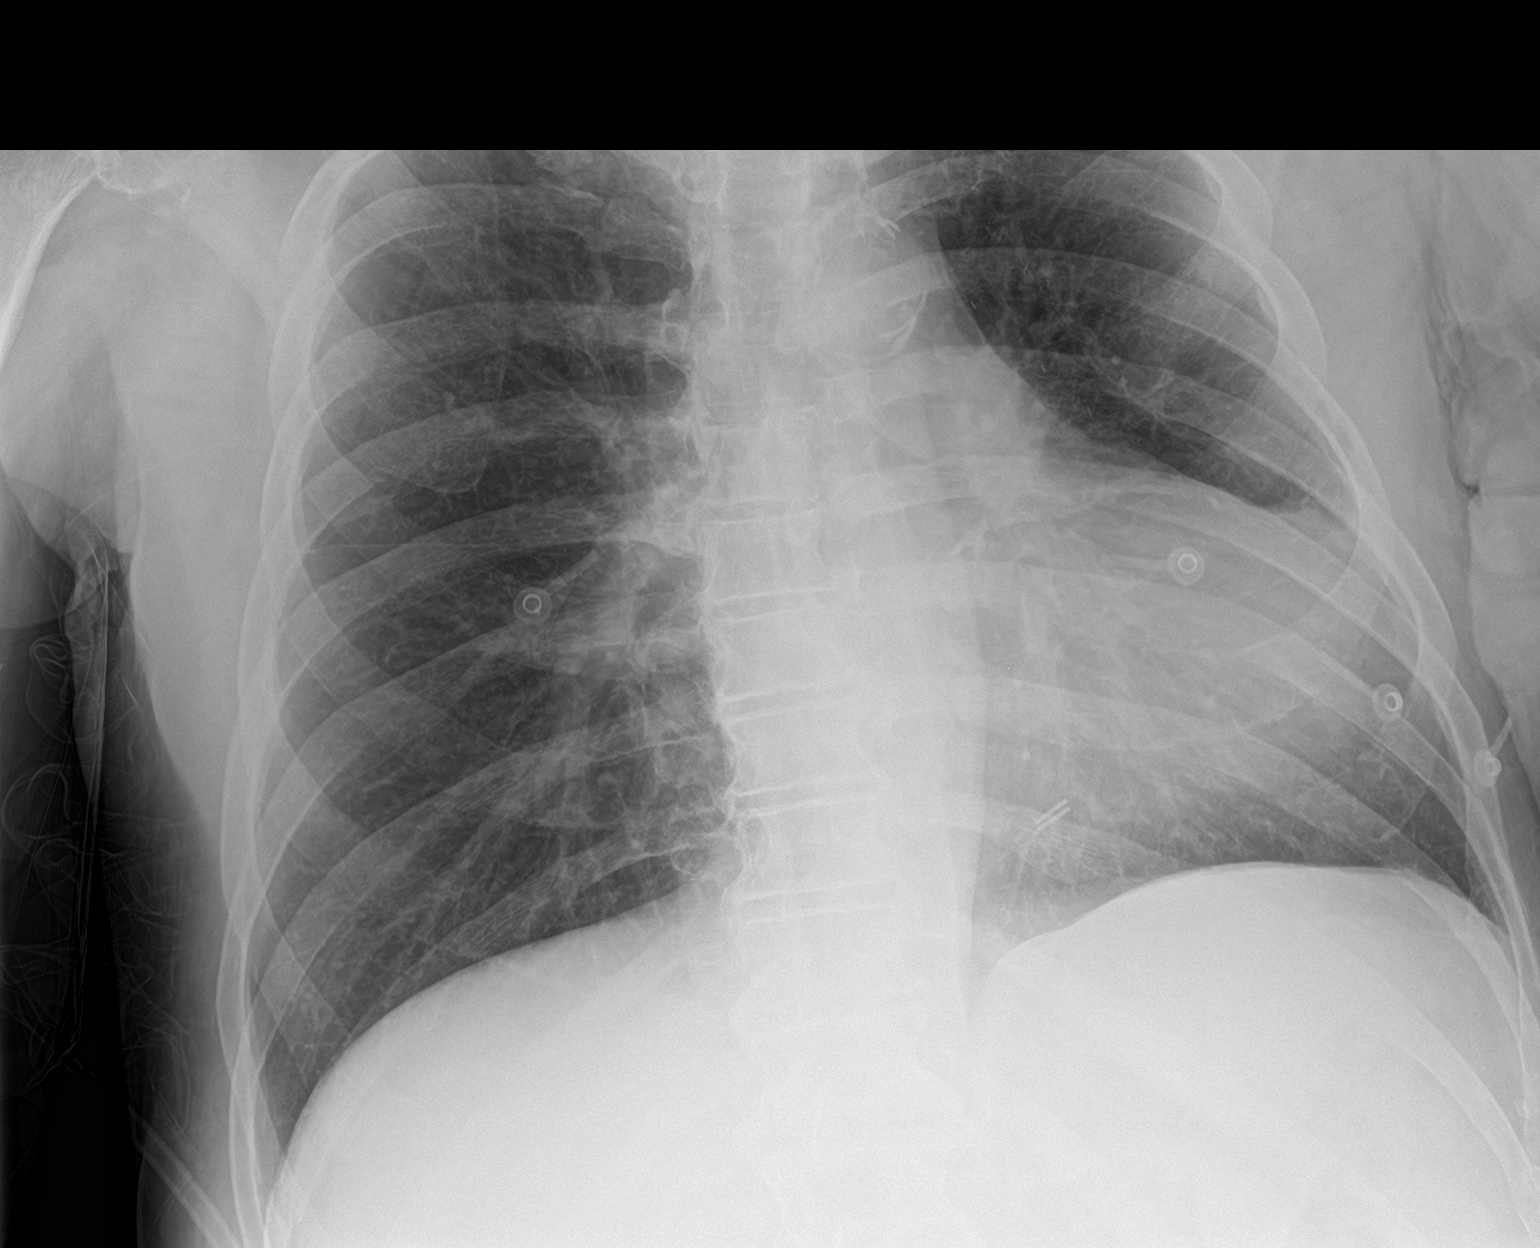

[2 of 2 positions shown; findings below may reference images not displayed]

Aortic
atherosclerotic calcification. No pulmonary edema. The lungs are
clear. No pneumothorax or hemothorax. No acute regional bone
finding.
FINDINGS: The heart size and mediastinal contours are within normal limits.
Both lungs are clear. The visualized skeletal structures are
unremarkable.
IMPRESSION: No active cardiopulmonary disease.

## 2022-11-02 IMAGING — CR DG LUMBAR SPINE 2-3V
1 series · 3 of 3 positions shown · non-contrast
Comparison: 10/29/2015

CLINICAL DATA: Fall

EXAM:
LUMBAR SPINE - 2-3 VIEW

[Series 1: dg lumbar spine 2-3 views · 0.14mm/px · 3 of 3 slices shown]
[im 1/3]
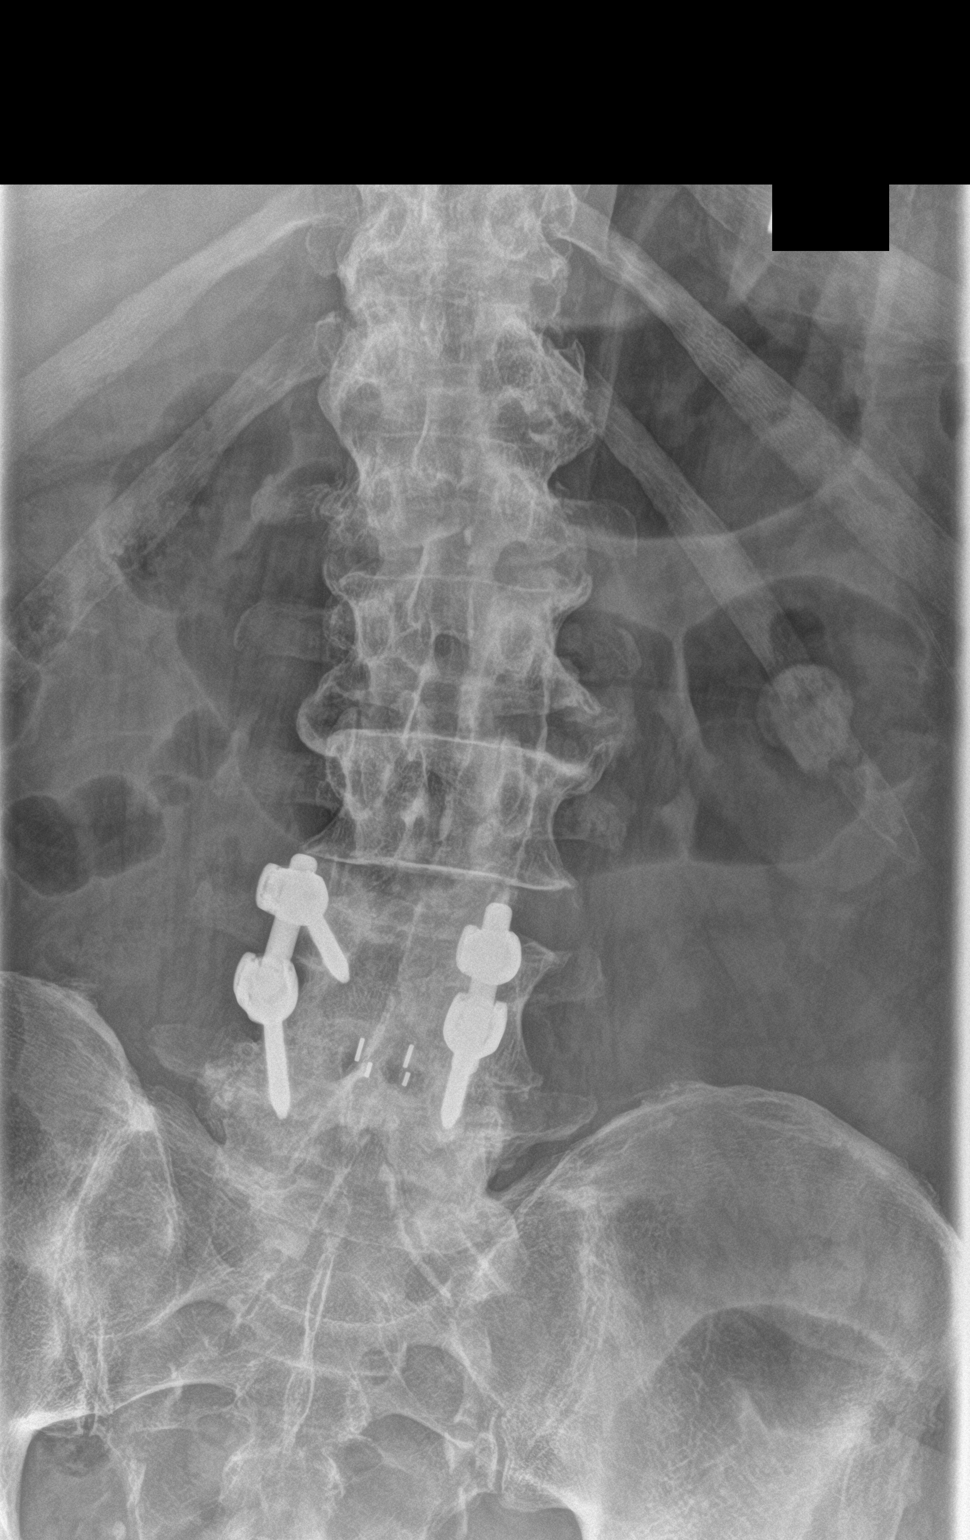
[im 2/3]
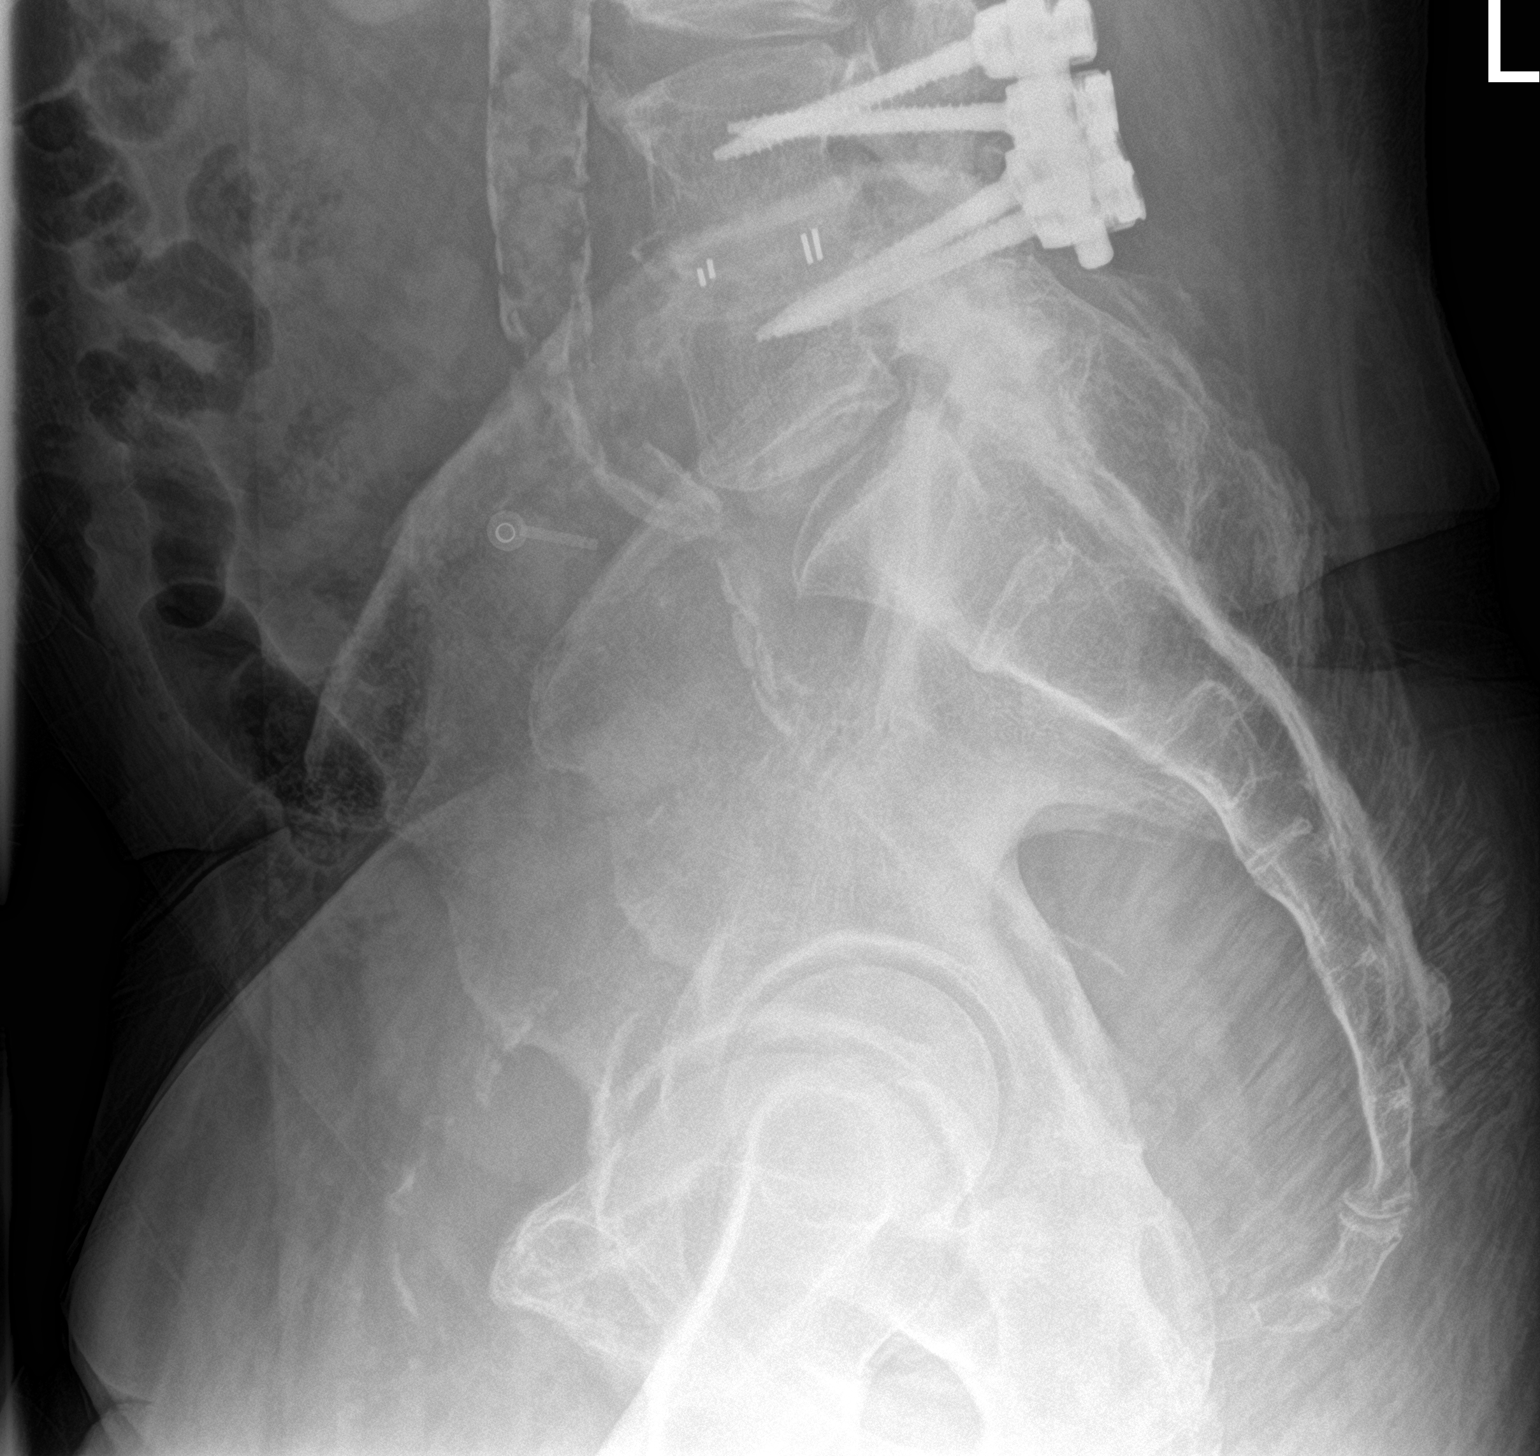
[im 3/3]
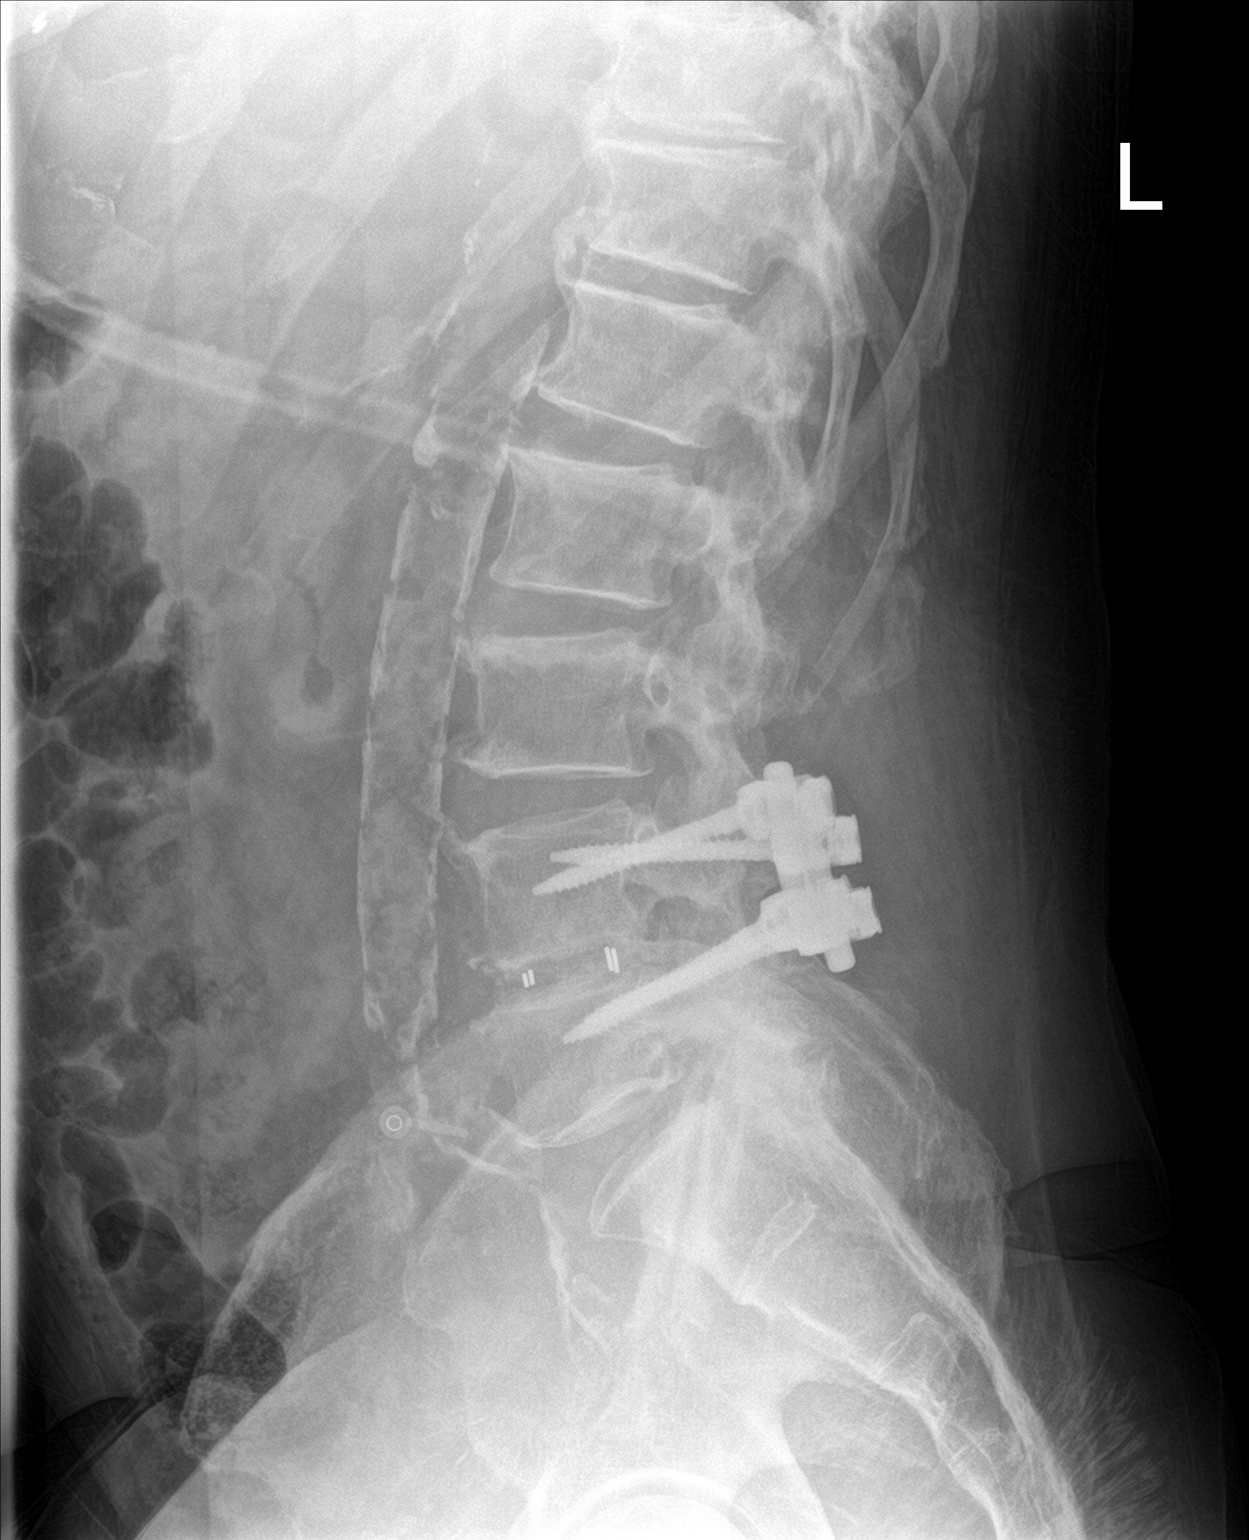

[3 of 3 positions shown; findings below may reference images not displayed]

FINDINGS: L4-5 PLIF. Multilevel mild degenerative changes. Normal alignment.
No fracture. No abnormal perihardware lucency. Atherosclerotic
calcification of the aorta.
IMPRESSION: L4-5 PLIF without adverse features.

## 2023-01-20 DIAGNOSIS — R399 Unspecified symptoms and signs involving the genitourinary system: Secondary | ICD-10-CM | POA: Diagnosis not present

## 2023-01-20 DIAGNOSIS — R8290 Unspecified abnormal findings in urine: Secondary | ICD-10-CM | POA: Diagnosis not present

## 2023-01-24 DIAGNOSIS — R0902 Hypoxemia: Secondary | ICD-10-CM | POA: Diagnosis not present

## 2023-01-24 DIAGNOSIS — G4730 Sleep apnea, unspecified: Secondary | ICD-10-CM | POA: Diagnosis not present

## 2023-02-02 ENCOUNTER — Telehealth: Payer: Self-pay | Admitting: Nurse Practitioner

## 2023-02-02 NOTE — Telephone Encounter (Signed)
 Received pulseox results. Gave to [name] [name] to review-Toni

## 2023-04-16 ENCOUNTER — Ambulatory Visit: Payer: Medicare HMO | Admitting: Nurse Practitioner

## 2023-05-12 ENCOUNTER — Ambulatory Visit: Admitting: Nurse Practitioner

## 2023-05-19 ENCOUNTER — Other Ambulatory Visit: Payer: Self-pay

## 2023-05-19 DIAGNOSIS — R0602 Shortness of breath: Secondary | ICD-10-CM

## 2023-05-21 ENCOUNTER — Ambulatory Visit: Admitting: Nurse Practitioner

## 2023-05-27 ENCOUNTER — Telehealth: Payer: Self-pay | Admitting: Internal Medicine

## 2023-05-27 NOTE — Telephone Encounter (Signed)
 Left vm to confirm 06/03/23 appointment-Toni

## 2023-06-03 ENCOUNTER — Other Ambulatory Visit: Payer: Self-pay

## 2023-06-03 ENCOUNTER — Telehealth: Payer: Self-pay | Admitting: Nurse Practitioner

## 2023-06-03 ENCOUNTER — Ambulatory Visit: Admitting: Internal Medicine

## 2023-06-03 DIAGNOSIS — R0602 Shortness of breath: Secondary | ICD-10-CM

## 2023-06-03 DIAGNOSIS — J4489 Other specified chronic obstructive pulmonary disease: Secondary | ICD-10-CM

## 2023-06-03 MED ORDER — TRELEGY ELLIPTA 100-62.5-25 MCG/ACT IN AEPB: 1.0000 | INHALATION_SPRAY | Freq: Every day | RESPIRATORY_TRACT | 6 refills | Status: AC

## 2023-06-03 NOTE — Telephone Encounter (Signed)
 Lmom that we sent med to [facility] service

## 2023-06-18 ENCOUNTER — Ambulatory Visit: Admitting: Nurse Practitioner

## 2023-06-24 NOTE — Procedures (Signed)
 [facility] [address] [place], [address]    Complete Pulmonary Function Testing Interpretation:  FINDINGS:  The forced vital capacity is severely decreased.  The FEV1 is 1.13 L which is 36% of predicted.  FEV1 FVC ratio is mildly decreased.  Postbronchodilator there is no significant improvement in the FEV1 however clinical improvement Matthis occur.  IMPRESSION:  This spirometry is consistent with severe obstructive lung disease.  Patient was not able to perform the lung volume measurements  Cordie Deters, MD [facility] Pulmonary Critical Care Medicine Sleep Medicine

## 2023-07-28 ENCOUNTER — Ambulatory Visit: Admitting: Internal Medicine

## 2023-08-11 LAB — PULMONARY FUNCTION TEST

## 2023-09-01 ENCOUNTER — Ambulatory Visit: Admitting: Internal Medicine

## 2023-12-21 DEATH — deceased

## 2024-06-15 ENCOUNTER — Encounter: Admitting: Internal Medicine
